# Patient Record
Sex: Male | Born: 1945
Health system: Southern US, Community
[De-identification: ages and names within clinical notes are randomized; demographics above are authoritative.]

## PROBLEM LIST (undated history)

## (undated) DIAGNOSIS — Z9889 Other specified postprocedural states: Secondary | ICD-10-CM

## (undated) DIAGNOSIS — S82209A Unspecified fracture of shaft of unspecified tibia, initial encounter for closed fracture: Secondary | ICD-10-CM

## (undated) DIAGNOSIS — G709 Myoneural disorder, unspecified: Secondary | ICD-10-CM

## (undated) DIAGNOSIS — S82409A Unspecified fracture of shaft of unspecified fibula, initial encounter for closed fracture: Secondary | ICD-10-CM

## (undated) DIAGNOSIS — I4510 Unspecified right bundle-branch block: Secondary | ICD-10-CM

## (undated) DIAGNOSIS — G4733 Obstructive sleep apnea (adult) (pediatric): Secondary | ICD-10-CM

## (undated) DIAGNOSIS — E785 Hyperlipidemia, unspecified: Secondary | ICD-10-CM

## (undated) DIAGNOSIS — K219 Gastro-esophageal reflux disease without esophagitis: Secondary | ICD-10-CM

## (undated) DIAGNOSIS — M1990 Unspecified osteoarthritis, unspecified site: Secondary | ICD-10-CM

## (undated) DIAGNOSIS — R112 Nausea with vomiting, unspecified: Secondary | ICD-10-CM

## (undated) DIAGNOSIS — R55 Syncope and collapse: Secondary | ICD-10-CM

## (undated) DIAGNOSIS — R001 Bradycardia, unspecified: Secondary | ICD-10-CM

## (undated) DIAGNOSIS — G1223 Primary lateral sclerosis: Secondary | ICD-10-CM

## (undated) DIAGNOSIS — K760 Fatty (change of) liver, not elsewhere classified: Secondary | ICD-10-CM

## (undated) DIAGNOSIS — I10 Essential (primary) hypertension: Secondary | ICD-10-CM

## (undated) HISTORY — DX: Gastro-esophageal reflux disease without esophagitis: K21.9

## (undated) HISTORY — DX: Unspecified right bundle-branch block: I45.10

## (undated) HISTORY — DX: Fatty (change of) liver, not elsewhere classified: K76.0

## (undated) HISTORY — DX: Obstructive sleep apnea (adult) (pediatric): G47.33

## (undated) HISTORY — DX: Syncope and collapse: R55

## (undated) HISTORY — DX: Bradycardia, unspecified: R00.1

## (undated) HISTORY — DX: Primary lateral sclerosis: G12.23

## (undated) HISTORY — DX: Essential (primary) hypertension: I10

## (undated) HISTORY — DX: Hyperlipidemia, unspecified: E78.5

## (undated) HISTORY — PX: EYE SURGERY: SHX253

---

## 1975-04-23 HISTORY — PX: SHOULDER SURGERY: SHX246

## 2001-12-21 HISTORY — PX: HERNIA REPAIR: SHX51

## 2005-09-17 ENCOUNTER — Ambulatory Visit: Payer: Self-pay | Admitting: Internal Medicine

## 2005-10-09 ENCOUNTER — Ambulatory Visit (HOSPITAL_COMMUNITY): Admission: RE | Admit: 2005-10-09 | Discharge: 2005-10-09 | Payer: Self-pay | Admitting: Orthopedic Surgery

## 2005-10-22 ENCOUNTER — Ambulatory Visit: Payer: Self-pay | Admitting: Internal Medicine

## 2005-11-27 ENCOUNTER — Encounter: Admission: RE | Admit: 2005-11-27 | Discharge: 2005-11-27 | Payer: Self-pay | Admitting: Internal Medicine

## 2006-04-10 ENCOUNTER — Ambulatory Visit (HOSPITAL_COMMUNITY): Admission: RE | Admit: 2006-04-10 | Discharge: 2006-04-10 | Payer: Self-pay | Admitting: Family Medicine

## 2008-02-09 ENCOUNTER — Encounter: Payer: Self-pay | Admitting: Family Medicine

## 2008-08-30 ENCOUNTER — Ambulatory Visit (HOSPITAL_COMMUNITY): Admission: RE | Admit: 2008-08-30 | Discharge: 2008-08-30 | Payer: Self-pay | Admitting: General Surgery

## 2008-11-20 HISTORY — PX: FOOT SURGERY: SHX648

## 2009-03-07 ENCOUNTER — Encounter: Payer: Self-pay | Admitting: Family Medicine

## 2009-08-02 ENCOUNTER — Ambulatory Visit: Payer: Self-pay | Admitting: Family Medicine

## 2009-08-02 DIAGNOSIS — G1221 Amyotrophic lateral sclerosis: Secondary | ICD-10-CM | POA: Insufficient documentation

## 2009-08-02 DIAGNOSIS — E7800 Pure hypercholesterolemia, unspecified: Secondary | ICD-10-CM | POA: Insufficient documentation

## 2009-08-02 DIAGNOSIS — I10 Essential (primary) hypertension: Secondary | ICD-10-CM | POA: Insufficient documentation

## 2009-08-02 DIAGNOSIS — Z9189 Other specified personal risk factors, not elsewhere classified: Secondary | ICD-10-CM | POA: Insufficient documentation

## 2009-08-02 DIAGNOSIS — K219 Gastro-esophageal reflux disease without esophagitis: Secondary | ICD-10-CM | POA: Insufficient documentation

## 2009-08-02 LAB — HM COLONOSCOPY

## 2009-08-09 LAB — CONVERTED CEMR LAB
ALT: 28 units/L (ref 0–53)
AST: 26 units/L (ref 0–37)
Albumin: 4.6 g/dL (ref 3.5–5.2)
BUN: 17 mg/dL (ref 6–23)
Cholesterol: 281 mg/dL — ABNORMAL HIGH (ref 0–200)
Creatinine, Ser: 1.2 mg/dL (ref 0.4–1.5)
GFR calc non Af Amer: 64.46 mL/min (ref >60)
Glucose, Bld: 99 mg/dL (ref 70–99)
Potassium: 4.5 meq/L (ref 3.5–5.1)
VLDL: 47.2 mg/dL — ABNORMAL HIGH (ref 0.0–40.0)

## 2009-10-26 ENCOUNTER — Ambulatory Visit: Payer: Self-pay | Admitting: Family Medicine

## 2009-10-30 LAB — CONVERTED CEMR LAB
ALT: 24 units/L (ref 0–53)
HDL: 38.1 mg/dL — ABNORMAL LOW (ref >39.00)
Total CHOL/HDL Ratio: 6
Triglycerides: 258 mg/dL — ABNORMAL HIGH (ref 0.0–149.0)

## 2009-11-03 ENCOUNTER — Ambulatory Visit: Payer: Self-pay | Admitting: Family Medicine

## 2009-11-03 DIAGNOSIS — R1011 Right upper quadrant pain: Secondary | ICD-10-CM

## 2010-02-09 ENCOUNTER — Ambulatory Visit: Payer: Self-pay | Admitting: Family Medicine

## 2010-02-09 DIAGNOSIS — T148XXA Other injury of unspecified body region, initial encounter: Secondary | ICD-10-CM | POA: Insufficient documentation

## 2010-02-09 LAB — CONVERTED CEMR LAB
ALT: 29 units/L (ref 0–53)
AST: 29 units/L (ref 0–37)
Basophils Relative: 1 % (ref 0.0–3.0)
Bilirubin, Direct: 0.1 mg/dL (ref 0.0–0.3)
Chloride: 108 meq/L (ref 96–112)
Eosinophils Absolute: 0.4 10*3/uL (ref 0.0–0.7)
Hemoglobin: 15 g/dL (ref 13.0–17.0)
LDL Cholesterol: 113 mg/dL — ABNORMAL HIGH (ref 0–99)
Lymphocytes Relative: 34 % (ref 12.0–46.0)
MCHC: 34.7 g/dL (ref 30.0–36.0)
MCV: 96.8 fL (ref 78.0–100.0)
Neutro Abs: 3 10*3/uL (ref 1.4–7.7)
Potassium: 4.6 meq/L (ref 3.5–5.1)
Prothrombin Time: 9.8 s (ref 9.7–11.8)
RBC: 4.46 M/uL (ref 4.22–5.81)
Total Bilirubin: 0.4 mg/dL (ref 0.3–1.2)
Total CHOL/HDL Ratio: 5

## 2010-02-13 ENCOUNTER — Encounter: Payer: Self-pay | Admitting: Family Medicine

## 2010-02-13 ENCOUNTER — Ambulatory Visit: Payer: Self-pay | Admitting: Family Medicine

## 2010-02-13 DIAGNOSIS — R55 Syncope and collapse: Secondary | ICD-10-CM

## 2010-02-13 DIAGNOSIS — I498 Other specified cardiac arrhythmias: Secondary | ICD-10-CM

## 2010-02-13 LAB — CONVERTED CEMR LAB: HDL goal, serum: 40 mg/dL

## 2010-02-15 ENCOUNTER — Encounter: Admission: RE | Admit: 2010-02-15 | Discharge: 2010-02-15 | Payer: Self-pay | Admitting: Family Medicine

## 2010-02-16 ENCOUNTER — Encounter (INDEPENDENT_AMBULATORY_CARE_PROVIDER_SITE_OTHER): Payer: Self-pay | Admitting: *Deleted

## 2010-03-01 ENCOUNTER — Ambulatory Visit: Payer: Self-pay | Admitting: Family Medicine

## 2010-03-01 DIAGNOSIS — R059 Cough, unspecified: Secondary | ICD-10-CM | POA: Insufficient documentation

## 2010-03-01 DIAGNOSIS — R05 Cough: Secondary | ICD-10-CM

## 2010-03-07 ENCOUNTER — Encounter: Payer: Self-pay | Admitting: Family Medicine

## 2010-03-26 ENCOUNTER — Telehealth: Payer: Self-pay | Admitting: Family Medicine

## 2010-03-26 DIAGNOSIS — I4510 Unspecified right bundle-branch block: Secondary | ICD-10-CM

## 2010-05-22 NOTE — Progress Notes (Signed)
Summary: wants referral to cardio  Phone Note Call from Patient Call back at Home Phone [phone]   Caller: Spouse Call For: [name] MD Summary of Call: Pt's wife states pt is ready to be referred to cardio.  This was discussed at his office visit in october.  He prefers to go to [facility] cardio in [place]. Appt after the first of february is fine. Initial call taken by: Lowella Petties CMA, AAMA,  March 26, 2010 3:57 PM  Follow-up for Phone Call        Patients number disconnected will advise when patient calls back.Consuello Masse CMA   Follow-up by: Benny Lennert CMA Duncan Dull),  March 27, 2010 10:47 AM  New Problems: RIGHT BUNDLE BRANCH BLOCK (ICD-426.4)   New Problems: RIGHT BUNDLE BRANCH BLOCK (ICD-426.4)

## 2010-05-22 NOTE — Assessment & Plan Note (Signed)
Summary: 3 M F/U HTN/DLO   Vital Signs:  Patient profile:   65 year old male Weight:      241 pounds Temp:     98.5 degrees F oral Pulse rate:   56 / minute Pulse rhythm:   regular BP sitting:   120 / 70  (left arm) Cuff size:   large  Vitals Entered By: Janee Morn CMA (November 03, 2009 8:45 AM) CC: 3 month f/u; check bruises and abd pain   History of Present Illness: HTN, well controlled on lisinopril/HCTZ  High cholesterol: LDL improved on low dose pravastatin.  No current SE.  Intermittant RUQ abdominal pain after meals.[name] last 20-30 minutes. 2-3/10...Marland Kitchenoccuring every few days.Marland KitchenMarland Kitchen3-4 times a week. Ongoing for last year...some increase in frequency. Still has gallbladder. No diarrhea, no constipation, No blood in stools.  PLS.Marland Kitchenper neuro...some peircing headaches  (neg test for temporal arteritis/trigeminal neuralgia)...started on indocin.  Problems Prior to Update: 1)  Als  (ICD-335.20) 2)  Family History Diabetes 1st Degree Relative  (ICD-V18.0) 3)  Hypercholesterolemia  (ICD-272.0) 4)  Hypertension, Mild  (ICD-401.1) 5)  Gerd  (ICD-530.81) 6)  Chickenpox, Hx of  (ICD-V15.9)  Current Medications (verified): 1)  Lisinopril-Hydrochlorothiazide 20-12.5 Mg Tabs (Lisinopril-Hydrochlorothiazide) .[name].. 1 Tab By Mouth Daily 2)  Tizanidine Hcl 4 Mg Tabs (Tizanidine Hcl) .... Once Daily 3)  Robaxin 500 Mg Tabs (Methocarbamol) .... 2 Tablets Once Daily 4)  Prilosec Otc 20 Mg Tbec (Omeprazole Magnesium) .... Once Daily 5)  Pravastatin Sodium 20 Mg Tabs (Pravastatin Sodium) .[name].. 1 Tab By Mouth Daily  Allergies (verified): No Known Drug Allergies  Past History:  Past medical, surgical, family and social histories (including risk factors) reviewed, and no changes noted (except as noted below).  Past Medical History: Reviewed history from 08/02/2009 and no changes required. Current Problems:  HYPERCHOLESTEROLEMIA (ICD-272.0) HYPERTENSION, MILD (ICD-401.1) GERD  (ICD-530.81) CHICKENPOX, HX OF (ICD-V15.9)    Past Surgical History: Reviewed history from 08/02/2009 and no changes required. repair Luxating shoulder 1977 (left) Bilateral Inguinal hernia repair 12-2001 foot surgery (left)11-2008..hammer toe and bunion.  Family History: Reviewed history from 08/02/2009 and no changes required. Family History of Arthritis Family History Diabetes 2nd degree relative Family History High cholesterol Family History Hypertension Family History of Prostate CA 1st degree relative <50 Family History of Cardiovascular disorder Dad: prostate cancer, HTN, malignant, renal failure, TIA Mom: arrythmia.Marland Kitchenatrial fibrillation? sister: HTN  Social History: Reviewed history from 08/02/2009 and no changes required. Retired Married Never Smoked Alcohol use-one beverage every 2 weeks. Drug use-no Regular exercise-yes..walking to mail box Diet: fruits and veggies, water  Review of Systems       easy bruising in last 6 months. General:  Denies fatigue, fever, and malaise. CV:  Denies chest pain or discomfort. Resp:  Denies shortness of breath. GI:  Complains of abdominal pain; denies bloody stools. GU:  Denies dysuria.  Physical Exam  General:  Overweght appearing male in NAd Mouth:  MMM Neck:  no carotid bruit or thyromegaly no cervical or supraclavicular lymphadenopathy  Lungs:  Normal respiratory effort, chest expands symmetrically. Lungs are clear to auscultation, no crackles or wheezes. Heart:  Normal rate and regular rhythm. S1 and S2 normal without gallop, murmur, click, rub or other extra sounds. Abdomen:  Bowel sounds positive,abdomen soft and non-tender without masses, organomegaly or hernias noted. Pulses:  R and L posterior tibial pulses are full and equal bilaterally  Extremities:  no edema  Neurologic:  hyperreflexia in patela, diffuse 4 1/2 /5 strength alert &  oriented X3, cranial nerves II-XII intact, sensation intact to light touch, and  sensation intact to pinprick.   Slow unsteady gait. Skin:  Intact without suspicious lesions or rashes   Impression & Recommendations:  Problem # 1:  HYPERCHOLESTEROLEMIA (ICD-272.0) Improved, but not at goal.  he is currently not interested in increasing pravastatin due to past intolerance of statins. Will continue at current dose. Increase fish oil to improved triglycerides. Recheck in 3 months. His updated medication list for this problem includes:    Pravastatin Sodium 20 Mg Tabs (Pravastatin sodium) .[name]... 1 tab by mouth daily  Labs Reviewed: SGOT: 23 (10/26/2009)   SGPT: 24 (10/26/2009)  Prior 10 Yr Risk Heart Disease: Not enough information (08/02/2009)   HDL:38.10 (10/26/2009), 39 (08/21/2009)  LDL:129 (08/21/2009)  Chol:212 (10/26/2009), 216 (08/21/2009)  Trig:258.0 (10/26/2009), 239 (08/21/2009)  Problem # 2:  HYPERTENSION, MILD (ICD-401.1) Well controlled. Continue current medication.  His updated medication list for this problem includes:    Lisinopril-hydrochlorothiazide 20-12.5 Mg Tabs (Lisinopril-hydrochlorothiazide) .[name]... 1 tab by mouth daily  BP today: 120/70 Prior BP: 120/80 (08/02/2009)  Prior 10 Yr Risk Heart Disease: Not enough information (08/02/2009)  Labs Reviewed: K+: 4.9 (08/21/2009) Creat: : 1.10 (08/21/2009)   Chol: 212 (10/26/2009)   HDL: 38.10 (10/26/2009)   LDL: 129 (08/21/2009)   TG: 258.0 (10/26/2009)  Problem # 3:  ABDOMINAL PAIN, RIGHT UPPER QUADRANT (ICD-789.01) Msot concerning for gallbladder issue. PAin bothers him minimally ands he is not current;y interested in [place] or further lab work up. he has been instructed if pain last greater than 3 hours, fever and vomiting to go to ER. Call if abdmoinal pain increase in frequecny and we will begin eval.   Complete Medication List: 1)  Lisinopril-hydrochlorothiazide 20-12.5 Mg Tabs (Lisinopril-hydrochlorothiazide) .[name].. 1 tab by mouth daily 2)  Tizanidine Hcl 4 Mg Tabs (Tizanidine hcl) .... Once  daily 3)  Robaxin 500 Mg Tabs (Methocarbamol) .... 2 tablets once daily 4)  Prilosec Otc 20 Mg Tbec (Omeprazole magnesium) .... Once daily 5)  Pravastatin Sodium 20 Mg Tabs (Pravastatin sodium) .[name].. 1 tab by mouth daily 6)  Fish Oil Oil (Fish oil)  Patient Instructions: 1)  Fish oil 2000-4000 mg divided daily 2)  DHA and EPA.Marland Kitchenomega 3... or ALA in flax seed. 3)  Continue current dose of pravastatin. 4)  Recheck fasting LIPIDS, CMET, PT, PTT, cbc diff  in 3 months Dx 272.0    5)  Please schedule a follow-up appointment in 3 months .

## 2010-05-22 NOTE — Letter (Signed)
Summary: [facility]   Imported By: Lanelle Bal 08/10/2009 08:42:31  _____________________________________________________________________  External Attachment:    Type:   Image     Comment:   External Document

## 2010-05-22 NOTE — Assessment & Plan Note (Signed)
Summary: COUGH/CLE   Vital Signs:  Patient profile:   65 year old male Weight:      250.25 pounds O2 Sat:      98 % on Room air Temp:     98.1 degrees F oral Pulse rate:   60 / minute Pulse rhythm:   regular BP sitting:   122 / 82  (left arm) Cuff size:   large  Vitals Entered By: Selena Batten Dance CMA Duncan Dull) (March 01, 2010 11:46 AM)  O2 Flow:  Room air CC: Cough x1 week (Deep/dry)   History of Present Illness: CC: cough  1 wk h/o severe hacking cough.  Very little coming up.  Deep, dry hacking.  Not yellow.  mostly clear mucous from nose.  + nsaal congestion.  + started with ST.  coughing causing abd soreness.  Tried cough drops.  Delsym doesn't help.  + chills, mild HA, attributed to sinuses.  More fatigue.  + PNDrip  No n/v/d, myalgias or arthralgias, rashes.  no fever.  appetite ok.  drinking ok.  + h/o primary lateral sclerosis (upper motor neuron).  No smokers at home.  No sick contacts at home.  does note has had intermittent cough since starting ACEI.  didn't take ACEI this am.  no h/o asthma, COPD.  hay fever as child.  Current Medications (verified): 1)  Lisinopril-Hydrochlorothiazide 20-12.5 Mg Tabs (Lisinopril-Hydrochlorothiazide) .[name].. 1 Tab By Mouth Daily 2)  Tizanidine Hcl 4 Mg Tabs (Tizanidine Hcl) .... Once Daily 3)  Robaxin 500 Mg Tabs (Methocarbamol) .... 2 Tablets Once Daily 4)  Prilosec Otc 20 Mg Tbec (Omeprazole Magnesium) .... Once Daily 5)  Pravastatin Sodium 20 Mg Tabs (Pravastatin Sodium) .[name].. 1 Tab By Mouth Daily 6)  Fish Oil  Oil (Fish Oil) .... Take 1200 Mg's Two Times A Day 7)  Indomethacin 25 Mg Caps (Indomethacin) .... Take One By Mouth Two Times A Day 8)  Cranberry 300 Mg Tabs (Cranberry) .[name].. 1 By Mouth Once Daily 9)  Vitamin D3 3000 Unit Tabs (Cholecalciferol) .[name].. 1 By Mouth Once Daily 10)  Calcium-Vitamin D 600-200 Mg-Unit Tabs (Calcium-Vitamin D) .[name].. 1 By Mouth Once Daily  Allergies (verified): No Known Drug Allergies  Past  History:  Social History: Last updated: 08/02/2009 Retired Married Never Smoked Alcohol use-one beverage every 2 weeks. Drug use-no Regular exercise-yes..walking to mail box Diet: fruits and veggies, water  Past Medical History: Current Problems:  HYPERCHOLESTEROLEMIA (ICD-272.0) HYPERTENSION, MILD (ICD-401.1) GERD (ICD-530.81) CHICKENPOX, HX OF (ICD-V15.9)  PLS (Dr. Isabelle Course)  Review of Systems       per HPI  Physical Exam  General:  Overweght appearing male in NAD Head:  Normocephalic and atraumatic without obvious abnormalities. No apparent alopecia or balding. Eyes:  No corneal or conjunctival inflammation noted. EOMI. Perrla. Ears:  TM clear Nose:  External nasal examination shows no deformity or inflammation. Nasal mucosa are pink and moist without lesions or exudates. Mouth:  MMM Neck:  no carotid bruit or thyromegaly no cervical or supraclavicular lymphadenopathy  Lungs:  Normal respiratory effort, chest expands symmetrically. Lungs are clear to auscultation, no crackles or wheezes. Heart:  Normal rate and regular rhythm. S1 and S2 normal without gallop, murmur, click, rub or other extra sounds. Abdomen:  Bowel sounds positive,abdomen soft and non-tender without masses, organomegaly or hernias noted. Pulses:  2+ rad pulses Extremities:  no pitting edema Skin:  Intact without suspicious lesions or rashes   Impression & Recommendations:  Problem # 1:  COUGH (ICD-786.2) likely viral,  possible early bronchitis.  rec supoprtive care.  to call if not improving as expected.  to return if wosrening( red flags discussed).  tussionex, if too expensive then use cheratussin.  Complete Medication List: 1)  Lisinopril-hydrochlorothiazide 20-12.5 Mg Tabs (Lisinopril-hydrochlorothiazide) .[name].. 1 tab by mouth daily 2)  Tizanidine Hcl 4 Mg Tabs (Tizanidine hcl) .... Once daily 3)  Robaxin 500 Mg Tabs (Methocarbamol) .... 2 tablets once daily 4)  Prilosec Otc 20 Mg Tbec  (Omeprazole magnesium) .... Once daily 5)  Pravastatin Sodium 20 Mg Tabs (Pravastatin sodium) .[name].. 1 tab by mouth daily 6)  Fish Oil Oil (Fish oil) .... Take 1200 mg's two times a day 7)  Indomethacin 25 Mg Caps (Indomethacin) .... Take one by mouth two times a day 8)  Cranberry 300 Mg Tabs (Cranberry) .[name].. 1 by mouth once daily 9)  Vitamin D3 3000 Unit Tabs (Cholecalciferol) .[name].. 1 by mouth once daily 10)  Calcium-vitamin D 600-200 Mg-unit Tabs (Calcium-vitamin d) .[name].. 1 by mouth once daily 11)  Cheratussin Ac 100-10 Mg/93ml Syrp (Guaifenesin-codeine) .... One teaspoon every 4-6 hours as needed cough 12)  Tussionex Pennkinetic Er 10-8 Mg/37ml Lqcr (Hydrocod polst-chlorphen polst) .... One teaspoon twice daily as needed cough  Patient Instructions: 1)  Sounds like you have a viral upper respiratory infection. 2)  Antibiotics are not needed for this.  Viral infections usually take 7-10 days to resolve.  The cough can last up to 4 weeks to go away. 3)  Use medication as prescribed: tussionex, if too expensive then cheratussin.   4)  Push fluids and plenty of rest.  Nasal saline spray. 5)  Please return if you are not improving as expected, or if you have high fevers (>101.5) or difficulty breathing. 6)  Call clinic with questions.  Pleasure to see you today!  Prescriptions: Sandria Senter ER 10-8 MG/5ML LQCR (HYDROCOD POLST-CHLORPHEN POLST) one teaspoon twice daily as needed cough  #100cc x 0   Entered and Authorized by:   Eustaquio Boyden  MD   Signed by:   Eustaquio Boyden  MD on 03/01/2010   Method used:   Print then Give to Patient   RxID:   [id number] CHERATUSSIN AC 100-10 MG/5ML SYRP (GUAIFENESIN-CODEINE) one teaspoon every 4-6 hours as needed cough  #100cc x 0   Entered and Authorized by:   Eustaquio Boyden  MD   Signed by:   Eustaquio Boyden  MD on 03/01/2010   Method used:   Print then Give to Patient   RxID:   [id number]    Orders Added: 1)  Est. Patient Level  III [84696]    Current Allergies (reviewed today): No known allergies

## 2010-05-22 NOTE — Letter (Signed)
Summary: Records from [facility] 02/21/10 - 03/07/10  Records from [facility] 02/21/10 - 03/07/10   Imported By: Maryln Gottron 03/19/2010 12:20:16  _____________________________________________________________________  External Attachment:    Type:   Image     Comment:   External Document  Appended Document: Records from [facility] 02/21/10 - 03/07/10 Notify pt that I reviewed the note sent from the [facility].Marland Kitchenif he wishes to proceed with their recommendations (unless they have already made arrangements) please have him make an appt or we can discuss at next  OV. Make next appt please.    Appended Document: Records from [facility] 02/21/10 - 03/07/10 Patients wife advised and she will have patient call when back in town for appt b/c patients mother is ill.Consuello Masse CMA

## 2010-05-22 NOTE — Assessment & Plan Note (Signed)
Summary: ROA FOR 3 MONTH FOLLOW-UP/JRR   Vital Signs:  Patient profile:   65 year old male Height:      72 inches Weight:      251.0 pounds BMI:     34.16 Temp:     98.6 degrees F oral Pulse rate:   62 / minute Pulse rhythm:   regular BP sitting:   110 / 72  (left arm) Cuff size:   large  Vitals Entered By: Benny Lennert CMA Duncan Dull) (February 13, 2010 8:17 AM)  History of Present Illness: Chief complaint 3 month follow up  High cholesterol: on same dose pravastatin (intolerant of SE on higher doses in past) and higher dose fish oil.   HTN, well controlled on current meds. Well controlled at home as well.  About 1 month ago..stood up went into bathroom.  In middle of night...does take muscle relaxants at night.  Out of balance with PLS.  No proceding symptoms..no arrythmia.   Lipid Management History:      Positive NCEP/ATP III risk factors include male age 74 years old or older, HDL cholesterol less than 40, and hypertension.  Negative NCEP/ATP III risk factors include non-tobacco-user status.     Allergies (verified): No Known Drug Allergies  Review of Systems General:  Denies fatigue and fever. CV:  Denies chest pain or discomfort, fatigue, and swelling of feet. Resp:  Denies shortness of breath. GI:  Complains of abdominal pain and nausea; denies indigestion and vomiting; Continuing RUQ. GU:  Denies dysuria.  Physical Exam  General:  Overweght appearing male in NAD Mouth:  MMM Neck:  no carotid bruit or thyromegaly no cervical or supraclavicular lymphadenopathy  Lungs:  Normal respiratory effort, chest expands symmetrically. Lungs are clear to auscultation, no crackles or wheezes. Heart:  Normal rate and regular rhythm. S1 and S2 normal without gallop, murmur, click, rub or other extra sounds. Abdomen:  Bowel sounds positive,abdomen soft and non-tender without masses, organomegaly or hernias noted. Pulses:  R and L posterior tibial pulses are full and equal  bilaterally  Extremities:  trace left pedal edema and trace right pedal edema.   Skin:  Intact without suspicious lesions or rashes Psych:  Oriented X3 and good eye contact.    Mild depressed affect due to health issues   Impression & Recommendations:  Problem # 1:  ABDOMINAL PAIN, RIGHT UPPER QUADRANT (ICD-789.01) Eval with [place] for gallstones. See last OV for deatils of symptms  Orders: Radiology Referral (Radiology)  Problem # 2:  HYPERCHOLESTEROLEMIA (ICD-272.0) Improved. LDL at goal, Triglycerides improved. Increase fish oil to 3000 mg daily. Encouraged exercise, weight loss, healthy eating habits.  His updated medication list for this problem includes:    Pravastatin Sodium 20 Mg Tabs (Pravastatin sodium) .[name]... 1 tab by mouth daily  Problem # 3:  HYPERTENSION, MILD (ICD-401.1) Well controlled. Continue current medication.  His updated medication list for this problem includes:    Lisinopril-hydrochlorothiazide 20-12.5 Mg Tabs (Lisinopril-hydrochlorothiazide) .[name]... 1 tab by mouth daily  Problem # 4:  SYNCOPE (ICD-780.2) Likly due to medications at night, orthostasis.Marland Kitchenno clear heart issues or proceeding symptms.  EKG (none for comparison) showed...H Rate 53, Diffuse low voltage, -Incomplete right bundle branch block.  Will get old EKG for comparison, but likely will need referral for eval to cardiologist.   Orders: EKG w/ Interpretation (93000)  Problem # 5:  BRADYCARDIA (ICD-427.89)  Orders: TLB-TSH (Thyroid Stimulating Hormone) (84443-TSH)  Complete Medication List: 1)  Lisinopril-hydrochlorothiazide 20-12.5 Mg Tabs (Lisinopril-hydrochlorothiazide) .[name].. 1 tab  by mouth daily 2)  Tizanidine Hcl 4 Mg Tabs (Tizanidine hcl) .... Once daily 3)  Robaxin 500 Mg Tabs (Methocarbamol) .... 2 tablets once daily 4)  Prilosec Otc 20 Mg Tbec (Omeprazole magnesium) .... Once daily 5)  Pravastatin Sodium 20 Mg Tabs (Pravastatin sodium) .[name].. 1 tab by mouth daily 6)  Fish Oil Oil (Fish  oil)  Other Orders: Flu Vaccine 26yrs + MEDICARE PATIENTS (W1191) Administration Flu vaccine - MCR (G0008)  Lipid Assessment/Plan:      Based on NCEP/ATP III, the patient's risk factor category is "2 or more risk factors and a calculated 10 year CAD risk of < 20%".  [name] lipid goals are as follows: Total cholesterol goal is 200; LDL cholesterol goal is 130; HDL cholesterol goal is 40; Triglyceride goal is 150.  His LDL cholesterol goal has been met.    Patient Instructions: 1)  INcrease fish oil to 4 cap by mouth two times a day. 2)  Please schedule a follow-up appointment in 6 months  CPX 3)  BMP prior to visit, ICD-9:  4)  Hepatic Panel prior to visit ICD-9:  5)  Lipid panel prior to visit ICD-9 :  6)  Referral Appointment Information 7)  Day/Date: 8)  Time: 9)  Place/MD: 10)  Address: 11)  Phone/Fax: 12)  Patient given appointment information. Information/Orders faxed/mailed.    Orders Added: 1)  Flu Vaccine 48yrs + MEDICARE PATIENTS [Q2039] 2)  Administration Flu vaccine - MCR [G0008] 3)  TLB-TSH (Thyroid Stimulating Hormone) [84443-TSH] 4)  Radiology Referral [Radiology] 5)  EKG w/ Interpretation [93000] 6)  Est. Patient Level IV [47829]    Current Allergies (reviewed today): No known allergies     Flu Vaccine Consent Questions     Do you have a history of severe allergic reactions to this vaccine? no    Any prior history of allergic reactions to egg and/or gelatin? no    Do you have a sensitivity to the preservative Thimersol? no    Do you have a past history of Guillan-Barre Syndrome? no    Do you currently have an acute febrile illness? no    Have you ever had a severe reaction to latex? no    Vaccine information given and explained to patient? yes    Are you currently pregnant? no    Lot Number:[id number]   Exp Date:10/20/2010   Site Given  Left Deltoid [id number]   Last Flu Vaccine:  given (01/20/2009 11:01:16 AM) Flu Vaccine Result Date:   02/13/2010 Flu Vaccine Result:  given Flu Vaccine Next Due:  1 yr Hemoccult Next Due:  Not Indicated  Appended Document: ROA FOR 3 MONTH FOLLOW-UP/JRR Notif ypt that EKG compared to last one done in 01/2008..no significant change except slower heart rate. No referral needed to cardiology at this time. call if further syncopal spells.   Appended Document: ROA FOR 3 MONTH FOLLOW-UP/JRR Patient advised via message on machine.Consuello Masse CMA

## 2010-05-22 NOTE — Miscellaneous (Signed)
Summary: med list update  Medications Added FISH OIL  OIL (FISH OIL) take 1200 mg's two times a day INDOMETHACIN 25 MG CAPS (INDOMETHACIN) take one by mouth two times a day       Clinical Lists Changes  Medications: Changed medication from FISH OIL  OIL (FISH OIL) to FISH OIL  OIL (FISH OIL) take 1200 mg's two times a day Added new medication of INDOMETHACIN 25 MG CAPS (INDOMETHACIN) take one by mouth two times a day     Prior Medications: LISINOPRIL-HYDROCHLOROTHIAZIDE 20-12.5 MG TABS (LISINOPRIL-HYDROCHLOROTHIAZIDE) 1 tab by mouth daily TIZANIDINE HCL 4 MG TABS (TIZANIDINE HCL) once daily ROBAXIN 500 MG TABS (METHOCARBAMOL) 2 tablets once daily PRILOSEC OTC 20 MG TBEC (OMEPRAZOLE MAGNESIUM) once daily PRAVASTATIN SODIUM 20 MG TABS (PRAVASTATIN SODIUM) 1 tab by mouth daily INDOMETHACIN 25 MG CAPS (INDOMETHACIN) take one by mouth two times a day Current Allergies: No known allergies

## 2010-05-22 NOTE — Assessment & Plan Note (Signed)
Summary: NEW PATIENT, EST.   Vital Signs:  Patient profile:   65 year old male Height:      72 inches Weight:      242.2 pounds BMI:     32.97 Temp:     97.9 degrees F oral Pulse rate:   68 / minute Pulse rhythm:   regular BP sitting:   120 / 80  (left arm) Cuff size:   regular  Vitals Entered By: Benny Lennert CMA Duncan Dull) (August 02, 2009 10:05 AM)  History of Present Illness: Chief complaint new patient to be established   Diagnosed with PLS in 2003.Edward Kitchenafter weakness. Sees Dr. Macario Carls at [facility]. Rheum eval negative. Ortho eval negative. Neuro eval..[facility]. [facility]..Dr. Antonietta Jewel runs. On 2 muscle relaxants for symtoms.  DOE and pain may be due to PLS.   Last saw primary MD in 02/2009 Dr. Phillips Odor Had CPX at that time..but he felt only 3 minutes spents. EKG nml then.  High cholesterol..was on welchol but felt caused abdominal pain...? mild pancreatitis. Has had mild muscle pain on simvastin in past..? but that may have been due to PLS.   Hypertension History:      He complains of dyspnea with exertion, but denies headache, chest pain, palpitations, orthopnea, PND, syncope, and side effects from treatment.  Well controlled 115/70.        Positive major cardiovascular risk factors include male age 24 years old or older, hyperlipidemia, and hypertension.  Negative major cardiovascular risk factors include non-tobacco-user status.     Preventive Screening-Counseling & Management  Alcohol-Tobacco     Smoking Status: never  Caffeine-Diet-Exercise     Does Patient Exercise: yes      Drug Use:  no.    Allergies (verified): No Known Drug Allergies  Past History:  Past Medical History: Current Problems:  HYPERCHOLESTEROLEMIA (ICD-272.0) HYPERTENSION, MILD (ICD-401.1) GERD (ICD-530.81) CHICKENPOX, HX OF (ICD-V15.9)    Past Surgical History: repair Luxating shoulder 1977 (left) Bilateral Inguinal hernia repair 12-2001 foot surgery  (left)11-2008..hammer toe and bunion.  Family History: Family History of Arthritis Family History Diabetes 2nd degree relative Family History High cholesterol Family History Hypertension Family History of Prostate CA 1st degree relative <50 Family History of Cardiovascular disorder Dad: prostate cancer, HTN, malignant, renal failure, TIA Mom: arrythmia.Edward Kitchenatrial fibrillation? sister: HTN  Social History: Retired Married Never Smoked Alcohol use-one beverage every 2 weeks. Drug use-no Regular exercise-yes..walking to mail box Diet: fruits and veggies, water Smoking Status:  never Drug Use:  no Does Patient Exercise:  yes  Physical Exam  General:  Overweihgt appearing male inNAd Ears:  External ear exam shows no significant lesions or deformities.  Otoscopic examination reveals clear canals, tympanic membranes are intact bilaterally without bulging, retraction, inflammation or discharge. Hearing is grossly normal bilaterally. Nose:  External nasal examination shows no deformity or inflammation. Nasal mucosa are pink and moist without lesions or exudates. Mouth:  MMM Neck:  no carotid bruit or thyromegaly no cervical or supraclavicular lymphadenopathy  Lungs:  Normal respiratory effort, chest expands symmetrically. Lungs are clear to auscultation, no crackles or wheezes. Heart:  Normal rate and regular rhythm. S1 and S2 normal without gallop, murmur, click, rub or other extra sounds. Abdomen:  Bowel sounds positive,abdomen soft and non-tender without masses, organomegaly or hernias noted. Pulses:  R and L posterior tibial pulses are full and equal bilaterally  Extremities:  no edema  Neurologic:  hyperreflexia in patela, diffuse 4 1/2 /5 strengthalert & oriented  X3, cranial nerves II-XII intact, sensation intact to light touch, and sensation intact to pinprick.   Slow unsteady gait. Skin:  Intact without suspicious lesions or rashes Psych:  Cognition and judgment appear intact.  Alert and cooperative with normal attention span and concentration. No apparent delusions, illusions, hallucinations   Impression & Recommendations:  Problem # 1:  ALS (ICD-335.20) PLS more specifically.Edward Kitchena varient of ALS. Sees [facility] specialist. Obtain records from previous and current MDs.  Problem # 2:  HYPERCHOLESTEROLEMIA (ICD-272.0) Due for reeval. if elevated willing to t pravastain low dose and titrate up.  Orders: TLB-BMP (Basic Metabolic Panel-BMET) (80048-METABOL) TLB-Hepatic/Liver Function Pnl (80076-HEPATIC) TLB-Lipid Panel (80061-LIPID)  Problem # 3:  HYPERTENSION, MILD (ICD-401.1) Well controlled. Continue current medication.  His updated medication list for this problem includes:    Lisinopril-hydrochlorothiazide 20-12.5 Mg Tabs (Lisinopril-hydrochlorothiazide) .Edward Frank... 1 tab by mouth daily  Complete Medication List: 1)  Lisinopril-hydrochlorothiazide 20-12.5 Mg Tabs (Lisinopril-hydrochlorothiazide) .Edward Frank.. 1 tab by mouth daily 2)  Tizanidine Hcl 4 Mg Tabs (Tizanidine hcl) .... Once daily 3)  Robaxin 500 Mg Tabs (Methocarbamol) .... 2 tablets once daily 4)  Prilosec Otc 20 Mg Tbec (Omeprazole magnesium) .... Once daily  Hypertension Assessment/Plan:      [name] hypertensive risk group is category B: At least one risk factor (excluding diabetes) with no target organ damage.  Today's blood pressure is 120/80.  His blood pressure goal is < 140/90.  Patient Instructions: 1)  Please schedule a follow-up appointment in 3 months  HTN, high cholesterol.  2)  Lipid panel , AST, ALT prior to visit ICD-9 : 272.0  Current Allergies (reviewed today): No known allergies   Flu Vaccine Result Date:  01/20/2009 Flu Vaccine Result:  given Flu Vaccine Next Due:  1 yr Pneumovax Result Date:  04/22/2008 Pneumovax Result:  given Herpes Zoster Result Date:  04/23/2003 Herpes Zoster Result:  given Flex Sig Next Due:  Not Indicated Colonoscopy Result Date:  08/20/2008 Colonoscopy  Result:  normal, tics Colonoscopy Next Due:  10 yr

## 2010-05-24 ENCOUNTER — Other Ambulatory Visit: Payer: Self-pay | Admitting: Cardiology

## 2010-05-24 ENCOUNTER — Ambulatory Visit: Admit: 2010-05-24 | Payer: Self-pay | Admitting: Cardiology

## 2010-05-24 ENCOUNTER — Ambulatory Visit (INDEPENDENT_AMBULATORY_CARE_PROVIDER_SITE_OTHER): Payer: Medicare Other | Admitting: Cardiology

## 2010-05-24 ENCOUNTER — Encounter: Payer: Self-pay | Admitting: Cardiology

## 2010-05-24 DIAGNOSIS — R0989 Other specified symptoms and signs involving the circulatory and respiratory systems: Secondary | ICD-10-CM

## 2010-05-24 DIAGNOSIS — R0602 Shortness of breath: Secondary | ICD-10-CM | POA: Insufficient documentation

## 2010-05-24 DIAGNOSIS — R0609 Other forms of dyspnea: Secondary | ICD-10-CM

## 2010-05-25 LAB — BRAIN NATRIURETIC PEPTIDE: Pro B Natriuretic peptide (BNP): 55.6 pg/mL (ref 0.0–100.0)

## 2010-05-30 NOTE — Assessment & Plan Note (Signed)
Summary: SYNCOPE/MT/[phone] [facility]/AMD UNABLE TO CONFIRM APP...   Vital Signs:  Patient profile:   65 year old male Height:      72 inches Weight:      246 pounds BMI:     33.48 Pulse rate:   65 / minute Pulse (ortho):   78 / minute Pulse rhythm:   regular BP sitting:   124 / 90  (right arm) BP standing:   127 / 89 Cuff size:   large  Vitals Entered By: Judithe Modest CMA (May 24, 2010 3:51 PM)  Serial Vital Signs/Assessments:  Time      Position  BP       Pulse  Resp  Temp     By 4:29 PM   Lying RA  123/83   63                    Amanda Trulove CMA 4:29 PM   Sitting   124/89   69                    Judithe Modest CMA 4:29 PM   Standing  127/89   78                    Amanda Trulove CMA  Comments: 4:29 PM 2 minutes- BP 133/90 HR 74 By: Judithe Modest CMA    Primary Provider:  [name] MD   History of Present Illness: 65 yo with history of primary lateral sclerosis with muscle weakness, syncope/presyncope, and HTN presents for cardiology evaluation.  Patient had an episode this fall where he got out of bed at night to use the bathroom.  After urinating, he was walking out of the bathroom and became lightheaded and passed out momentarily, falling to the ground.  No tachypalpitations.  Besides this episode, he has some occasional lightheadedness with prolonged standing but has not passed out any other times. He has had a chronic dry cough that has been quite bothersome for over a year now.  When he has paroxysms of coughing, he will feel lightheaded like he is going to pass out.  He is on omeprazole for GERD.  This has not helped the cough.   Patient reports chronic shortness of breath and muscle weakness after walking 1 block or climbing steps.  No orthopnea/PND.  No chest pain.    ECG: NSR, incomplete RBBB, right axis deviation, possible RV hypertrophy.   Current Medications (verified): 1)  Lisinopril-Hydrochlorothiazide 20-12.5 Mg Tabs  (Lisinopril-Hydrochlorothiazide) .[name].. 1 Tab By Mouth Daily 2)  Tizanidine Hcl 4 Mg Tabs (Tizanidine Hcl) .... Once Daily 3)  Robaxin-750 750 Mg Tabs (Methocarbamol) .... One Tablet Once Daily 4)  Prilosec Otc 20 Mg Tbec (Omeprazole Magnesium) .... Once Daily 5)  Pravastatin Sodium 20 Mg Tabs (Pravastatin Sodium) .[name].. 1 Tab By Mouth Daily 6)  Fish Oil  Oil (Fish Oil) .... Take 1200 Mg's Two Times A Day 7)  Indomethacin 25 Mg Caps (Indomethacin) .... Take One By Mouth Two Times A Day 8)  Cranberry 300 Mg Tabs (Cranberry) .[name].. 1 By Mouth Once Daily 9)  Vitamin D3 3000 Unit Tabs (Cholecalciferol) .[name].. 1 By Mouth Once Daily 10)  Calcium-Vitamin D 600-200 Mg-Unit Tabs (Calcium-Vitamin D) .[name].. 1 By Mouth Once Daily 11)  Co-Enzyme Q-10 100 Mg Caps (Coenzyme Q10) .... Up To 400 Mg Daily 12)  B Complex-B12  Tabs (B Complex Vitamins) .... Once Daily  Allergies (verified): No Known Drug Allergies  Past History:  Past Medical History: 1. HYPERCHOLESTEROLEMIA 2. HTN: suspected ACEI cough.  3. BRADYCARDIA: Mild sinus bradycardia 4. Syncope/presyncope                                                                                                                                                5. PLS: [facility] [place].  Motor weakness.  6. GERD 7. Fatty liver.  8. PFTs (11/11) were normal.   Family History: Family History of Arthritis Family History Diabetes 2nd degree relative Family History High cholesterol Family History Hypertension Family History of Prostate CA 1st degree relative <50 Family History of Cardiovascular disorder Dad: prostate cancer, HTN, malignant, renal failure, TIA Mom: arrythmia.Marland Kitchenatrial fibrillation? Got PCM.  2 sisters with pacemakers  Social History: Retired, former International aid/development worker Married, lives in [place] Never Smoked Alcohol use-one beverage every 2 weeks. Drug use-no Regular exercise-yes..walking to mail box Diet: fruits and veggies,  water Agent Orange exposure in [place]  Review of Systems       All systems reviewed and negative except as per HPI.   Physical Exam  General:  Well developed, well nourished, in no acute distress. Head:  normocephalic and atraumatic Nose:  no deformity, discharge, inflammation, or lesions Mouth:  Teeth, gums and palate normal. Oral mucosa normal. Neck:  Neck supple, no JVD. No masses, thyromegaly or abnormal cervical nodes. Lungs:  Clear bilaterally to auscultation and percussion. Heart:  Non-displaced PMI, chest non-tender; regular rate and rhythm, S1, S2 without murmurs, rubs or gallops. Carotid upstroke normal, no bruit.  Pedals normal pulses. 1+ edema 3/4 up lower legs bilaterally.  Abdomen:  Bowel sounds positive; abdomen soft and non-tender without masses, organomegaly, or hernias noted. No hepatosplenomegaly. Extremities:  No clubbing or cyanosis. Neurologic:  Alert and oriented x 3. Psych:  depressed affect.     Impression & Recommendations:  Problem # 1:  SYNCOPE (ICD-780.2) Patient had one episode of syncope in association with urination.  He has had other episodes of mild lightheadedness when he stands for a period of time.  He gets lightheaded when he coughs.  I suspect that the symptoms are neurocardiogenic (vasovagal).  I will get an echo to assess for any structural cardiac abnormalities.  He should avoid prolonged standing.    Problem # 2:  DYSPNEA (ICD-786.05) I wonder if this symptom may be more due to muscle weakness from PLS than anything else.  He does have an abnormal ECG with a suggestion of RV strain.  I will get an echo as above to assess LV and RV function.  I will also check BNP today.   Problem # 3:  COUGH (ICD-786.2) Patient has a chronic cough that could be due to ACEI.  Omeprazole has not affected cough.  Stop lisinopril/HCT and instead start losartan 50 mg daily with HCTZ 12.5 mg  daily.   Other Orders: Echocardiogram (Echo) TLB-BNP (B-Natriuretic  Peptide) (83880-BNPR)  Patient Instructions: 1)  Your physician has recommended you make the following change in your medication:  2)  Stop Lisinopril/HCT. 3)  Start Losartan 50mg  daily. 4)  Start HCTZ 12.5mg  daily--this will be one-half of a 25mg  tablet. 5)  Lab today--BNP 786.09 6)  Your physician has requested that you have an echocardiogram.  Echocardiography is a painless test that uses sound waves to create images of your heart. It provides your doctor with information about the size and shape of your heart and how well your heart's chambers and valves are working.  This procedure takes approximately one hour. There are no restrictions for this procedure. 7)  Your physician recommends that you schedule a follow-up appointment in: 1 month with Dr Shirlee Latch. 8)  Lab in 1 month when you see Dr Christie Beckers  786.09 Prescriptions: HYDROCHLOROTHIAZIDE 25 MG TABS (HYDROCHLOROTHIAZIDE) one -half tablet daily  #15 x 6   Entered by:   Katina Dung, RN, BSN   Authorized by:   [name], MD   Signed by:   Katina Dung, RN, BSN on 05/24/2010   Method used:   Electronically to        [facility] (retail)       [address]       [place], [place]  [address]       Ph: [phone]       Fax: [phone]   RxID:   [id number] LOSARTAN POTASSIUM 50 MG TABS (LOSARTAN POTASSIUM) one daily  #30 x 6   Entered by:   Katina Dung, RN, BSN   Authorized by:   [name], MD   Signed by:   Katina Dung, RN, BSN on 05/24/2010   Method used:   Electronically to        [facility] (retail)       [address]       [place], [place]  [address]       Ph: [phone]       Fax: [phone]   RxID:   [id number]    Orders Added: 1)  Echocardiogram [Echo] 2)  TLB-BNP (B-Natriuretic Peptide) [83880-BNPR]

## 2010-06-12 ENCOUNTER — Ambulatory Visit (HOSPITAL_COMMUNITY): Payer: Medicare Other | Attending: Cardiology

## 2010-06-12 DIAGNOSIS — R0609 Other forms of dyspnea: Secondary | ICD-10-CM | POA: Insufficient documentation

## 2010-06-12 DIAGNOSIS — I059 Rheumatic mitral valve disease, unspecified: Secondary | ICD-10-CM | POA: Insufficient documentation

## 2010-06-12 DIAGNOSIS — R55 Syncope and collapse: Secondary | ICD-10-CM | POA: Insufficient documentation

## 2010-06-12 DIAGNOSIS — I10 Essential (primary) hypertension: Secondary | ICD-10-CM | POA: Insufficient documentation

## 2010-06-12 DIAGNOSIS — R0989 Other specified symptoms and signs involving the circulatory and respiratory systems: Secondary | ICD-10-CM | POA: Insufficient documentation

## 2010-06-12 DIAGNOSIS — R05 Cough: Secondary | ICD-10-CM | POA: Insufficient documentation

## 2010-06-12 DIAGNOSIS — I079 Rheumatic tricuspid valve disease, unspecified: Secondary | ICD-10-CM | POA: Insufficient documentation

## 2010-06-12 DIAGNOSIS — R059 Cough, unspecified: Secondary | ICD-10-CM | POA: Insufficient documentation

## 2010-06-12 DIAGNOSIS — E785 Hyperlipidemia, unspecified: Secondary | ICD-10-CM | POA: Insufficient documentation

## 2010-06-18 ENCOUNTER — Telehealth: Payer: Self-pay | Admitting: Cardiology

## 2010-06-22 ENCOUNTER — Other Ambulatory Visit: Payer: Self-pay | Admitting: Cardiology

## 2010-06-22 ENCOUNTER — Other Ambulatory Visit (INDEPENDENT_AMBULATORY_CARE_PROVIDER_SITE_OTHER): Payer: Medicare Other

## 2010-06-22 ENCOUNTER — Ambulatory Visit (INDEPENDENT_AMBULATORY_CARE_PROVIDER_SITE_OTHER): Payer: Medicare Other | Admitting: Cardiology

## 2010-06-22 ENCOUNTER — Encounter: Payer: Self-pay | Admitting: Cardiology

## 2010-06-22 DIAGNOSIS — R55 Syncope and collapse: Secondary | ICD-10-CM

## 2010-06-22 DIAGNOSIS — R0989 Other specified symptoms and signs involving the circulatory and respiratory systems: Secondary | ICD-10-CM

## 2010-06-22 DIAGNOSIS — G478 Other sleep disorders: Secondary | ICD-10-CM | POA: Insufficient documentation

## 2010-06-22 LAB — BASIC METABOLIC PANEL
GFR: 71.83 mL/min (ref >60.00)
Glucose, Bld: 92 mg/dL (ref 70–99)
Potassium: 4.1 mEq/L (ref 3.5–5.1)
Sodium: 142 mEq/L (ref 135–145)

## 2010-06-26 ENCOUNTER — Encounter: Payer: Self-pay | Admitting: Family Medicine

## 2010-06-28 NOTE — Assessment & Plan Note (Signed)
Summary: appt confirm pt wife debbie=mj      Allergies Added: ! PCN  Primary Provider:  [name] MD   History of Present Illness: 65 yo with history of primary lateral sclerosis with muscle weakness, syncope/presyncope, and HTN presents for cardiology followup.  Patient had an episode this fall where he got out of bed at night to use the bathroom.  After urinating, he was walking out of the bathroom and became lightheaded and passed out momentarily, falling to the ground.  No tachypalpitations.  Besides this episode, he has some occasional lightheadedness with prolonged standing but has not passed out any other times. When I initially saw him, he had had a chronic dry cough that has been quite bothersome for over a year.  When he has paroxysms of coughing, he will feel lightheaded like he is going to pass out.   I had him stop lisinopril and start on losartan, which has helped his cough a lot.  He has had no further syncope or presyncope.  Echo showed normal LV size and systolic function, EF 55-60%.  There was mild RV dilation with low normal RV systolic function.  Unable to estimate PA systolic pressure from this study.   Patient reports chronic shortness of breath and muscle weakness after walking 1 block or climbing steps.  No orthopnea/PND.  No chest pain.    Patient's wife reports that she will occasionally hear him stop breathing at night.  He has fatigue during the day but no definite daytime sleepiness.   ECG: NSR, incomplete RBBB, right axis deviation, possible RV hypertrophy.   Current Medications (verified): 1)  Losartan Potassium 50 Mg Tabs (Losartan Potassium) .... One Daily 2)  Tizanidine Hcl 4 Mg Tabs (Tizanidine Hcl) .... Once Daily 3)  Robaxin-750 750 Mg Tabs (Methocarbamol) .... One Tablet Once Daily 4)  Prilosec Otc 20 Mg Tbec (Omeprazole Magnesium) .... Once Daily 5)  Pravastatin Sodium 20 Mg Tabs (Pravastatin Sodium) .[name].. 1 Tab By Mouth Daily 6)  Fish Oil  Oil (Fish  Oil) .... Take 1200 Mg's Two Times A Day 7)  Indomethacin 25 Mg Caps (Indomethacin) .... Take One By Mouth Two Times A Day 8)  Cranberry 300 Mg Tabs (Cranberry) .[name].. 1 By Mouth Once Daily 9)  Vitamin D3 3000 Unit Tabs (Cholecalciferol) .[name].. 1 By Mouth Once Daily 10)  Calcium-Vitamin D 600-200 Mg-Unit Tabs (Calcium-Vitamin D) .[name].. 1 By Mouth Once Daily 11)  Co-Enzyme Q-10 100 Mg Caps (Coenzyme Q10) .... Up To 400 Mg Daily 12)  B Complex-B12  Tabs (B Complex Vitamins) .... Once Daily 13)  Hydrochlorothiazide 25 Mg Tabs (Hydrochlorothiazide) .... One -Half Tablet Daily  Allergies (verified): 1)  ! Pcn  Past History:  Past Medical History: 1. HYPERCHOLESTEROLEMIA 2. HTN: suspected ACEI cough.  3. BRADYCARDIA: Mild sinus bradycardia 4. Syncope/presyncope: Suspect neurocardiogenic.  Situation syncope/presyncope with micturation and cough. 5. PLS: Goes once a year to [facility] in [place].  Motor weakness.  6. GERD 7. Fatty liver.  8. PFTs (11/11) were normal.  9. Echo (2/12): EF 55-60%, no regional wall motion abnormalities, mild RV dilation with low normal systolic function.  No TR doppler jet so unable to estimate PA systolic pressure.   Family History: Reviewed history from 05/24/2010 and no changes required. Family History of Arthritis Family History Diabetes 2nd degree relative Family History High cholesterol Family History Hypertension Family History of Prostate CA 1st degree relative <50 Family History of Cardiovascular disorder Dad: prostate cancer, HTN, malignant, renal failure,  TIA Mom: arrythmia.Marland Kitchenatrial fibrillation? Got PCM.  2 sisters with pacemakers  Social History: Reviewed history from 05/24/2010 and no changes required. Retired, former International aid/development worker Married, lives in [place] Never Smoked Alcohol use-one beverage every 2 weeks. Drug use-no Regular exercise-yes..walking to mail box Diet: fruits and veggies, water Agent Orange exposure in [place]  Review of  Systems       All systems reviewed and negative except as per HPI.   Vital Signs:  Patient profile:   65 year old male Height:      72 inches Weight:      252 pounds BMI:     34.30 Pulse rate:   68 / minute BP sitting:   120 / 88  (left arm)  Vitals Entered By: Laurance Flatten CMA (June 22, 2010 9:08 AM)  Physical Exam  General:  Well developed, well nourished, in no acute distress. Neck:  Neck supple, no JVD. No masses, thyromegaly or abnormal cervical nodes. Lungs:  Clear bilaterally to auscultation and percussion. Heart:  Non-displaced PMI, chest non-tender; regular rate and rhythm, S1, S2 without murmurs, rubs or gallops. Carotid upstroke normal, no bruit.  Pedals normal pulses. 1+ edema 1/2 up lower legs bilaterally.  Abdomen:  Bowel sounds positive; abdomen soft and non-tender without masses, organomegaly, or hernias noted. No hepatosplenomegaly. Extremities:  No clubbing or cyanosis. Neurologic:  Alert and oriented x 3. Psych:  Normal affect.   Impression & Recommendations:  Problem # 1:  SYNCOPE (ICD-780.2) Patient had one episode of syncope in association with urination.  He has had other episodes of mild lightheadedness when he stands for a period of time.  He gets lightheaded when he coughs.  I suspect that the symptoms are neurocardiogenic (vasovagal).  He should avoid prolonged standing and take measures to suppress cough when it occurs.   Problem # 2:  RV ENLARGEMENT Mild RV enlargement with low normal RV systolic function on echo but unable to estimate PA systolic pressure from this echo.  Some signs of RV strain on ECG.  BNP not significantly elevated.  He has some stable mild dyspnea.  Patient's wife reports that he will stop breathing at times at night.  I wonder if he could have nocturnal hypoxemia related to either OSA or PLS itself with respiratory muscle weakness.  This could cause a degree of pulmonary HTN and right heart strain.  I am going to get a sleep study  to investigate.   Problem # 3:  HYPERTENSION, MILD (ICD-401.1) BP is normal with change to losartan.   Other Orders: Misc. Referral (Misc. Ref)  Patient Instructions: 1)  Your physician has recommended that you have a sleep study.  This test records several body functions during sleep, including:  brain activity, eye movement, oxygen and carbon dioxide blood levels, heart rate and rhythm, breathing rate and rhythm, the flow of air through your mouth and nose, snoring, body muscle movements, and chest and belly movement. 2)  Your physician wants you to follow-up in: 1 year with Dr Shirlee Latch.[facility] 2013)  You will receive a reminder letter in the mail two months in advance. If you don't receive a letter, please call our office to schedule the follow-up appointment.

## 2010-06-28 NOTE — Progress Notes (Signed)
Summary: returning call   Phone Note Call from Patient Call back at [facility] Phone [phone]   Caller: Patient Summary of Call: Pt returning call Initial call taken by: Judie Grieve,  June 18, 2010 2:47 PM  Follow-up for Phone Call        I talked with wife

## 2010-07-25 ENCOUNTER — Ambulatory Visit (HOSPITAL_BASED_OUTPATIENT_CLINIC_OR_DEPARTMENT_OTHER): Payer: Medicare Other | Attending: Cardiology

## 2010-07-25 ENCOUNTER — Encounter: Payer: Self-pay | Admitting: Cardiology

## 2010-07-25 DIAGNOSIS — I491 Atrial premature depolarization: Secondary | ICD-10-CM | POA: Insufficient documentation

## 2010-07-25 DIAGNOSIS — I4949 Other premature depolarization: Secondary | ICD-10-CM | POA: Insufficient documentation

## 2010-07-25 DIAGNOSIS — R0609 Other forms of dyspnea: Secondary | ICD-10-CM | POA: Insufficient documentation

## 2010-07-25 DIAGNOSIS — R0989 Other specified symptoms and signs involving the circulatory and respiratory systems: Secondary | ICD-10-CM | POA: Insufficient documentation

## 2010-07-25 DIAGNOSIS — G4733 Obstructive sleep apnea (adult) (pediatric): Secondary | ICD-10-CM | POA: Insufficient documentation

## 2010-08-06 ENCOUNTER — Other Ambulatory Visit: Payer: Self-pay | Admitting: *Deleted

## 2010-08-06 DIAGNOSIS — E7800 Pure hypercholesterolemia, unspecified: Secondary | ICD-10-CM

## 2010-08-07 ENCOUNTER — Other Ambulatory Visit: Payer: Self-pay | Admitting: Family Medicine

## 2010-08-07 DIAGNOSIS — G4733 Obstructive sleep apnea (adult) (pediatric): Secondary | ICD-10-CM

## 2010-08-07 DIAGNOSIS — I4949 Other premature depolarization: Secondary | ICD-10-CM

## 2010-08-07 DIAGNOSIS — R0609 Other forms of dyspnea: Secondary | ICD-10-CM

## 2010-08-07 DIAGNOSIS — I491 Atrial premature depolarization: Secondary | ICD-10-CM

## 2010-08-07 DIAGNOSIS — R0989 Other specified symptoms and signs involving the circulatory and respiratory systems: Secondary | ICD-10-CM

## 2010-08-08 NOTE — Procedures (Signed)
NAMEPACE, LAMADRID NO.:  [id number]  MEDICAL RECORD NO.:  [id number]          PATIENT TYPE:  OUT  LOCATION:  SLEEP CENTER                 FACILITY:  [facility]  PHYSICIAN:  Barbaraann Share, MD,FCCPDATE OF BIRTH:  June 09, 1945  DATE OF STUDY:  07/25/2010                           NOCTURNAL POLYSOMNOGRAM  REFERRING PHYSICIAN:  DALTON MCLEAN  INDICATION FOR STUDY:  Hypersomnia with sleep apnea.  EPWORTH SLEEPINESS SCORE:  6.  MEDICATIONS:  SLEEP ARCHITECTURE:  The patient had a total sleep time of 289 minutes with no slow wave sleep and only 44 minutes of REM.  Sleep onset latency was prolonged at 51 minutes, and REM onset was normal at 68 minutes. Sleep efficiency was poor at 67% during the diagnostic portion of the study, and 91% during the titration portion.  RESPIRATORY DATA:  The patient underwent a split-night protocol, where he was found to have 50 obstructive events in the first 120 minutes of sleep.  This gave him an apnea-hypopnea index during the diagnostic portion of the study of 25 events per hour.  The events occurred all in the supine position, there was moderate to loud snoring noted throughout.  By protocol, the patient was then fitted with a ResMed Quattro standard nasal CPAP mask and CPAP titration was initiated.  [name] obstructive events were totally eliminated with a CPAP pressure of 5, however, the pressure was titrated to 7-cm because of breakthrough snoring.  The patient seemed to have excellent tolerance on this level.  OXYGEN DATA:  There was O2 desaturation as low as 78% with the patient's obstructive events.  This resolved with optimal CPAP pressure.  CARDIAC DATA:  There was occasional PAC and PVC, but no clinically significant arrhythmias were seen.  MOVEMENT-PARASOMNIA:  The patient had no significant leg jerks or other abnormal behaviors seen.  IMPRESSIONS-RECOMMENDATIONS: 1. Split night study reveals moderate  obstructive sleep apnea with an     AHI during the diagnostic portion of the study of 25 events per     hour and desaturation as low as 78%.  The patient was then fitted     with a ResMed Quattro standard nasal CPAP mask and titrated to an     optimal pressure of 7-cm of water.  The patient should also be     encouraged to work aggressively on weight loss.  Given this degree     of sleep apnea, the patient may     also respond nicely to upper airway surgery or possible dental     appliance.  Clinical correlation is suggested. 2. Occasional PAC and PVC noted, but no clinically significant     arrhythmias were seen.     Barbaraann Share, MD,FCCP Diplomate, American Board of Sleep Medicine Electronically Signed    [facility]  D:  08/07/2010 19:46:27  T:  08/08/2010 06:14:20  Job:  [id number]

## 2010-08-14 ENCOUNTER — Other Ambulatory Visit (INDEPENDENT_AMBULATORY_CARE_PROVIDER_SITE_OTHER): Payer: Medicare Other | Admitting: Family Medicine

## 2010-08-14 DIAGNOSIS — I10 Essential (primary) hypertension: Secondary | ICD-10-CM

## 2010-08-14 DIAGNOSIS — E7800 Pure hypercholesterolemia, unspecified: Secondary | ICD-10-CM

## 2010-08-14 LAB — HEPATIC FUNCTION PANEL
ALT: 39 U/L (ref 0–53)
AST: 33 U/L (ref 0–37)
Alkaline Phosphatase: 46 U/L (ref 39–117)
Bilirubin, Direct: 0.1 mg/dL (ref 0.0–0.3)
Total Bilirubin: 0.6 mg/dL (ref 0.3–1.2)

## 2010-08-14 LAB — LIPID PANEL
LDL Cholesterol: 115 mg/dL — ABNORMAL HIGH (ref 0–99)
Total CHOL/HDL Ratio: 5
VLDL: 37 mg/dL (ref 0.0–40.0)

## 2010-08-14 LAB — BASIC METABOLIC PANEL
BUN: 18 mg/dL (ref 6–23)
Chloride: 110 mEq/L (ref 96–112)
Potassium: 4.6 mEq/L (ref 3.5–5.1)
Sodium: 143 mEq/L (ref 135–145)

## 2010-08-17 ENCOUNTER — Encounter: Payer: Self-pay | Admitting: Family Medicine

## 2010-08-20 ENCOUNTER — Telehealth: Payer: Self-pay | Admitting: *Deleted

## 2010-08-20 DIAGNOSIS — G4730 Sleep apnea, unspecified: Secondary | ICD-10-CM

## 2010-08-20 NOTE — Telephone Encounter (Signed)
Dr Shirlee Latch reviewed sleep study done 07/25/10. Moderate sleep apnea. He recommended referral to pulmonologist  for CPAP evaluation and management. I discussed with pt by telephone and he agreed with the referral to pulmonologist.

## 2010-08-21 ENCOUNTER — Encounter (HOSPITAL_COMMUNITY): Payer: Self-pay | Admitting: Neurology

## 2010-08-21 ENCOUNTER — Ambulatory Visit (INDEPENDENT_AMBULATORY_CARE_PROVIDER_SITE_OTHER): Payer: Medicare Other | Admitting: Family Medicine

## 2010-08-21 ENCOUNTER — Encounter: Payer: Self-pay | Admitting: Family Medicine

## 2010-08-21 DIAGNOSIS — G4730 Sleep apnea, unspecified: Secondary | ICD-10-CM | POA: Insufficient documentation

## 2010-08-21 DIAGNOSIS — G1221 Amyotrophic lateral sclerosis: Secondary | ICD-10-CM

## 2010-08-21 DIAGNOSIS — I10 Essential (primary) hypertension: Secondary | ICD-10-CM

## 2010-08-21 DIAGNOSIS — Z125 Encounter for screening for malignant neoplasm of prostate: Secondary | ICD-10-CM

## 2010-08-21 DIAGNOSIS — Z0000 Encounter for general adult medical examination without abnormal findings: Secondary | ICD-10-CM

## 2010-08-21 DIAGNOSIS — E7800 Pure hypercholesterolemia, unspecified: Secondary | ICD-10-CM

## 2010-08-21 NOTE — Assessment & Plan Note (Signed)
LDL at goal...increase fish oil to try to decrease trig. Encouraged exercise, weight loss, healthy eating habits.

## 2010-08-21 NOTE — Patient Instructions (Addendum)
Increase fish oil to 4000 mg divided daily Decrease carbohydrates, avoid fried foods. Discuss HCPOA and living will with family.

## 2010-08-21 NOTE — Progress Notes (Signed)
Subjective:    Patient ID: Edward Frank, male    DOB: [birth year], 65 y.o.   MRN: [id number]  HPI 65 yo with history of primary lateral sclerosis with muscle weakness, syncope/presyncope, and HTN  presents for Annual medicare wellness  I have personally reviewed the Medicare Annual Wellness questionnaire and have noted 1. [name] medical and social history 2. Their use of alcohol, tobacco or illicit drugs 3. Their current medications and supplements 4. [name] functional ability including ADL's, fall risks, home safety risks and hearing or visual             impairment. 5. Diet and physical activities 6. Evidence for depression or mood disorders The patients weight, height, BMI and visual acuity have been recorded in the chart I have made referrals, counseling and provided education to the patient based review of the above and I have provided the pt with a written personalized care plan for preventive services.  Saw Cardiologist 06/2010.  ACEI changes to losartan given cough...symptom improved. BP remains well controlled.  Given RV enlargement and spousal report of occ apnea at night...cards sent for OSA eval...showed moderate sleep apnea.    Review of Systems  Constitutional: Negative for fever, fatigue and unexpected weight change.  HENT: Negative for ear pain, congestion, sore throat, rhinorrhea, trouble swallowing and postnasal drip.   Eyes: Negative for pain.  Respiratory: Negative for cough, shortness of breath and wheezing.   Cardiovascular: Negative for chest pain, palpitations and leg swelling.  Gastrointestinal: Negative for nausea, abdominal pain, diarrhea, constipation and blood in stool.  Genitourinary: Negative for dysuria, urgency, hematuria, discharge, penile swelling, scrotal swelling, difficulty urinating, penile pain and testicular pain.  Skin: Negative for rash.  Neurological: Negative for syncope, weakness, light-headedness, numbness and  headaches.  Psychiatric/Behavioral: Negative for behavioral problems and dysphoric mood. The patient is not nervous/anxious.        Objective:   Physical Exam  Constitutional: He appears well-developed and well-nourished.  Non-toxic appearance. He does not appear ill. No distress.  HENT:  Head: Normocephalic and atraumatic.  Right Ear: Hearing, tympanic membrane, external ear and ear canal normal.  Left Ear: Hearing, tympanic membrane, external ear and ear canal normal.  Nose: Nose normal.  Mouth/Throat: Uvula is midline, oropharynx is clear and moist and mucous membranes are normal.  Eyes: Conjunctivae, EOM and lids are normal. Pupils are equal, round, and reactive to light. No foreign bodies found.  Neck: Trachea normal, normal range of motion and phonation normal. Neck supple. Carotid bruit is not present. No mass and no thyromegaly present.  Cardiovascular: Normal rate, regular rhythm, S1 normal, S2 normal, intact distal pulses and normal pulses.  Exam reveals no gallop.   No murmur heard. Pulmonary/Chest: Breath sounds normal. He has no wheezes. He has no rhonchi. He has no rales.  Abdominal: Soft. Normal appearance and bowel sounds are normal. There is no hepatosplenomegaly. There is no tenderness. There is no rebound, no guarding and no CVA tenderness. No hernia. Hernia confirmed negative in the right inguinal area and confirmed negative in the left inguinal area.  Genitourinary: Rectum normal, prostate normal, testes normal and penis normal. Rectal exam shows no external hemorrhoid, no internal hemorrhoid, no fissure, no mass, no tenderness and anal tone normal. Guaiac negative stool. Prostate is not enlarged and not tender. Right testis shows no mass and no tenderness. Left testis shows no mass and no tenderness. No paraphimosis or penile tenderness.  Lymphadenopathy:    He has no cervical  adenopathy.       Right: No inguinal adenopathy present.       Left: No inguinal adenopathy  present.  Neurological: He is alert. He displays atrophy. No cranial nerve deficit or sensory deficit. He exhibits abnormal muscle tone. Gait abnormal.  Skin: Skin is warm, dry and intact. No rash noted.  Psychiatric: He has a normal mood and affect. His speech is normal and behavior is normal. Judgment normal.          Assessment & Plan:  Annual Medicare Wellness: [name] preventative maintenance and recommended screening tests for an annual wellness exam were reviewed in full today. Brought up to date unless services declined.  Counselled on the importance of diet, exercise, and its role in overall health and mortality. [name] FH and SH was reviewed, including their home life, tobacco status, and drug and alcohol status.

## 2010-08-21 NOTE — Assessment & Plan Note (Signed)
Well controlled. Continue current medication.  

## 2010-08-21 NOTE — Assessment & Plan Note (Signed)
Pending referral to Pulm.

## 2010-08-22 ENCOUNTER — Telehealth: Payer: Self-pay | Admitting: Family Medicine

## 2010-08-22 DIAGNOSIS — Z125 Encounter for screening for malignant neoplasm of prostate: Secondary | ICD-10-CM

## 2010-08-22 DIAGNOSIS — R972 Elevated prostate specific antigen [PSA]: Secondary | ICD-10-CM

## 2010-08-22 LAB — PSA, MEDICARE: PSA: 2.32 ng/mL (ref <=4.00)

## 2010-08-22 NOTE — Telephone Encounter (Signed)
Notify pt that PSA is in nml range, but up 1 point from 2010 (1.67).Recheck in 6months with free and total PSA.  Terri--I do not see a medicare PSa FREE and total...how do I order that?

## 2010-08-22 NOTE — Telephone Encounter (Signed)
Okay . Free and total PSA is entered.  Heather please notify pt and make appt.

## 2010-08-22 NOTE — Telephone Encounter (Signed)
If you are ordering a total and free psa it cannot have a screening dx. But it has to be a dx that Medicare will accept. Thanks, Camelia Eng

## 2010-08-22 NOTE — Telephone Encounter (Signed)
Patient advised.

## 2010-08-28 ENCOUNTER — Ambulatory Visit: Payer: Medicare Other | Admitting: Family Medicine

## 2010-09-04 NOTE — H&P (Signed)
NAMEJAYMERE, ALEN             ACCOUNT NO.:  [id number]   MEDICAL RECORD NO.:  [id number]          PATIENT TYPE:  AMB   LOCATION:  DAY                           FACILITY:  [facility]   PHYSICIAN:  Dalia Heading, M.D.  DATE OF BIRTH:  [birth year]   DATE OF ADMISSION:  DATE OF DISCHARGE:  LH                              HISTORY & PHYSICAL   CHIEF COMPLAINT:  Need for screening colonoscopy.   HISTORY OF PRESENT ILLNESS:  The patient is a 65 year old white male who  is referred for endoscopic evaluation.  He needs a colonoscopy for  screening purposes.  No abdominal pain, weight loss, nausea, vomiting,  diarrhea, constipation, melena, or hematochezia have been noted.  He has  never had a colonoscopy.  There is no family history of colon carcinoma.   PAST MEDICAL HISTORY:  Hypertension.   PAST SURGICAL HISTORY:  Herniorrhaphy and shoulder repair.   MEDICATIONS:  Tizanidine, Robaxin, lisinopril.   ALLERGIES:  No known drug allergies.   REVIEW OF SYSTEMS:  Noncontributory.   PHYSICAL EXAMINATION:  GENERAL:  The patient is a well-developed, well-  nourished white male in no acute distress.  LUNGS:  Clear to auscultation with equal breath sounds bilaterally.  HEART:  Regular rate and rhythm without S3, S4, or murmurs.  ABDOMEN:  Soft, nontender, and nondistended.  No hepatosplenomegaly or  masses are noted.  RECTAL:  Deferred to the procedure.   IMPRESSION:  Need for screening colonoscopy.   PLAN:  The patient is scheduled for colonoscopy on Aug 30, 2008.  The  risks and benefits of the procedure including bleeding and perforation  were fully explained to the patient, gave informed consent.      Dalia Heading, M.D.  Electronically Signed     MAJ/MEDQ  D:  08/18/2008  T:  08/19/2008  Job:  [id number]   cc:   Corrie Mckusick, M.D.  Fax: [phone]

## 2010-10-04 ENCOUNTER — Telehealth: Payer: Self-pay | Admitting: Cardiology

## 2010-10-04 NOTE — Telephone Encounter (Signed)
Spoke with Mr. Liou wife to schedule the appt with Pulmonary.   She is out of town now and will call me to schedule at a later date.

## 2010-10-04 NOTE — Telephone Encounter (Signed)
Pt had sleep study done in April and was told he would be called re referral to a pulmonologist and they havent heard anything-pls call with info

## 2010-10-04 NOTE — Telephone Encounter (Signed)
Patient's wife called Will have our office do the referral now to pulmonary

## 2011-01-04 ENCOUNTER — Telehealth: Payer: Self-pay | Admitting: *Deleted

## 2011-01-04 DIAGNOSIS — G4730 Sleep apnea, unspecified: Secondary | ICD-10-CM

## 2011-01-04 NOTE — Telephone Encounter (Signed)
Pt's wife states pt never got an appt with pulmonary for  evaluation and management of sleep apnea. Pt had sleep study 07/25/10. Dr Shirlee Latch will order referral to pulmonary for evaluation and management of sleep apnea.

## 2011-01-22 ENCOUNTER — Encounter: Payer: Self-pay | Admitting: Pulmonary Disease

## 2011-01-22 ENCOUNTER — Ambulatory Visit (INDEPENDENT_AMBULATORY_CARE_PROVIDER_SITE_OTHER): Payer: Medicare Other | Admitting: Pulmonary Disease

## 2011-01-22 VITALS — BP 128/84 | HR 94 | Temp 98.0°F | Ht 71.5 in | Wt 232.8 lb

## 2011-01-22 DIAGNOSIS — G4733 Obstructive sleep apnea (adult) (pediatric): Secondary | ICD-10-CM

## 2011-01-22 HISTORY — DX: Obstructive sleep apnea (adult) (pediatric): G47.33

## 2011-01-22 NOTE — Progress Notes (Signed)
Subjective:    Patient ID: Edward Frank, male    DOB: 1945/07/05, 65 y.o.   MRN: [id number]  HPI CC: Edward Frank, [facility], Dr. Isabelle Course neurology  65 yo male for sleep evaluation.  He had sleep test July 25, 2010>>AHI 25, SpO2 78%.  He did well with CPAP.    He was referred for sleep test due to snoring and witnessed apnea by his wife.  He goes to bed at 11 pm.  He wakes up after 3 hours to use bathroom.  He gets out of bed at 11 am.  He denies headaches.  He is not using anything to help him sleep or stay awake.  The patient denies sleep walking, sleep talking, bruxism, or nightmares.  There is no history of restless legs.  The patient denies sleep hallucinations, sleep paralysis, or cataplexy.  He has occasional alcohol use.  There is no history of smoking.  He has lost about 8 lbs since sleep test.  He is doing Atkins diet through the [place].  Epworth score is 5 out of 24.  Past Medical History  Diagnosis Date  . Hyperlipidemia   . Hypertension   . Bradycardia   . GERD (gastroesophageal reflux disease)   . Fatty liver   . Syncope   . OSA (obstructive sleep apnea) 01/22/2011  . Primary lateral sclerosis   . Right bundle branch block      Family History  Problem Relation Age of Onset  . Heart disease Mother     ? afib and got pacemaker  . Cancer Father     prostate  . Hypertension Father   . Kidney disease Father     failure  . Stroke Father   . Uterine cancer Mother     Social History  . Marital Status: Married   Occupational History  . retired      Administrator, [employer]   Social History Main Topics  . Smoking status: Never Smoker   . Alcohol Use: Yes  . Drug Use: No   Social History Narrative   Minimal exercsie: due to PLS. Healthy diet.Full Code.No living will, no HCPOA    No Known Allergies   Outpatient Prescriptions Prior to Visit  Medication Sig Dispense Refill  . b complex vitamins capsule Take 1 capsule by mouth daily.        . Calcium  Carbonate-Vitamin D (CALCIUM + D) 600-200 MG-UNIT TABS Take 1 capsule by mouth daily.        . Cholecalciferol (VITAMIN D3) 1000 UNITS CAPS Take 1 capsule by mouth daily.        [name] Co-Enzyme Q-10 100 MG CAPS Take 4 capsules by mouth daily.        . Cranberry 250 MG TABS Take 1 tablet by mouth daily.        . indomethacin (INDOCIN) 25 MG capsule Take 25 mg by mouth 2 (two) times daily with a meal.        . losartan (COZAAR) 50 MG tablet Take 50 mg by mouth daily.        . methocarbamol (ROBAXIN) 750 MG tablet Take 1,500 mg by mouth daily.       [name] omeprazole (PRILOSEC) 20 MG capsule Take 20 mg by mouth daily.        . pravastatin (PRAVACHOL) 20 MG tablet TAKE ONE TABLET BY MOUTH EVERY DAY  90 tablet  11  . RESVERATROL 100 MG CAPS Take 1 capsule by mouth daily.        [name]  Omega-3 Fatty Acids (FISH OIL) 1200 MG CAPS Take 2 capsules by mouth 2 (two) times daily.       . hydrochlorothiazide 25 MG tablet Take 12.5 mg by mouth daily.        [name] tiZANidine (ZANAFLEX) 4 MG capsule Take 4 mg by mouth daily.         Review of Systems  Joint Stiffness     Objective:   Physical Exam  BP 128/84  Pulse 94  Temp(Src) 98 F (36.7 C) (Oral)  Ht 5' 11.5" (1.816 m)  Wt 232 lb 12.8 oz (105.597 kg)  BMI 32.02 kg/m2  SpO2 94%  General - Obese HEENT - PERRLA, EOMI, wears glasses, MP 3, no oral exudate, no LAN, no thyromegaly Cardiac - s1s2 regular, no murmur Chest - CTA Abd - soft, nontender Ext - no e/c/c Neuro - CN intact Psych - normal mood/behavior     Assessment & Plan:   OSA (obstructive sleep apnea) He has moderate sleep apnea.  I have reviewed his sleep test results with the patient.  Explained how sleep apnea can affect the patient's health.  Driving precautions and importance of weight loss were discussed.  Treatment options for sleep apnea were reviewed.  Will proceed with CPAP 7 cm H2O.   Updated Medication List Outpatient Encounter Prescriptions as of 01/22/2011  Medication Sig  Dispense Refill  . b complex vitamins capsule Take 1 capsule by mouth daily.        . Calcium Carbonate-Vitamin D (CALCIUM + D) 600-200 MG-UNIT TABS Take 1 capsule by mouth daily.        . Cholecalciferol (VITAMIN D3) 1000 UNITS CAPS Take 1 capsule by mouth daily.        [name] Co-Enzyme Q-10 100 MG CAPS Take 4 capsules by mouth daily.        . Cranberry 250 MG TABS Take 1 tablet by mouth daily.        . indomethacin (INDOCIN) 25 MG capsule Take 25 mg by mouth 2 (two) times daily with a meal.        . losartan (COZAAR) 50 MG tablet Take 50 mg by mouth daily.        . methocarbamol (ROBAXIN) 750 MG tablet Take 1,500 mg by mouth daily.       . Omega-3 Fatty Acids (FISH OIL TRIPLE STRENGTH) 1400 MG CAPS Take 2 capsules by mouth 2 (two) times daily.        [name] omeprazole (PRILOSEC) 20 MG capsule Take 20 mg by mouth daily.        . pravastatin (PRAVACHOL) 20 MG tablet TAKE ONE TABLET BY MOUTH EVERY DAY  90 tablet  11  . RESVERATROL 100 MG CAPS Take 1 capsule by mouth daily.        [name] DISCONTD: Omega-3 Fatty Acids (FISH OIL) 1200 MG CAPS Take 2 capsules by mouth 2 (two) times daily.       [name] DISCONTD: hydrochlorothiazide 25 MG tablet Take 12.5 mg by mouth daily.        [name] DISCONTD: tiZANidine (ZANAFLEX) 4 MG capsule Take 4 mg by mouth daily.

## 2011-01-22 NOTE — Assessment & Plan Note (Signed)
He has moderate sleep apnea.  I have reviewed his sleep test results with the patient.  Explained how sleep apnea can affect the patient's health.  Driving precautions and importance of weight loss were discussed.  Treatment options for sleep apnea were reviewed.  Will proceed with CPAP 7 cm H2O.

## 2011-01-22 NOTE — Patient Instructions (Signed)
Will arrange for CPAP machine Follow up in 2 months 

## 2011-02-13 LAB — PULMONARY FUNCTION TEST

## 2011-02-21 ENCOUNTER — Other Ambulatory Visit: Payer: Medicare Other

## 2011-02-25 ENCOUNTER — Other Ambulatory Visit (INDEPENDENT_AMBULATORY_CARE_PROVIDER_SITE_OTHER): Payer: Medicare Other

## 2011-02-25 DIAGNOSIS — R972 Elevated prostate specific antigen [PSA]: Secondary | ICD-10-CM

## 2011-02-25 DIAGNOSIS — E7800 Pure hypercholesterolemia, unspecified: Secondary | ICD-10-CM

## 2011-02-25 LAB — COMPREHENSIVE METABOLIC PANEL
AST: 34 U/L (ref 0–37)
Albumin: 4.4 g/dL (ref 3.5–5.2)
Alkaline Phosphatase: 45 U/L (ref 39–117)
BUN: 15 mg/dL (ref 6–23)
Creatinine, Ser: 1.2 mg/dL (ref 0.4–1.5)
Glucose, Bld: 108 mg/dL — ABNORMAL HIGH (ref 70–99)
Total Bilirubin: 0.8 mg/dL (ref 0.3–1.2)

## 2011-02-25 LAB — LIPID PANEL
HDL: 48.3 mg/dL (ref >39.00)
LDL Cholesterol: 65 mg/dL (ref 0–99)
Total CHOL/HDL Ratio: 3
VLDL: 17.2 mg/dL (ref 0.0–40.0)

## 2011-02-25 LAB — PSA, TOTAL AND FREE
PSA, Free: 0.65 ng/mL
PSA: 1.66 ng/mL (ref <=4.00)

## 2011-02-26 ENCOUNTER — Ambulatory Visit: Payer: Medicare Other | Admitting: Family Medicine

## 2011-03-04 ENCOUNTER — Encounter: Payer: Self-pay | Admitting: Family Medicine

## 2011-03-04 ENCOUNTER — Ambulatory Visit (INDEPENDENT_AMBULATORY_CARE_PROVIDER_SITE_OTHER): Payer: Medicare Other | Admitting: Family Medicine

## 2011-03-04 DIAGNOSIS — G1221 Amyotrophic lateral sclerosis: Secondary | ICD-10-CM

## 2011-03-04 DIAGNOSIS — R0602 Shortness of breath: Secondary | ICD-10-CM

## 2011-03-04 DIAGNOSIS — G4733 Obstructive sleep apnea (adult) (pediatric): Secondary | ICD-10-CM

## 2011-03-04 DIAGNOSIS — I10 Essential (primary) hypertension: Secondary | ICD-10-CM

## 2011-03-04 DIAGNOSIS — E7800 Pure hypercholesterolemia, unspecified: Secondary | ICD-10-CM

## 2011-03-04 NOTE — Assessment & Plan Note (Signed)
On CPAP, tolerating moderately well.

## 2011-03-04 NOTE — Patient Instructions (Addendum)
Check BP at home... Call if greater than  or equal to140/90 consistently. Continue working on weight loss, low carb diet. At next OV we can consider decreasing cholesterol med.  Follow up in 6 months for SUBSEQUENT Medicare wellness with fasting labs prior

## 2011-03-04 NOTE — Progress Notes (Signed)
  Subjective:    Patient ID: Edward Frank, male    DOB: 11-05-1945, 65 y.o.   MRN: [id number]  HPI 65 year old male with PLS presents for 6 month follow up.  PLS: Moderate change in follow up few months ago at [facility]. Pt doing fairly well.   Hypertension: Well controlled on losartan. RBBB, bradycardia  Followed by Cards, Dr. Shirlee Latch. Using medication without problems or lightheadedness:  Chest pain with exertion: No Edema:stable Short of breath: stable... [facility] Average home BPs: 130/94 at [place], but normal at home.  Other issues:  Sleep apnea dx with pulm. Set up with [facility], tolerating moderately well.No change in energy.  Elevated Cholesterol: On pravachol 20. Now on fish oil... LDL at goal!, Tri at goal. Using medications without problems:None Muscle aches: None Diet compliance: Great control, low carb... Has lost 20 lbs intentionally at 6  Exercise:Limited by ALS. Other complaints:    Review of Systems  Constitutional: Negative for fever and fatigue.  HENT: Negative for ear pain.   Eyes: Negative for pain.  Respiratory: Negative for cough and wheezing.   Cardiovascular: Positive for leg swelling. Negative for chest pain and palpitations.  Gastrointestinal: Negative for abdominal pain, blood in stool and abdominal distention.       Objective:   Physical Exam  Constitutional: Vital signs are normal. He appears well-developed and well-nourished.  HENT:  Head: Normocephalic.  Right Ear: Hearing normal.  Left Ear: Hearing normal.  Nose: Nose normal.  Mouth/Throat: Oropharynx is clear and moist and mucous membranes are normal.  Neck: Trachea normal. Carotid bruit is not present. No mass and no thyromegaly present.  Cardiovascular: Normal rate, regular rhythm and normal pulses.  Exam reveals no gallop, no distant heart sounds and no friction rub.   No murmur heard.      Mild B peripheral edema  Pulmonary/Chest: Effort normal and breath sounds normal. No respiratory distress.    Skin: Skin is warm, dry and intact. No rash noted.  Psychiatric: He has a normal mood and affect. His speech is normal and behavior is normal. Thought content normal.          Assessment & Plan:

## 2011-03-04 NOTE — Assessment & Plan Note (Signed)
Excellent improvement on pravastatin from 20 lb weight loss. If continues to improve consider trial off medication at next OV.

## 2011-03-04 NOTE — Assessment & Plan Note (Signed)
Well controlled. Continue current medication. Follow at home to make sure consistently  At goal.

## 2011-04-01 ENCOUNTER — Encounter: Payer: Self-pay | Admitting: Pulmonary Disease

## 2011-04-01 ENCOUNTER — Ambulatory Visit (INDEPENDENT_AMBULATORY_CARE_PROVIDER_SITE_OTHER): Payer: Medicare Other | Admitting: Pulmonary Disease

## 2011-04-01 VITALS — BP 112/74 | HR 65 | Temp 97.3°F | Ht 72.0 in | Wt 225.6 lb

## 2011-04-01 DIAGNOSIS — G4733 Obstructive sleep apnea (adult) (pediatric): Secondary | ICD-10-CM

## 2011-04-01 NOTE — Patient Instructions (Signed)
Will call with results of CPAP report Follow up in 6 months 

## 2011-04-01 NOTE — Assessment & Plan Note (Signed)
He report compliance with therapy.  He is having some trouble with mouth dryness, and discussed techniques to help with this.  Will call him with report of CPAP download, and then discuss if any changes to his pressure setting are needed.  Encouraged him to continue with his weight loss.  Explained that if he continues with weight loss, then may be able to consider whether he needs to continue with CPAP therapy.

## 2011-04-01 NOTE — Progress Notes (Signed)
Chief Complaint  Patient presents with  . Follow-up    Pt states everynight x 6-8 hrs a night. Pt states it's just not comfortable. Pt states he can't tell a difference since using the cpap   CC: Edward Frank, [facility], Dr. Isabelle Frank neurology  History of Present Illness: Edward Frank is a 65 y.o. male moderate sleep apnea with CPAP 7 cm H2O.  He uses his CPAP about 6 to 8 hours per night.  He has nasal pillow mask.  His mouth gets dry.  He has trouble with his tube getting stuck around his bed.  He is not sure if this has helped his sleep, but his wife no longer hears him snoring.    He has been enrolled in a weight loss program with the [place] and has lost 7 lbs since visit in October.    Past Medical History  Diagnosis Date  . Hyperlipidemia   . Hypertension   . Bradycardia   . GERD (gastroesophageal reflux disease)   . Fatty liver   . Syncope   . OSA (obstructive sleep apnea) 01/22/2011  . Primary lateral sclerosis   . Right bundle branch block     Past Surgical History  Procedure Date  . Shoulder surgery 1977    Luxating   . Hernia repair 12-2001    inguinal hernia bilateral  . Foot surgery 11-2008    hammer toe and bunion    Current Outpatient Prescriptions on File Prior to Visit  Medication Sig Dispense Refill  . b complex vitamins capsule Take 1 capsule by mouth daily.        . Calcium Carbonate-Vitamin D (CALCIUM + D) 600-200 MG-UNIT TABS Take 1 capsule by mouth daily.        . Cholecalciferol (VITAMIN D3) 1000 UNITS CAPS Take 1 capsule by mouth daily.        [name] Co-Enzyme Q-10 100 MG CAPS Take 4 capsules by mouth daily.        . Cranberry 250 MG TABS Take 1 tablet by mouth daily.        . indomethacin (INDOCIN) 25 MG capsule Take 25 mg by mouth 2 (two) times daily with a meal.        . losartan (COZAAR) 50 MG tablet Take 50 mg by mouth daily.        . methocarbamol (ROBAXIN) 750 MG tablet Take 1,500 mg by mouth daily.       . Omega-3  Fatty Acids (FISH OIL TRIPLE STRENGTH) 1400 MG CAPS Take 2 capsules by mouth 2 (two) times daily.        [name] omeprazole (PRILOSEC) 20 MG capsule Take 20 mg by mouth daily.        . pravastatin (PRAVACHOL) 20 MG tablet TAKE ONE TABLET BY MOUTH EVERY DAY  90 tablet  11  . RESVERATROL 100 MG CAPS Take 1 capsule by mouth daily.          No Known Allergies  Physical Exam:  Blood pressure 112/74, pulse 65, temperature 97.3 F (36.3 C), temperature source Oral, height 6' (1.829 m), weight 225 lb 9.6 oz (102.331 kg), SpO2 97.00%. Body mass index is 30.60 kg/(m^2). Wt Readings from Last 3 Encounters:  04/01/11 225 lb 9.6 oz (102.331 kg)  03/04/11 231 lb 6.4 oz (104.962 kg)  01/22/11 232 lb 12.8 oz (105.597 kg)    General - Obese  HEENT - PERRLA, EOMI, wears glasses, MP 3, no oral exudate, no  LAN, no thyromegaly  Cardiac - s1s2 regular, no murmur  Chest - CTA  Abd - soft, nontender  Ext - no e/c/c  Neuro - CN intact  Psych - normal mood/behavior    Assessment/Plan:  OSA (obstructive sleep apnea) He report compliance with therapy.  He is having some trouble with mouth dryness, and discussed techniques to help with this.  Will call him with report of CPAP download, and then discuss if any changes to his pressure setting are needed.  Encouraged him to continue with his weight loss.  Explained that if he continues with weight loss, then may be able to consider whether he needs to continue with CPAP therapy.     Outpatient Encounter Prescriptions as of 04/01/2011  Medication Sig Dispense Refill  . b complex vitamins capsule Take 1 capsule by mouth daily.        . Calcium Carbonate-Vitamin D (CALCIUM + D) 600-200 MG-UNIT TABS Take 1 capsule by mouth daily.        . Cholecalciferol (VITAMIN D3) 1000 UNITS CAPS Take 1 capsule by mouth daily.        [name] Co-Enzyme Q-10 100 MG CAPS Take 4 capsules by mouth daily.        . Cranberry 250 MG TABS Take 1 tablet by mouth daily.        . indomethacin  (INDOCIN) 25 MG capsule Take 25 mg by mouth 2 (two) times daily with a meal.        . losartan (COZAAR) 50 MG tablet Take 50 mg by mouth daily.        . methocarbamol (ROBAXIN) 750 MG tablet Take 1,500 mg by mouth daily.       . Omega-3 Fatty Acids (FISH OIL TRIPLE STRENGTH) 1400 MG CAPS Take 2 capsules by mouth 2 (two) times daily.        [name] omeprazole (PRILOSEC) 20 MG capsule Take 20 mg by mouth daily.        . pravastatin (PRAVACHOL) 20 MG tablet TAKE ONE TABLET BY MOUTH EVERY DAY  90 tablet  11  . RESVERATROL 100 MG CAPS Take 1 capsule by mouth daily.          Brittnye Josephs Pager:  [phone] 04/01/2011, 2:28 PM

## 2011-04-11 ENCOUNTER — Encounter: Payer: Self-pay | Admitting: Pulmonary Disease

## 2011-04-11 ENCOUNTER — Telehealth: Payer: Self-pay | Admitting: Pulmonary Disease

## 2011-04-11 DIAGNOSIS — G4733 Obstructive sleep apnea (adult) (pediatric): Secondary | ICD-10-CM

## 2011-04-11 NOTE — Telephone Encounter (Signed)
CPAP 02/19/11 to 03/20/11>>Used on 29 of 30 nights with average 7 hrs 46 min.  Device does not report AHI, leak, or pressure setting.  Discussed with pt's wife.  Will arrange for auto CPAP range 5 to 15 cm H2O and call with results.

## 2011-06-06 ENCOUNTER — Other Ambulatory Visit: Payer: Self-pay | Admitting: Cardiology

## 2011-06-06 MED ORDER — LOSARTAN POTASSIUM 50 MG PO TABS: 50.0000 mg | ORAL_TABLET | Freq: Every day | ORAL | Status: DC

## 2011-07-01 ENCOUNTER — Ambulatory Visit (INDEPENDENT_AMBULATORY_CARE_PROVIDER_SITE_OTHER): Payer: Medicare Other | Admitting: Cardiology

## 2011-07-01 ENCOUNTER — Encounter: Payer: Self-pay | Admitting: Cardiology

## 2011-07-01 VITALS — BP 142/92 | HR 54 | Ht 72.0 in | Wt 223.0 lb

## 2011-07-01 DIAGNOSIS — R55 Syncope and collapse: Secondary | ICD-10-CM

## 2011-07-01 DIAGNOSIS — I10 Essential (primary) hypertension: Secondary | ICD-10-CM

## 2011-07-01 MED ORDER — LOSARTAN POTASSIUM 50 MG PO TABS: 50.0000 mg | ORAL_TABLET | Freq: Every day | ORAL | Status: DC

## 2011-07-01 NOTE — Patient Instructions (Addendum)
Your physician wants you to follow-up in: 1 year with Dr McLean. (March 2014).You will receive a reminder letter in the mail two months in advance. If you don't receive a letter, please call our office to schedule the follow-up appointment.  

## 2011-07-02 DIAGNOSIS — R55 Syncope and collapse: Secondary | ICD-10-CM | POA: Insufficient documentation

## 2011-07-02 NOTE — Progress Notes (Signed)
PCP: Dr. Ermalene Searing  66 yo with history of primary lateral sclerosis with muscle weakness, syncope/presyncope, and HTN presents for cardiology followup. I have seen him in the past because of episodes of neurocardiogenic syncope with micturation and cough.  He has had no recent syncopal episodes.  BP is mildly elevated today but in general has been ok.  No chest pain.  He walks about 1/2 mile daily without exertional dyspnea.  His PLS has been slowly progressive.  He is currently taking Indocin twice a day for chronic headache.   ECG: NSR, right axis deviation, incomplete RBBB  Labs (11/12): LDL 65, HDL 48, K 4.4, creatinine 1.2  Past Medical History:  1. HYPERCHOLESTEROLEMIA  2. HTN: suspected ACEI cough.  3. BRADYCARDIA: Mild sinus bradycardia  4. Syncope/presyncope: Suspect neurocardiogenic. Situation syncope/presyncope with micturation and cough.  5. PLS: [facility] [place]. Motor weakness.  6. GERD  7. Fatty liver.  8. PFTs (11/11) were normal.  9. Echo (2/12): EF 55-60%, no regional wall motion abnormalities, mild RV dilation with low normal systolic function. No TR doppler jet so unable to estimate PA systolic pressure.   Family History:  Family History of Arthritis  Family History Diabetes 2nd degree relative  Family History High cholesterol  Family History Hypertension  Family History of Prostate CA 1st degree relative <50  Family History of Cardiovascular disorder  Dad: prostate cancer, HTN, malignant, renal failure, TIA  Mom: arrythmia.Marland Kitchenatrial fibrillation? Got PCM.  2 sisters with pacemakers   Social History:  Retired, former International aid/development worker  Married, lives in [place]  Never Smoked  Alcohol use-one beverage every 2 weeks.  Drug use-no  Regular exercise-yes..walking to mail box  Diet: fruits and veggies, water  Agent Orange exposure in [place]   Review of Systems  All systems reviewed and negative except as per HPI.   Current Outpatient  Prescriptions  Medication Sig Dispense Refill  . b complex vitamins capsule Take 1 capsule by mouth daily.        . Calcium Carbonate-Vitamin D (CALCIUM + D) 600-200 MG-UNIT TABS Take 1 capsule by mouth daily.        . Cholecalciferol (VITAMIN D3) 1000 UNITS CAPS Take 1 capsule by mouth daily.        [name] Co-Enzyme Q-10 100 MG CAPS Take 4 capsules by mouth daily.        . Cranberry 250 MG TABS Take 1 tablet by mouth daily.        . indomethacin (INDOCIN) 25 MG capsule Take 25 mg by mouth 2 (two) times daily with a meal.        . losartan (COZAAR) 50 MG tablet Take 1 tablet (50 mg total) by mouth daily.  90 tablet  3  . methocarbamol (ROBAXIN) 750 MG tablet Take 1,500 mg by mouth daily.       . Omega-3 Fatty Acids (FISH OIL TRIPLE STRENGTH) 1400 MG CAPS Take 2 capsules by mouth 2 (two) times daily.        [name] omeprazole (PRILOSEC) 20 MG capsule Take 20 mg by mouth daily.        . pravastatin (PRAVACHOL) 20 MG tablet TAKE ONE TABLET BY MOUTH EVERY DAY  90 tablet  11  . RESVERATROL 100 MG CAPS Take 1 capsule by mouth daily.          BP 142/92  Pulse 54  Ht 6' (1.829 m)  Wt 223 lb (101.152 kg)  BMI 30.24 kg/m2 General: NAD,  overweight.  Neck: No JVD, no thyromegaly or thyroid nodule.  Lungs: Clear to auscultation bilaterally with normal respiratory effort. CV: Nondisplaced PMI.  Heart regular S1/S2, no S3/S4, no murmur.  No peripheral edema.  No carotid bruit.  Normal pedal pulses.  Abdomen: Soft, nontender, no hepatosplenomegaly, no distention.  Neurologic: Alert and oriented x 3.  Psych: Normal affect. Extremities: No clubbing or cyanosis.

## 2011-07-02 NOTE — Assessment & Plan Note (Signed)
BP borderline elevated today.  He says that in general, it has been doing well.  He is, of note, taking Indocin daily because of headaches.  I asked him to talk to his neurologist to see if there is another medication option for him.  I would prefer that he not take twice daily Indocin as it can cause the blood pressure to rise, affect renal function, and increase risk of PUD.

## 2011-07-02 NOTE — Assessment & Plan Note (Signed)
History of neurocardiogenic syncope with micturation and cough.  No episodes recently.

## 2011-07-03 ENCOUNTER — Encounter: Payer: Self-pay | Admitting: Pulmonary Disease

## 2011-07-03 ENCOUNTER — Telehealth: Payer: Self-pay | Admitting: Pulmonary Disease

## 2011-07-03 DIAGNOSIS — G4733 Obstructive sleep apnea (adult) (pediatric): Secondary | ICD-10-CM

## 2011-07-03 NOTE — Telephone Encounter (Signed)
lmomtcb x1 

## 2011-07-03 NOTE — Telephone Encounter (Signed)
Auto CPAP 06/17/11 to 06/30/11>>Used on 7 of 14 nights with average median 6 hrs 54 min.  Average AHI 6.9 with median CPAP 6 cm H2O and 95th Percentile CPAP 7 cm H2O.  No significant airleak.  Will have my nurse inform patient that CPAP report showed he had good control of apnea with low pressure setting.  I have sent order to decrease CPAP from 7 to 6 cm H2O.  He is to call back if he still has trouble with mouth dryness with lower pressure setting.

## 2011-07-04 NOTE — Telephone Encounter (Signed)
lmomtcb x 2  

## 2011-07-04 NOTE — Telephone Encounter (Signed)
I spoke with spouse and is aware of results. She voiced her understanding and had no questions 

## 2011-09-02 ENCOUNTER — Telehealth: Payer: Self-pay | Admitting: Family Medicine

## 2011-09-02 ENCOUNTER — Other Ambulatory Visit (INDEPENDENT_AMBULATORY_CARE_PROVIDER_SITE_OTHER): Payer: Medicare Other

## 2011-09-02 DIAGNOSIS — Z0000 Encounter for general adult medical examination without abnormal findings: Secondary | ICD-10-CM

## 2011-09-02 DIAGNOSIS — I10 Essential (primary) hypertension: Secondary | ICD-10-CM

## 2011-09-02 DIAGNOSIS — E7800 Pure hypercholesterolemia, unspecified: Secondary | ICD-10-CM

## 2011-09-02 LAB — LIPID PANEL
Cholesterol: 138 mg/dL (ref 0–200)
HDL: 41.3 mg/dL (ref >39.00)
Triglycerides: 142 mg/dL (ref 0.0–149.0)
VLDL: 28.4 mg/dL (ref 0.0–40.0)

## 2011-09-02 LAB — CBC WITH DIFFERENTIAL/PLATELET
Eosinophils Relative: 4.9 % (ref 0.0–5.0)
HCT: 48.1 % (ref 39.0–52.0)
Hemoglobin: 16 g/dL (ref 13.0–17.0)
Lymphs Abs: 2.7 10*3/uL (ref 0.7–4.0)
MCV: 96.6 fl (ref 78.0–100.0)
Monocytes Absolute: 0.8 10*3/uL (ref 0.1–1.0)
Monocytes Relative: 10 % (ref 3.0–12.0)
Neutro Abs: 3.9 10*3/uL (ref 1.4–7.7)
RDW: 14.6 % (ref 11.5–14.6)
WBC: 7.9 10*3/uL (ref 4.5–10.5)

## 2011-09-02 LAB — COMPREHENSIVE METABOLIC PANEL
CO2: 23 mEq/L (ref 19–32)
Creatinine, Ser: 1 mg/dL (ref 0.4–1.5)
GFR: 79.98 mL/min (ref >60.00)
Glucose, Bld: 90 mg/dL (ref 70–99)
Total Bilirubin: 0.4 mg/dL (ref 0.3–1.2)

## 2011-09-02 NOTE — Telephone Encounter (Signed)
Future labs for Dr Ermalene Searing

## 2011-09-09 ENCOUNTER — Encounter: Payer: Medicare Other | Admitting: Family Medicine

## 2011-09-20 ENCOUNTER — Encounter: Payer: Self-pay | Admitting: Family Medicine

## 2011-09-20 ENCOUNTER — Ambulatory Visit (INDEPENDENT_AMBULATORY_CARE_PROVIDER_SITE_OTHER): Payer: Medicare Other | Admitting: Family Medicine

## 2011-09-20 VITALS — BP 122/78 | HR 61 | Temp 97.7°F | Resp 12 | Wt 217.0 lb

## 2011-09-20 DIAGNOSIS — E7800 Pure hypercholesterolemia, unspecified: Secondary | ICD-10-CM

## 2011-09-20 DIAGNOSIS — Z0000 Encounter for general adult medical examination without abnormal findings: Secondary | ICD-10-CM

## 2011-09-20 DIAGNOSIS — I10 Essential (primary) hypertension: Secondary | ICD-10-CM

## 2011-09-20 DIAGNOSIS — M48061 Spinal stenosis, lumbar region without neurogenic claudication: Secondary | ICD-10-CM

## 2011-09-20 DIAGNOSIS — Z125 Encounter for screening for malignant neoplasm of prostate: Secondary | ICD-10-CM

## 2011-09-20 MED ORDER — TRAMADOL HCL 50 MG PO TABS: 50.0000 mg | ORAL_TABLET | Freq: Four times a day (QID) | ORAL | Status: AC | PRN

## 2011-09-20 NOTE — Assessment & Plan Note (Signed)
Well controlled. Continue current medication.  

## 2011-09-20 NOTE — Assessment & Plan Note (Signed)
No surgical indication per pt per [facility]. Will treat with tramadol for pain, (will try hydrocodone he has leftover at home first.) Heat, massage, stretching. Follow up if not improving.

## 2011-09-20 NOTE — Patient Instructions (Addendum)
Heat, stretching, massage for low back pain. Okay to try tramadol for pain. Limit, if stomach irritation call. Stop at lab on your way out... We will call with results.  Return medicare paperwork. Follow up in 6 months.

## 2011-09-20 NOTE — Progress Notes (Signed)
Subjective:    Patient ID: Edward Frank, male    DOB: [birth year], 66 y.o.   MRN: [id number]  HPI 66 year old male presents for medicare wellness. I have personally reviewed the Medicare Annual Wellness questionnaire and have noted 1. [name] medical and social history 2. Their use of alcohol, tobacco or illicit drugs 3. Their current medications and supplements 4. [name] functional ability including ADL's, fall risks, home safety risks and hearing or visual             impairment. 5. Diet and physical activities 6. Evidence for depression or mood disorders The patients weight, height, BMI and visual acuity have been recorded in the chart I have made referrals, counseling and provided education to the patient based review of the above and I have provided the pt with a written personalized care plan for preventive services.  PLS: Moderate change in follow up few months ago at [facility]. Pt doing fairly well.     Hypertension: Well controlled on losartan.  RBBB, bradycardia Followed by Cards, Dr. Shirlee Latch.  Using medication without problems or lightheadedness:  Chest pain with exertion: No  Edema:stable  Short of breath: stable... [facility]  Average home BPs: 122/70 at [place], but normal at home.  Other issues:   Sleep apnea dx with pulm. Set up with CPAP, tolerating moderately well, but turining back in since medicare not paying for this..No change in energy.   Elevated Cholesterol: On pravachol 20. Now on fish oil... LDL at goal!, Tri at goal. Lab Results  Component Value Date   CHOL 138 09/02/2011   HDL 41.30 09/02/2011   LDLCALC 68 09/02/2011   LDLDIRECT 125.8 10/26/2009   TRIG 142.0 09/02/2011   CHOLHDL 3 09/02/2011   Using medications without problems:None  Muscle aches: None  Diet compliance: Great control, low carb... Has lost 20 lbs intentionally at 4  Exercise:Limited by ALS. Walking to [employer].  Other complaints:  Prediabetes improved from last check.  Low back pain  following fall in 04/2011. Had [facility] spine [facility]: stenosis of nerve root. No associated radicular pain. Worse later in day after moving around. 4-5/10. Daily in last week after laying brick walkway.  On indocin for headache from Neuro, Dr. Hyacinth Meeker.  Has lost over 25 lbs with [employer].  Review of Systems  Constitutional: Negative for fever, fatigue and unexpected weight change.  HENT: Negative for ear pain, congestion, sore throat, rhinorrhea, trouble swallowing and postnasal drip.   Eyes: Negative for pain.  Respiratory: Negative for cough, shortness of breath and wheezing.   Cardiovascular: Negative for chest pain, palpitations and leg swelling.  Gastrointestinal: Negative for nausea, abdominal pain, diarrhea, constipation and blood in stool.  Genitourinary: Negative for dysuria, urgency, hematuria, discharge, penile swelling, scrotal swelling, difficulty urinating, penile pain and testicular pain.  Skin: Negative for rash.  Neurological: Negative for syncope, weakness, light-headedness, numbness and headaches.  Psychiatric/Behavioral: Negative for behavioral problems and dysphoric mood. The patient is not nervous/anxious.        Objective:   Physical Exam  Constitutional: He appears well-developed and well-nourished.  Non-toxic appearance. He does not appear ill. No distress.  HENT:  Head: Normocephalic and atraumatic.  Right Ear: Hearing, tympanic membrane, external ear and ear canal normal.  Left Ear: Hearing, tympanic membrane, external ear and ear canal normal.  Nose: Nose normal.  Mouth/Throat: Uvula is midline, oropharynx is clear and moist and mucous membranes are normal.  Eyes: Conjunctivae, EOM and lids are normal. Pupils  are equal, round, and reactive to light. No foreign bodies found.  Neck: Trachea normal, normal range of motion and phonation normal. Neck supple. Carotid bruit is not present. No mass and no thyromegaly present.  Cardiovascular: Normal rate, regular  rhythm, S1 normal, S2 normal, intact distal pulses and normal pulses.  Exam reveals no gallop.   No murmur heard. Pulmonary/Chest: Breath sounds normal. He has no wheezes. He has no rhonchi. He has no rales.  Abdominal: Soft. Normal appearance and bowel sounds are normal. There is no hepatosplenomegaly. There is no tenderness. There is no rebound, no guarding and no CVA tenderness. No hernia.  Genitourinary: Prostate normal. Rectal exam shows no external hemorrhoid, no internal hemorrhoid, no fissure, no mass, no tenderness and anal tone normal. Guaiac negative stool. Prostate is not enlarged and not tender.  Musculoskeletal:       Lumbar back: He exhibits decreased range of motion and tenderness. He exhibits no bony tenderness.  Lymphadenopathy:    He has no cervical adenopathy.  Neurological: He is alert. He has normal reflexes. He displays atrophy. No cranial nerve deficit or sensory deficit. He exhibits abnormal muscle tone. Gait normal.       Chronic changes from PLS  Skin: Skin is warm, dry and intact. No rash noted.  Psychiatric: He has a normal mood and affect. His speech is normal and behavior is normal. Judgment normal.          Assessment & Plan:  [name] preventative maintenance and recommended screening tests for an annual wellness exam were reviewed in full today. Brought up to date unless services declined.  Counselled on the importance of diet, exercise, and its role in overall health and mortality. [name] FH and SH was reviewed, including their home life, tobacco status, and drug and alcohol status.   Vaccines: uptodate with TD, PNA, shingles Prostate: PSA nml 6 months ago. Colon: 07/2009, repeat in 10 years.

## 2011-10-02 ENCOUNTER — Ambulatory Visit (INDEPENDENT_AMBULATORY_CARE_PROVIDER_SITE_OTHER): Payer: Medicare Other | Admitting: Pulmonary Disease

## 2011-10-02 ENCOUNTER — Encounter: Payer: Self-pay | Admitting: Pulmonary Disease

## 2011-10-02 VITALS — BP 140/70 | HR 53 | Temp 97.6°F | Ht 72.0 in | Wt 217.8 lb

## 2011-10-02 DIAGNOSIS — G4733 Obstructive sleep apnea (adult) (pediatric): Secondary | ICD-10-CM

## 2011-10-02 NOTE — Patient Instructions (Signed)
Follow up in 6 months 

## 2011-10-02 NOTE — Progress Notes (Signed)
Chief Complaint  Patient presents with  . Sleep Apnea    Pt hasnt been  using cpap for weeks due to insurance reason .   CC: Edward Frank, Dr. Franc[facility], Dr. Isabelle Frank neurology  History of Present Illness: Edward Frank is a 66 y.o. male moderate sleep apnea with CPAP 6 cm H2O.  Auto CPAP 06/17/11 to 06/30/11>>Used on 7 of 14 nights with average median 6 hrs 54 min. Average AHI 6.9 with median CPAP 6 cm H2O and 95th Percentile CPAP 7 cm H2O. No significant airleak.  He is no longer using CPAP.  He was told that medicare would no long pay for his supplies because proper documentation was not done by referring provider prior to having sleep study.  Since his last visit he has lost almost 15 lbs by adjusting his diet.  He feels this has helped his sleep.  He is not interested in starting CPAP again.  He would consider oral appliance if needed.    Past Medical History  Diagnosis Date  . Hyperlipidemia   . Hypertension   . Bradycardia   . GERD (gastroesophageal reflux disease)   . Fatty liver   . Syncope   . OSA (obstructive sleep apnea) 01/22/2011  . Primary lateral sclerosis   . Right bundle branch block     Past Surgical History  Procedure Date  . Shoulder surgery 1977    Luxating   . Hernia repair 12-2001    inguinal hernia bilateral  . Foot surgery 11-2008    hammer toe and bunion      No Known Allergies  Physical Exam:  Blood pressure 140/70, pulse 53, temperature 97.6 F (36.4 C), temperature source Oral, height 6' (1.829 m), weight 217 lb 12.8 oz (98.793 kg), SpO2 98.00%.  Body mass index is 29.54 kg/(m^2).  Wt Readings from Last 3 Encounters:  10/02/11 217 lb 12.8 oz (98.793 kg)  09/20/11 217 lb (98.431 kg)  07/01/11 223 lb (101.152 kg)    General - Obese  HEENT - PERRLA, EOMI, wears glasses, MP 3, no oral exudate, no LAN, no thyromegaly  Cardiac - s1s2 regular, no murmur  Chest - CTA  Abd - soft, nontender  Ext - no e/c/c    Neuro - CN intact  Psych - normal mood/behavior    Assessment/Plan:    Outpatient Encounter Prescriptions as of 10/02/2011  Medication Sig Dispense Refill  . b complex vitamins capsule Take 1 capsule by mouth daily.        . Cholecalciferol (VITAMIN D3) 1000 UNITS CAPS Take 1 capsule by mouth daily.        [name] Co-Enzyme Q-10 100 MG CAPS Take 4 capsules by mouth daily.        . Cranberry 250 MG TABS Take 1 tablet by mouth daily.        . indomethacin (INDOCIN) 25 MG capsule Take 25 mg by mouth 2 (two) times daily with a meal.        . losartan (COZAAR) 50 MG tablet Take 1 tablet (50 mg total) by mouth daily.  90 tablet  3  . Omega-3 Fatty Acids (FISH OIL TRIPLE STRENGTH) 1400 MG CAPS Take 2 capsules by mouth 2 (two) times daily.        [name] omeprazole (PRILOSEC) 20 MG capsule Take 20 mg by mouth daily.        . pravastatin (PRAVACHOL) 20 MG tablet TAKE ONE TABLET BY MOUTH EVERY DAY  90 tablet  11  . RESVERATROL 100 MG CAPS Take 1 capsule by mouth daily.        . traMADol (ULTRAM) 50 MG tablet Take 50 mg by mouth every 6 (six) hours as needed.      . Calcium Carbonate-Vitamin D (CALCIUM + D) 600-200 MG-UNIT TABS Take 1 capsule by mouth daily.        . methocarbamol (ROBAXIN) 750 MG tablet Take 1,500 mg by mouth daily.         Edward Frank Pager:  [phone] 10/02/2011, 11:50 AM

## 2011-10-02 NOTE — Assessment & Plan Note (Signed)
He is no longer using CPAP due to difficulties with insurance coverage.  He is sleeping better since has lost weight.  He would like to continue weight loss, and then re-assess whether he needs to restart therapy for sleep apnea.  He would not want to use CPAP, but would be willing to try oral appliance if needed.  I have advised him to discuss with [facility] and his [facility] about process for getting oral appliance if needed.

## 2011-11-06 ENCOUNTER — Other Ambulatory Visit: Payer: Self-pay | Admitting: Family Medicine

## 2012-03-11 ENCOUNTER — Telehealth: Payer: Self-pay | Admitting: Pulmonary Disease

## 2012-03-11 NOTE — Telephone Encounter (Signed)
Called pt to schd 6 month follow up apt with Sood. He stated he does not want to see him at this time. And will call us when he needs to.

## 2012-04-07 ENCOUNTER — Ambulatory Visit: Payer: Medicare Other | Admitting: Family Medicine

## 2012-06-11 ENCOUNTER — Encounter (HOSPITAL_COMMUNITY): Payer: Self-pay | Admitting: Pharmacist

## 2012-06-16 ENCOUNTER — Encounter (HOSPITAL_COMMUNITY): Payer: Self-pay

## 2012-06-16 ENCOUNTER — Encounter (HOSPITAL_COMMUNITY)
Admission: RE | Admit: 2012-06-16 | Discharge: 2012-06-16 | Disposition: A | Discharge status: Home / Self Care | Payer: Medicare Other | Source: Ambulatory Visit | Attending: Orthopedic Surgery | Admitting: Orthopedic Surgery

## 2012-06-16 HISTORY — DX: Unspecified fracture of shaft of unspecified tibia, initial encounter for closed fracture: S82.209A

## 2012-06-16 HISTORY — DX: Unspecified fracture of shaft of unspecified fibula, initial encounter for closed fracture: S82.409A

## 2012-06-16 HISTORY — DX: Unspecified osteoarthritis, unspecified site: M19.90

## 2012-06-16 LAB — CBC
HCT: 46.8 % (ref 39.0–52.0)
Platelets: 185 10*3/uL (ref 150–400)
RDW: 14 % (ref 11.5–15.5)
WBC: 7.3 10*3/uL (ref 4.0–10.5)

## 2012-06-16 LAB — BASIC METABOLIC PANEL
BUN: 19 mg/dL (ref 6–23)
Chloride: 103 mEq/L (ref 96–112)
GFR calc Af Amer: 87 mL/min — ABNORMAL LOW (ref >90)
Potassium: 4.8 mEq/L (ref 3.5–5.1)
Sodium: 140 mEq/L (ref 135–145)

## 2012-06-16 LAB — SURGICAL PCR SCREEN: Staphylococcus aureus: NEGATIVE

## 2012-06-16 NOTE — Progress Notes (Signed)
Primary MD: Dr. Ermalene Searing, Corinda Gubler at [facility], Dr. Donneta Romberg at [facility]  Cardiologist: Dr. Morene Antu at [address]  Neurologist: Dr. Hyacinth Meeker at [facility] in [facility] and Dr. Macario Carls [facility]  Requested echo from [facility]. Pt. Denies cath/stress test.

## 2012-06-16 NOTE — Pre-Procedure Instructions (Signed)
Bostyn Bogie Carvey  06/16/2012   Your procedure is scheduled on:  Thursday, Feb. 27,2014  Report to [facility] at 10:00 AM.  Call this number if you have problems the morning of surgery: [phone]   Remember:   Do not eat food or drink liquids after midnight.   Take these medicines the morning of surgery with A SIP OF WATER: losartan, prilosec,tramadol   Do not wear jewelry, make-up or nail polish.  Do not wear lotions, powders, or perfumes. You may wear deodorant.  Do not shave 48 hours prior to surgery. Men may shave face and neck.  Do not bring valuables to the hospital.  Contacts, dentures or bridgework may not be worn into surgery.  Leave suitcase in the car. After surgery it may be brought to your room.  For patients admitted to the hospital, checkout time is 11:00 AM the day of  discharge.   Patients discharged the day of surgery will not be allowed to drive  home.  Name and phone number of your driver: deborah Lueck  Special Instructions: Shower using CHG 2 nights before surgery and the night before surgery.  If you shower the day of surgery use CHG.  Use special wash - you have one bottle of CHG for all showers.  You should use approximately 1/3 of the bottle for each shower.   Please read over the following fact sheets that you were given: Pain Booklet, Coughing and Deep Breathing, MRSA Information and Surgical Site Infection Prevention

## 2012-06-17 MED ORDER — LACTATED RINGERS IV SOLN
INTRAVENOUS | Status: DC
  Administered 2012-06-18: 12:00:00 via INTRAVENOUS

## 2012-06-17 MED ORDER — CHLORHEXIDINE GLUCONATE 4 % EX LIQD
60.0000 mL | Freq: Once | CUTANEOUS | Status: DC
  Filled 2012-06-17: qty 60

## 2012-06-17 MED ORDER — DEXTROSE 5 % IV SOLN
3.0000 g | INTRAVENOUS | Status: DC
  Filled 2012-06-17: qty 3000

## 2012-06-17 NOTE — Progress Notes (Signed)
Called [facility] and spoke with Dena (?) in medical records asking if they had received a request for a copy of an Echo pt had had.  She asked if we had looked in EPIC and I told her that we could see that it had been done, but there were no results.  She states she would look further into this and call us back.

## 2012-06-17 NOTE — Progress Notes (Signed)
Anesthesia Chart Review:  Patient is a 67 year old male scheduled for right shoulder arthroscopy, possible rotator cuff repair by Dr. Rennis Chris on 06/18/2012. History includes primary lateral sclerosis, nonsmoker, bradycardia and right BBB, neurocardiogenic syncope, obstructive sleep apnea without CPAP (Dr. Craige Cotta), hypertension, hyperlipidemia, GERD, fatty liver with normal LFTs 09/02/11, headache, arthritis PCP Dr. [name] [facility]) and Dr. Donneta Romberg [facility]).  Neurologist Dr. Hyacinth Meeker [facility]) and Dr. Macario Carls [facility]).  Cardiologist is Dr. Shirlee Latch, last seen on 07/01/11 for follow-up neurocardiogenic syncope.  Patient had not had any recent episodes.  EKG then showed NSR, right axis deviation, incomplete right BBB.  It appeared stable since at least 05/24/10.  By Dr. Alford Highland last note, "Echo (2/12): EF 55-60%, no regional wall motion abnormalities, mild RV dilation with low normal systolic function. No TR doppler jet so unable to estimate PA systolic pressure."  Report already requested from [facility], as it was not found in Epic.   PFTs 02/13/11 showed FEV1 3.58 (103%).  Preoperative labs and CXR noted.  He will be evaluated by his anesthesiologist on the day of surgery.  Would anticipate he could proceed as planned if no acute changes.  Shonna Chock, PA-C 06/17/12 [phone]

## 2012-06-17 NOTE — Progress Notes (Signed)
[facility] Medical records return call.  State that the report is now in EPIC under notes.  Found it and printed it.  Placed in chart.

## 2012-06-18 ENCOUNTER — Encounter (HOSPITAL_COMMUNITY)
Admission: RE | Disposition: A | Discharge status: Home / Self Care | Payer: Self-pay | Source: Ambulatory Visit | Attending: Orthopedic Surgery

## 2012-06-18 ENCOUNTER — Encounter (HOSPITAL_COMMUNITY): Payer: Self-pay | Admitting: *Deleted

## 2012-06-18 ENCOUNTER — Encounter (HOSPITAL_COMMUNITY): Payer: Self-pay | Admitting: Vascular Surgery

## 2012-06-18 ENCOUNTER — Ambulatory Visit (HOSPITAL_COMMUNITY)
Admission: RE | Admit: 2012-06-18 | Discharge: 2012-06-18 | Disposition: A | Discharge status: Home / Self Care | Payer: Medicare Other | Source: Ambulatory Visit | Attending: Orthopedic Surgery | Admitting: Orthopedic Surgery

## 2012-06-18 ENCOUNTER — Ambulatory Visit (HOSPITAL_COMMUNITY): Payer: Medicare Other | Admitting: Anesthesiology

## 2012-06-18 DIAGNOSIS — M19019 Primary osteoarthritis, unspecified shoulder: Secondary | ICD-10-CM | POA: Insufficient documentation

## 2012-06-18 DIAGNOSIS — M719 Bursopathy, unspecified: Secondary | ICD-10-CM | POA: Insufficient documentation

## 2012-06-18 DIAGNOSIS — M67919 Unspecified disorder of synovium and tendon, unspecified shoulder: Secondary | ICD-10-CM | POA: Insufficient documentation

## 2012-06-18 DIAGNOSIS — E785 Hyperlipidemia, unspecified: Secondary | ICD-10-CM | POA: Insufficient documentation

## 2012-06-18 DIAGNOSIS — G4733 Obstructive sleep apnea (adult) (pediatric): Secondary | ICD-10-CM | POA: Insufficient documentation

## 2012-06-18 DIAGNOSIS — I10 Essential (primary) hypertension: Secondary | ICD-10-CM | POA: Insufficient documentation

## 2012-06-18 DIAGNOSIS — Z01818 Encounter for other preprocedural examination: Secondary | ICD-10-CM | POA: Insufficient documentation

## 2012-06-18 DIAGNOSIS — K219 Gastro-esophageal reflux disease without esophagitis: Secondary | ICD-10-CM | POA: Insufficient documentation

## 2012-06-18 DIAGNOSIS — Z01811 Encounter for preprocedural respiratory examination: Secondary | ICD-10-CM | POA: Insufficient documentation

## 2012-06-18 DIAGNOSIS — M25819 Other specified joint disorders, unspecified shoulder: Secondary | ICD-10-CM | POA: Insufficient documentation

## 2012-06-18 DIAGNOSIS — Z01812 Encounter for preprocedural laboratory examination: Secondary | ICD-10-CM | POA: Insufficient documentation

## 2012-06-18 HISTORY — PX: SHOULDER ARTHROSCOPY WITH ROTATOR CUFF REPAIR AND SUBACROMIAL DECOMPRESSION: SHX5686

## 2012-06-18 SURGERY — SHOULDER ARTHROSCOPY WITH ROTATOR CUFF REPAIR AND SUBACROMIAL DECOMPRESSION
Anesthesia: General | Site: Shoulder | Laterality: Right

## 2012-06-18 MED ORDER — LIDOCAINE HCL 4 % MT SOLN
OROMUCOSAL | Status: DC | PRN
  Administered 2012-06-18: 4 mL via TOPICAL

## 2012-06-18 MED ORDER — LIDOCAINE HCL (CARDIAC) 20 MG/ML IV SOLN
INTRAVENOUS | Status: DC | PRN
  Administered 2012-06-18: 40 mg via INTRAVENOUS

## 2012-06-18 MED ORDER — CEFAZOLIN SODIUM-DEXTROSE 2-3 GM-% IV SOLR
2.0000 g | Freq: Once | INTRAVENOUS | Status: AC
  Administered 2012-06-18: 2 g via INTRAVENOUS
  Filled 2012-06-18: qty 50

## 2012-06-18 MED ORDER — ARTIFICIAL TEARS OP OINT
TOPICAL_OINTMENT | OPHTHALMIC | Status: DC | PRN
  Administered 2012-06-18: 1 via OPHTHALMIC

## 2012-06-18 MED ORDER — DEXAMETHASONE SODIUM PHOSPHATE 4 MG/ML IJ SOLN
INTRAMUSCULAR | Status: DC | PRN
  Administered 2012-06-18: 4 mg via INTRAVENOUS

## 2012-06-18 MED ORDER — CEFAZOLIN SODIUM 1-5 GM-% IV SOLN
INTRAVENOUS | Status: AC
  Filled 2012-06-18: qty 100

## 2012-06-18 MED ORDER — FENTANYL CITRATE 0.05 MG/ML IJ SOLN
INTRAMUSCULAR | Status: AC
  Administered 2012-06-18: 100 ug
  Filled 2012-06-18: qty 2

## 2012-06-18 MED ORDER — MIDAZOLAM HCL 2 MG/2ML IJ SOLN
INTRAMUSCULAR | Status: AC
  Administered 2012-06-18: 2 mg
  Filled 2012-06-18: qty 2

## 2012-06-18 MED ORDER — NEOSTIGMINE METHYLSULFATE 1 MG/ML IJ SOLN
INTRAMUSCULAR | Status: DC | PRN
  Administered 2012-06-18: 3 mg via INTRAVENOUS

## 2012-06-18 MED ORDER — ROCURONIUM BROMIDE 100 MG/10ML IV SOLN
INTRAVENOUS | Status: DC | PRN
  Administered 2012-06-18: 40 mg via INTRAVENOUS

## 2012-06-18 MED ORDER — PHENYLEPHRINE HCL 10 MG/ML IJ SOLN
INTRAMUSCULAR | Status: DC | PRN
  Administered 2012-06-18: 40 ug via INTRAVENOUS
  Administered 2012-06-18 (×2): 80 ug via INTRAVENOUS
  Administered 2012-06-18: 40 ug via INTRAVENOUS

## 2012-06-18 MED ORDER — SODIUM CHLORIDE 0.9 % IR SOLN
Status: DC | PRN
  Administered 2012-06-18: 6000 mL

## 2012-06-18 MED ORDER — OXYCODONE-ACETAMINOPHEN 5-325 MG PO TABS: 1.0000 | ORAL_TABLET | ORAL | Status: DC | PRN

## 2012-06-18 MED ORDER — EPHEDRINE SULFATE 50 MG/ML IJ SOLN
INTRAMUSCULAR | Status: DC | PRN
  Administered 2012-06-18: 10 mg via INTRAVENOUS
  Administered 2012-06-18: 5 mg via INTRAVENOUS
  Administered 2012-06-18: 10 mg via INTRAVENOUS

## 2012-06-18 MED ORDER — ONDANSETRON HCL 4 MG/2ML IJ SOLN
INTRAMUSCULAR | Status: DC | PRN
  Administered 2012-06-18: 4 mg via INTRAVENOUS

## 2012-06-18 MED ORDER — PROPOFOL 10 MG/ML IV BOLUS
INTRAVENOUS | Status: DC | PRN
  Administered 2012-06-18: 200 mg via INTRAVENOUS

## 2012-06-18 MED ORDER — GLYCOPYRROLATE 0.2 MG/ML IJ SOLN
INTRAMUSCULAR | Status: DC | PRN
  Administered 2012-06-18: 0.4 mg via INTRAVENOUS

## 2012-06-18 MED ORDER — BUPIVACAINE HCL (PF) 0.25 % IJ SOLN
INTRAMUSCULAR | Status: DC | PRN
  Administered 2012-06-18: 18 mL

## 2012-06-18 MED ORDER — LACTATED RINGERS IV SOLN
INTRAVENOUS | Status: DC | PRN
  Administered 2012-06-18 (×2): via INTRAVENOUS

## 2012-06-18 MED ORDER — TEMAZEPAM 30 MG PO CAPS: 30.0000 mg | ORAL_CAPSULE | Freq: Every evening | ORAL | Status: DC | PRN

## 2012-06-18 SURGICAL SUPPLY — 63 items
ANCH SUT 2 CRKSRW FT 14X4.5 (Anchor) ×1 IMPLANT
ANCH SUT SWLK 19.1X4.75 VT (Anchor) ×2 IMPLANT
ANCHOR PEEK 4.75X19.1 SWLK C (Anchor) ×2 IMPLANT
ANCHOR PEEK CORKSCREW 4.5 (Anchor) ×1 IMPLANT
BLADE CUTTER GATOR 3.5 (BLADE) ×2 IMPLANT
BLADE GREAT WHITE 4.2 (BLADE) ×2 IMPLANT
BLADE SURG 11 STRL SS (BLADE) ×2 IMPLANT
BOOTCOVER CLEANROOM LRG (PROTECTIVE WEAR) ×4 IMPLANT
BUR 3.5 LG SPHERICAL (BURR) IMPLANT
BUR OVAL 4.0 (BURR) ×2 IMPLANT
BURR 3.5 LG SPHERICAL (BURR)
CANISTER SUCT LVC 12 LTR MEDI- (MISCELLANEOUS) ×2 IMPLANT
CANNULA ACUFLEX KIT 5X76 (CANNULA) ×2 IMPLANT
CANNULA DRILOCK 5.0X75 (CANNULA) ×3 IMPLANT
CLOTH BEACON ORANGE TIMEOUT ST (SAFETY) ×2 IMPLANT
CLSR STERI-STRIP ANTIMIC 1/2X4 (GAUZE/BANDAGES/DRESSINGS) ×1 IMPLANT
CONNECTOR 5 IN 1 STRAIGHT STRL (MISCELLANEOUS) ×2 IMPLANT
DRAPE INCISE 23X17 IOBAN STRL (DRAPES) ×1
DRAPE INCISE 23X17 STRL (DRAPES) ×1 IMPLANT
DRAPE INCISE IOBAN 23X17 STRL (DRAPES) ×1 IMPLANT
DRAPE INCISE IOBAN 66X45 STRL (DRAPES) ×2 IMPLANT
DRAPE STERI 35X30 U-POUCH (DRAPES) ×2 IMPLANT
DRAPE SURG 17X11 SM STRL (DRAPES) ×2 IMPLANT
DRAPE U-SHAPE 47X51 STRL (DRAPES) ×2 IMPLANT
DRSG PAD ABDOMINAL 8X10 ST (GAUZE/BANDAGES/DRESSINGS) ×4 IMPLANT
DURAPREP 26ML APPLICATOR (WOUND CARE) ×4 IMPLANT
ELECT REM PT RETURN 9FT ADLT (ELECTROSURGICAL) ×2
ELECTRODE REM PT RTRN 9FT ADLT (ELECTROSURGICAL) ×1 IMPLANT
GLOVE BIO SURGEON STRL SZ7.5 (GLOVE) ×2 IMPLANT
GLOVE BIO SURGEON STRL SZ8 (GLOVE) ×2 IMPLANT
GLOVE EUDERMIC 7 POWDERFREE (GLOVE) ×2 IMPLANT
GLOVE SS BIOGEL STRL SZ 7.5 (GLOVE) ×1 IMPLANT
GLOVE SUPERSENSE BIOGEL SZ 7.5 (GLOVE) ×1
GOWN STRL NON-REIN LRG LVL3 (GOWN DISPOSABLE) ×2 IMPLANT
GOWN STRL REIN XL XLG (GOWN DISPOSABLE) ×8 IMPLANT
KIT BASIN OR (CUSTOM PROCEDURE TRAY) ×2 IMPLANT
KIT ROOM TURNOVER OR (KITS) ×2 IMPLANT
KIT SHOULDER TRACTION (DRAPES) ×2 IMPLANT
MANIFOLD NEPTUNE II (INSTRUMENTS) ×2 IMPLANT
NDL SPNL 18GX3.5 QUINCKE PK (NEEDLE) ×1 IMPLANT
NDL SUT 6 .5 CRC .975X.05 MAYO (NEEDLE) IMPLANT
NEEDLE MAYO TAPER (NEEDLE)
NEEDLE SPNL 18GX3.5 QUINCKE PK (NEEDLE) ×2 IMPLANT
NS IRRIG 1000ML POUR BTL (IV SOLUTION) ×2 IMPLANT
PACK SHOULDER (CUSTOM PROCEDURE TRAY) ×2 IMPLANT
PAD ARMBOARD 7.5X6 YLW CONV (MISCELLANEOUS) ×4 IMPLANT
SET ARTHROSCOPY TUBING (MISCELLANEOUS) ×2
SET ARTHROSCOPY TUBING LN (MISCELLANEOUS) ×1 IMPLANT
SLING ARM FOAM STRAP LRG (SOFTGOODS) IMPLANT
SLING ARM FOAM STRAP MED (SOFTGOODS) ×2 IMPLANT
SLING SWATHE LARGE (SOFTGOODS) ×1 IMPLANT
SPONGE GAUZE 4X4 12PLY (GAUZE/BANDAGES/DRESSINGS) ×2 IMPLANT
SPONGE LAP 4X18 X RAY DECT (DISPOSABLE) ×2 IMPLANT
STRIP CLOSURE SKIN 1/2X4 (GAUZE/BANDAGES/DRESSINGS) ×2 IMPLANT
SUT MNCRL AB 3-0 PS2 18 (SUTURE) ×2 IMPLANT
SUT PDS AB 0 CT 36 (SUTURE) IMPLANT
SUT RETRIEVER GRASP 30 DEG (SUTURE) ×1 IMPLANT
SYR 20CC LL (SYRINGE) ×2 IMPLANT
TAPE PAPER 3X10 WHT MICROPORE (GAUZE/BANDAGES/DRESSINGS) ×2 IMPLANT
TOWEL OR 17X24 6PK STRL BLUE (TOWEL DISPOSABLE) ×2 IMPLANT
TOWEL OR 17X26 10 PK STRL BLUE (TOWEL DISPOSABLE) ×2 IMPLANT
WAND SUCTION MAX 4MM 90S (SURGICAL WAND) ×2 IMPLANT
WATER STERILE IRR 1000ML POUR (IV SOLUTION) ×2 IMPLANT

## 2012-06-18 NOTE — Transfer of Care (Signed)
Immediate Anesthesia Transfer of Care Note  Patient: Edward Frank  Procedure(s) Performed: Procedure(s): RIGHT SHOULDER ARTHROSCOPY WITH SUBACROMIAL DECOMPRESSION AND DISTAL CLAVICLE RESECTION AND ROTATOR CUFF REPAIR (Right)  Patient Location: PACU  Anesthesia Type:General  Level of Consciousness: awake, alert , oriented and patient cooperative  Airway & Oxygen Therapy: Patient Spontanous Breathing and Patient connected to nasal cannula oxygen  Post-op Assessment: Report given to PACU RN and Post -op Vital signs reviewed and stable  Post vital signs: Reviewed and stable  Complications: No apparent anesthesia complications

## 2012-06-18 NOTE — Preoperative (Signed)
Beta Blockers   Reason not to administer Beta Blockers:Not Applicable 

## 2012-06-18 NOTE — H&P (Signed)
Edward Frank    Chief Complaint: RIGHT SHOULDER ROTATOR CUFF TEAR HPI: The patient is a 67 y.o. male with chronic left shoulder pain and impingement with MRI evidence of RCT  Past Medical History  Diagnosis Date  . Hyperlipidemia   . Hypertension   . Bradycardia   . GERD (gastroesophageal reflux disease)   . Fatty liver   . Syncope   . OSA (obstructive sleep apnea) 01/22/2011  . Primary lateral sclerosis   . Right bundle branch block   . Headache   . Arthritis   . Fracture, tibia, with fibula teenage yrs.     no surgery, wore cast    Past Surgical History  Procedure Laterality Date  . Shoulder surgery  1977    Luxating   . Hernia repair  12-2001    inguinal hernia bilateral  . Foot surgery  11-2008    hammer toe and bunion    Family History  Problem Relation Age of Onset  . Heart disease Mother     ? afib and got pacemaker  . Uterine cancer Mother   . Cancer Father     prostate  . Hypertension Father   . Kidney disease Father     failure  . Stroke Father   . Cancer Sister 63    colon cancer    Social History:  reports that he has never smoked. He does not have any smokeless tobacco history on file. He reports that  drinks alcohol. He reports that he does not use illicit drugs.  Allergies: No Known Allergies  Medications Prior to Admission  Medication Sig Dispense Refill  . aspirin EC 81 MG tablet Take 81 mg by mouth daily.      . Coenzyme Q10 (COQ10) 400 MG CAPS Take 400 mg by mouth daily.      . Cranberry 500 MG CAPS Take 500 mg by mouth 2 (two) times daily.       [name] losartan (COZAAR) 50 MG tablet Take 1 tablet (50 mg total) by mouth daily.  90 tablet  3  . methocarbamol (ROBAXIN) 750 MG tablet Take 1,500 mg by mouth at bedtime.       . Multiple Vitamin (MULTIVITAMIN WITH MINERALS) TABS Take 1 tablet by mouth daily.      . Omega-3 Fatty Acids (FISH OIL TRIPLE STRENGTH) 1400 MG CAPS Take 2,800 mg by mouth 2 (two) times daily.       [name] omeprazole (PRILOSEC)  20 MG capsule Take 20 mg by mouth daily.       [name] OVER THE COUNTER MEDICATION Take 2,500 mcg by mouth daily with supper. Vitamin B12      . OVER THE COUNTER MEDICATION Take 1 tablet by mouth daily with supper. Calcium citrate+D 650/500      . pravastatin (PRAVACHOL) 20 MG tablet Take 20 mg by mouth at bedtime.      [name] RESVERATROL 100 MG CAPS Take 100 mg by mouth daily.      [name] tiZANidine (ZANAFLEX) 4 MG tablet Take 4 mg by mouth at bedtime as needed (for muscle spasms).      . traMADol (ULTRAM) 50 MG tablet Take 50 mg by mouth 2 (two) times daily as needed for pain.       . indomethacin (INDOCIN) 25 MG capsule Take 25 mg by mouth 2 (two) times daily with a meal.           Physical Exam: Right shoulder with painful ROM as noted  at recent office visit  Vitals  Temp:  [97.6 F (36.4 C)] 97.6 F (36.4 C) (02/27 1015) Pulse Rate:  [59-62] 60 (02/27 1139) Resp:  [20] 20 (02/27 1139) BP: (128-133)/(81-85) 128/81 mmHg (02/27 1138) SpO2:  [96 %-98 %] 96 % (02/27 1139)  Assessment/Plan  Impression: RIGHT SHOULDER ROTATOR CUFF TEAR  Plan of Action: Procedure(s): RIGHT SHOULDER ARTHROSCOPY WITH SUBACROMIAL DECOMPRESSION AND DISTAL CLAVICLE RESECTION AND ROTATOR CUFF REPAIR  Loc Feinstein M 06/18/2012, 11:43 AM

## 2012-06-18 NOTE — Anesthesia Preprocedure Evaluation (Addendum)
Anesthesia Evaluation  Patient identified by MRN, date of birth, ID band Patient awake    Reviewed: Allergy & Precautions, H&P , NPO status , Patient's Chart, lab work & pertinent test results, reviewed documented beta blocker date and time   History of Anesthesia Complications Negative for: history of anesthetic complications  Airway Mallampati: II TM Distance: >3 FB Neck ROM: full    Dental  (+) Teeth Intact and Dental Advisory Given   Pulmonary shortness of breath, sleep apnea ,  breath sounds clear to auscultation        Cardiovascular hypertension, Pt. on medications + dysrhythmias Rhythm:regular     Neuro/Psych  Headaches, negative psych ROS   GI/Hepatic negative GI ROS, Neg liver ROS, GERD-  Medicated and Controlled,  Endo/Other  negative endocrine ROS  Renal/GU negative Renal ROS  negative genitourinary   Musculoskeletal   Abdominal   Peds  Hematology negative hematology ROS (+)   Anesthesia Other Findings See surgeon's H&P   Reproductive/Obstetrics negative OB ROS                          Anesthesia Physical Anesthesia Plan  ASA: III  Anesthesia Plan: General   Post-op Pain Management:    Induction:   Airway Management Planned: Oral ETT  Additional Equipment:   Intra-op Plan:   Post-operative Plan: Extubation in OR  Informed Consent: I have reviewed the patients History and Physical, chart, labs and discussed the procedure including the risks, benefits and alternatives for the proposed anesthesia with the patient or authorized representative who has indicated his/her understanding and acceptance.   Dental Advisory Given  Plan Discussed with: CRNA and Surgeon  Anesthesia Plan Comments:         Anesthesia Quick Evaluation

## 2012-06-18 NOTE — Anesthesia Procedure Notes (Addendum)
Anesthesia Regional Block:  Interscalene brachial plexus block  Pre-Anesthetic Checklist: ,, timeout performed, Correct Patient, Correct Site, Correct Laterality, Correct Procedure, Correct Position, site marked, Risks and benefits discussed,  Surgical consent,  Pre-op evaluation,  At surgeon's request and post-op pain management  Laterality: Right  Prep: chloraprep       Needles:   Needle Type: Other     Needle Length: 9cm  Needle Gauge: 21    Additional Needles:  Procedures: ultrasound guided (picture in chart) Interscalene brachial plexus block Narrative:  Start time: 06/18/2012 11:21 AM End time: 06/18/2012 11:26 AM Injection made incrementally with aspirations every 5 mL.  Performed by: Personally  Anesthesiologist: Aldona Lento, MD  Additional Notes: Ultrasound guidance used to: id relevant anatomy, confirm needle position, local anesthetic spread, avoidance of vascular puncture. Picture saved. No complications. Block performed personally by Janetta Hora. Gelene Mink, MD    Interscalene brachial plexus block Procedure Name: Intubation Date/Time: 06/18/2012 12:39 PM Performed by: Romie Minus K Pre-anesthesia Checklist: Patient identified, Emergency Drugs available, Suction available, Patient being monitored and Timeout performed Patient Re-evaluated:Patient Re-evaluated prior to inductionOxygen Delivery Method: Circle system utilized Preoxygenation: Pre-oxygenation with 100% oxygen Intubation Type: IV induction Ventilation: Mask ventilation without difficulty Laryngoscope Size: Miller and 2 Grade View: Grade I Tube type: Oral Tube size: 7.5 mm Number of attempts: 1 Airway Equipment and Method: Stylet and LTA kit utilized Placement Confirmation: ETT inserted through vocal cords under direct vision,  positive ETCO2,  CO2 detector and breath sounds checked- equal and bilateral Secured at: 22 cm Tube secured with: Tape Dental Injury: Teeth and Oropharynx as per  pre-operative assessment

## 2012-06-18 NOTE — Op Note (Signed)
06/18/2012  2:18 PM  PATIENT:   Edward Frank  67 y.o. male  PRE-OPERATIVE DIAGNOSIS:  RIGHT SHOULDER ROTATOR CUFF TEAR  POST-OPERATIVE DIAGNOSIS:  Same with impingement, labral tear, AC joint OA  PROCEDURE:  RSA labral debridement, SAD, DCR, RCR  SURGEON:  Doyne Micke, Vania Rea M.D.  ASSISTANTS: Shuford pac   ANESTHESIA:   GET + ISB  EBL: min  SPECIMEN:  none  Drains: none   PATIENT DISPOSITION:  PACU - hemodynamically stable.    PLAN OF CARE: Discharge to home after PACU  Dictation# [id number]

## 2012-06-18 NOTE — Anesthesia Postprocedure Evaluation (Signed)
Anesthesia Post Note  Patient: Edward Frank  Procedure(s) Performed: Procedure(s) (LRB): RIGHT SHOULDER ARTHROSCOPY WITH SUBACROMIAL DECOMPRESSION AND DISTAL CLAVICLE RESECTION AND ROTATOR CUFF REPAIR (Right)  Anesthesia type: General  Patient location: PACU  Post pain: Pain level controlled  Post assessment: Patient's Cardiovascular Status Stable  Last Vitals:  Filed Vitals:   06/18/12 1545  BP: 121/86  Pulse: 77  Temp:   Resp: 14    Post vital signs: Reviewed and stable  Level of consciousness: alert  Complications: No apparent anesthesia complications

## 2012-06-19 NOTE — Op Note (Signed)
[name], Frank NO.:  [id number]  MEDICAL RECORD NO.:  [id number]  LOCATION:  [facility]                         FACILITY:  [facility]  PHYSICIAN:  Vania Rea. Nichola Warren, M.D.  DATE OF BIRTH:  1946-03-31  DATE OF PROCEDURE:  06/18/2012 DATE OF DISCHARGE:  06/18/2012                              OPERATIVE REPORT   PREOPERATIVE DIAGNOSES: 1. Chronic right shoulder pain with impingement syndrome. 2. Right shoulder rotator cuff tear. 3. Right shoulder acromioclavicular joint arthropathy.  POSTOPERATIVE DIAGNOSES: 1. Chronic right shoulder pain with impingement syndrome. 2. Right shoulder rotator cuff tear. 3. Right shoulder acromioclavicular joint arthropathy. 4. Right shoulder complex and extensive degenerative labral tear.  PROCEDURE: 1. Right shoulder examination under anesthesia. 2. Right shoulder diagnostic arthroscopy. 3. Debridement of complex and extensive superior labral tear as well     as limited synovectomy. 4. Arthroscopic subacromial decompression and bursectomy. 5. Arthroscopic distal clavicle resection. 6. Arthroscopic rotator cuff repair using a double row suture bridge     repair construct.  SURGEON:  Vania Rea. Eljay Lave, MD  ASSISTANT:  Lucita Lora. Shuford, PA-C  ANESTHESIA:  General endotracheal as well as an interscalene block.  ESTIMATED BLOOD LOSS:  Minimal.  DRAINS:  None.  HISTORY:  Edward Frank is a 67 year old gentleman, who fell injuring his right shoulder and had continued difficulties with pain, weakness, and functional limitations.  His plain radiographs show evidence for [facility] joint arthropathy with an MRI scan confirming significant tendinosis and a full-thickness tear of the rotator cuff as well as bony impingement. Due to his ongoing pain and functional limitations, he is brought to the operating room at this time for planned right shoulder arthroscopy as described below.  Preoperatively, I counseled [name] on treatment options  as well as risks versus benefits thereof.  Possible surgical complications were reviewed with potential for bleeding, infection, neurovascular injury, persistent pain, loss of motion, anesthetic complication, recurrence of rotator cuff tear, and possible need for additional surgery.  He understands and accepts and agrees to planned procedure.  PROCEDURE IN DETAIL:  After undergoing routine preop evaluation, the patient received prophylactic antibiotics and an interscalene block was established in the holding area by the Anesthesia Department.  Placed supine on the operating table, underwent smooth induction of a general endotracheal anesthesia.  Turned to the left lateral decubitus position on beanbag and appropriately padded and protected.  Right shoulder examination under anesthesia revealed full motion and no instability patterns were noted.  Right arm was then suspended at 70 degrees abduction with 10 pounds of traction.  The right shoulder girdle region was sterilely prepped and draped in standard fashion.  Time-out was called.  Posterior portal was established in glenohumeral joint.  The anterior portal was established under direct visualization.  The glenohumeral articular surfaces were in good condition.  There was moderate synovitis and extensive degenerative tearing of the superior half of the labrum.  I performed a limited synovectomy and also debrided the superior labrum back to stable margin.  There was obvious full- thickness tear of the rotator cuff, with what appeared to be very thin veil of residual bursal or rotator tissue intact nearly across the width of the supraspinatus.  Majority of the supraspinatus was retracted 2-3 cm in a curvilinear fashion.  We completed the debridement of labral tear and obtained hemostasis with the Stryker wand.  There is no evidence for instability patterns.  Biceps anchor was stable.  Biceps tendon was stable proximally and distally.  At  this point, fluid and instruments were removed from the glenohumeral joint.  The arm dropped down to 30 degrees abduction with the arthroscope in the subacromial space.  Posterior portal and a direct lateral portal was established in the subacromial space.  Abundant prolific bursal tissue and multiple adhesions were encountered.  These were all divided and excised with combination of shaver and Stryker wand.  The wand was then used to remove the periosteum from the undersurface of the anterior half of the acromion and there was a very prominent anterior-inferior acromial "hook."  I performed subacromial decompression, creating a type 1 morphology.  Portal was then established directly anterior to the distal clavicle and distal clavicle resection was performed with a bur.  Care was taken to confirm visualization of the entire circumference of the distal clavicle to ensure adequate removal of bone.  We then completed subacromial/subdeltoid bursectomy.  We then debrided the rotator cuff back to a healthy margin of tissue and ultimately had a defect approximately 3 cm in width and then prepared the greater tuberosity, gently abrading the bone with the bur to bleeding bed.  Confirmed the rotator cuff was properly mobilized and easily brought across the bony bed on tuberosity.  At this point, accessory portal device was established and through a stab wound on lateral margin of the acromion placed a Arthrex peek corkscrew suture anchor 4.5.  The suture limbs were then shuttled through the free margin of the rotator cuff in a horizontal mattress pattern and then these were all tied with sliding locking knots followed by multiple overhand throws on alternating posts. We then created "suture bridge" with 2 swivel lock suture anchors, compressing the free margin of the rotator cuff against bony bed of the tuberosity.  Overall construct was much to our satisfaction.  Suture limbs were clipped.  The  bursectomy was then completed.  Final inspection completed.  Fluid and instrument was removed.  [name], PA-C was used as an Geophysicist/field seismologist throughout this case, essential for help with positioning of the extremity, management of the arthroscopic equipment, tissue manipulation, suture management, wound closure, and intraoperative decision making.  The portals closed with Monocryl and Steri-Strips.  Dry dressing was applied.  The right shoulder and right arm was placed in a sling.  The patient was awakened, extubated, and taken to recovery room in stable condition.     Vania Rea. Criag Wicklund, M.D.    KMS/MEDQ  D:  06/18/2012  T:  06/19/2012  Job:  [id number]

## 2012-06-21 ENCOUNTER — Encounter (HOSPITAL_COMMUNITY): Payer: Self-pay | Admitting: Orthopedic Surgery

## 2012-07-17 ENCOUNTER — Telehealth: Payer: Self-pay | Admitting: Pulmonary Disease

## 2012-07-17 ENCOUNTER — Encounter: Payer: Self-pay | Admitting: Cardiology

## 2012-07-17 ENCOUNTER — Ambulatory Visit (INDEPENDENT_AMBULATORY_CARE_PROVIDER_SITE_OTHER): Payer: Medicare Other | Admitting: Cardiology

## 2012-07-17 VITALS — BP 122/86 | HR 54 | Ht 71.0 in | Wt 217.5 lb

## 2012-07-17 DIAGNOSIS — E7800 Pure hypercholesterolemia, unspecified: Secondary | ICD-10-CM

## 2012-07-17 DIAGNOSIS — G4733 Obstructive sleep apnea (adult) (pediatric): Secondary | ICD-10-CM

## 2012-07-17 DIAGNOSIS — R55 Syncope and collapse: Secondary | ICD-10-CM

## 2012-07-17 DIAGNOSIS — E785 Hyperlipidemia, unspecified: Secondary | ICD-10-CM

## 2012-07-17 DIAGNOSIS — I10 Essential (primary) hypertension: Secondary | ICD-10-CM

## 2012-07-17 LAB — LIPID PANEL: Total CHOL/HDL Ratio: 3

## 2012-07-17 MED ORDER — LOSARTAN POTASSIUM 50 MG PO TABS: 50.0000 mg | ORAL_TABLET | Freq: Every day | ORAL | Status: DC

## 2012-07-17 NOTE — Patient Instructions (Addendum)
Lipid Panel today  Follow up with Dr. Craige Cotta.  Your physician wants you to follow-up in: 21 year with Dr. Shirlee Latch.  You will receive a reminder letter in the mail two months in advance. If you don't receive a letter, please call our office to schedule the follow-up appointment.

## 2012-07-17 NOTE — Telephone Encounter (Signed)
lmtcb x1 for pt. 

## 2012-07-19 NOTE — Progress Notes (Signed)
Patient ID: Edward Frank, male   DOB: [birth year], 67 y.o.   MRN: [id number] PCP: Dr. 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 I have seen him in the past because of episodes of neurocardiogenic syncope with micturation and cough.  He has had no recent syncopal episodes.  BP has been well-controlled. No chest pain.  He walks about 1/2 mile daily without exertional dyspnea.  His PLS has been slowly progressive.  He had recent rotator cuff surgery with no problems.  He has OSA but was not able to get coverage for CPAP in the past (I am not sure why, now has Medicare).    ECG: NSR, right axis deviation, incomplete RBBB  Labs (11/12): LDL 65, HDL 48, K 4.4, creatinine 1.2 Labs (5/13): creatinine 1.0, LDL 68, HDL 41  Past Medical History:  1. HYPERCHOLESTEROLEMIA  2. HTN: suspected ACEI cough.  3. BRADYCARDIA: Mild sinus bradycardia  4. Syncope/presyncope: Suspect neurocardiogenic. Situation syncope/presyncope with micturation and cough.  5. PLS: Goes once a year to [facility] in [place]. Motor weakness.  6. GERD  7. Fatty liver.  8. PFTs (11/11) were normal.  9. Echo (2/12): EF 55-60%, no regional wall motion abnormalities, mild RV dilation with low normal systolic function. No TR doppler jet so unable to estimate PA systolic pressure.  10. OSA: Not using CPAP.   Family History:  Family History of Arthritis  Family History Diabetes 2nd degree relative  Family History High cholesterol  Family History Hypertension  Family History of Prostate CA 1st degree relative <50  Family History of Cardiovascular disorder  Dad: prostate cancer, HTN, malignant, renal failure, TIA  Mom: arrythmia.Marland Kitchenatrial fibrillation? Got PCM.  2 sisters with pacemakers   Social History:  Retired, former International aid/development worker  Married, lives in [place]  Never Smoked  Alcohol use-one beverage every 2 weeks.  Drug use-no   Regular exercise-yes..walking to mail box  Diet: fruits and veggies, water  Agent Orange exposure in [place]    Current Outpatient Prescriptions  Medication Sig Dispense Refill  . aspirin EC 81 MG tablet Take 81 mg by mouth daily.      . Coenzyme Q10 (COQ10) 400 MG CAPS Take 400 mg by mouth daily.      . Cranberry 500 MG CAPS Take 500 mg by mouth 2 (two) times daily.       . indomethacin (INDOCIN) 25 MG capsule Take 25 mg by mouth 2 (two) times daily with a meal.        . losartan (COZAAR) 50 MG tablet Take 1 tablet (50 mg total) by mouth daily.  90 tablet  3  . methocarbamol (ROBAXIN) 750 MG tablet Take 1,500 mg by mouth at bedtime.       . Multiple Vitamin (MULTIVITAMIN WITH MINERALS) TABS Take 1 tablet by mouth daily.      . Omega-3 Fatty Acids (FISH OIL TRIPLE STRENGTH) 1400 MG CAPS Take 2,800 mg by mouth 2 (two) times daily.       [name] omeprazole (PRILOSEC) 20 MG capsule Take 20 mg by mouth daily.       [name] OVER THE COUNTER MEDICATION Take 2,500 mcg by mouth daily with supper. Vitamin B12      . OVER THE COUNTER MEDICATION Take 1 tablet by mouth daily with supper. Calcium citrate+D 650/500      . oxyCODONE-acetaminophen (PERCOCET) 5-325 MG per tablet Take 1-2 tablets by mouth every 4 (  four) hours as needed for pain.  40 tablet  0  . pravastatin (PRAVACHOL) 20 MG tablet Take 20 mg by mouth at bedtime.      [name] RESVERATROL 100 MG CAPS Take 100 mg by mouth daily.      . temazepam (RESTORIL) 30 MG capsule Take 1 capsule (30 mg total) by mouth at bedtime as needed for sleep.  30 capsule  1  . tiZANidine (ZANAFLEX) 4 MG tablet Take 4 mg by mouth at bedtime as needed (for muscle spasms).      . traMADol (ULTRAM) 50 MG tablet Take 50 mg by mouth 2 (two) times daily as needed for pain.        No current facility-administered medications for this visit.    BP 122/86  Pulse 54  Ht 5\' 11"  (1.803 m)  Wt 217 lb 8 oz (98.657 kg)  BMI 30.35 kg/m2 General: NAD, overweight.  Neck: No JVD, no  thyromegaly or thyroid nodule.  Lungs: Clear to auscultation bilaterally with normal respiratory effort. CV: Nondisplaced PMI.  Heart regular S1/S2, no S3/S4, no murmur.  No peripheral edema.  No carotid bruit.  Normal pedal pulses.  Abdomen: Soft, nontender, no hepatosplenomegaly, no distention.  Neurologic: Alert and oriented x 3.  Psych: Normal affect. Extremities: No clubbing or cyanosis.   Assessment/Plan: 1. History of syncope: Suspect neurocardiogenic.  No recent episodes.  2. HTN: BP is controlled on current regimen.  3. Hyperlipidemia: Will check lipids.  4. OSA: I will have him go back to see Dr. Craige Cotta again.   [name] 07/19/2012

## 2012-07-20 NOTE — Telephone Encounter (Signed)
Pt is waiting in lobby to speak to a nurse per his request. Edward Frank

## 2012-07-20 NOTE — Telephone Encounter (Signed)
lmomtcb x 2  

## 2012-07-20 NOTE — Telephone Encounter (Signed)
Spoke with pt ,made him aware that he needs to see his dentist to see about  Getting the dental appliance. Pt has medicare and not sure if they will cover it. Per  Dr Ginette Pitman they have trouble getting this thru the dentist to call us back  And we can refer him out.  Pt verbally understood and nothing further was needed.

## 2012-07-23 ENCOUNTER — Telehealth: Payer: Self-pay | Admitting: Cardiology

## 2012-07-23 NOTE — Telephone Encounter (Signed)
Spoke with pt's wife about recent lab results 

## 2012-07-23 NOTE — Telephone Encounter (Signed)
Follow Up   Following up, returning phone call from earlier. Please call.

## 2012-08-01 ENCOUNTER — Other Ambulatory Visit: Payer: Self-pay | Admitting: Family Medicine

## 2012-08-25 ENCOUNTER — Encounter: Payer: Self-pay | Admitting: Pulmonary Disease

## 2012-08-25 ENCOUNTER — Ambulatory Visit (INDEPENDENT_AMBULATORY_CARE_PROVIDER_SITE_OTHER): Payer: Medicare Other | Admitting: Pulmonary Disease

## 2012-08-25 VITALS — BP 110/74 | HR 66 | Temp 97.8°F | Ht 72.0 in | Wt 224.4 lb

## 2012-08-25 DIAGNOSIS — G4733 Obstructive sleep apnea (adult) (pediatric): Secondary | ICD-10-CM

## 2012-08-25 NOTE — Assessment & Plan Note (Signed)
He has history of sleep apnea.  He was not able to tolerate CPAP, and is not willing to try CPAP again.  He attempted to get fitted for oral appliance, but his dentist could not find medicare billing number.  As a result he never got an oral appliance.  He has lost about 25 lbs since his original sleep study, and his sleep quality has improved.  Will repeat sleep study to further assess whether he still has sleep apnea after weight loss.  Depending on results will determine if he needs alternate dental referral.

## 2012-08-25 NOTE — Progress Notes (Signed)
Chief Complaint  Patient presents with  . Follow-up    Follow up per Dr. Shirlee Latch. Pt states his dentists could not find the number to bill medicare for oral appliance so he never got this. Pt reports he sleeps about 5-6 hrs a night. Not tired no more than normal.    CC: [name], Dr. Francine [facility], Dr. Isabelle Course neurology  History of Present Illness: Edward Frank is a 67 y.o. male moderate sleep apnea.  He never was fitted for oral appliance due to difficulty with insurance coverage.  He has lost about 25 lbs.  He is sleeping better.  His wife still reports he gets episodes of shallow breathing, but not as much as before.  He gets about 5 hours of solid sleep, and then light sleep for another 2 hours.  He is not having trouble staying awake during the day.  TESTS: PSG 07/25/10 >> AHI 25, Spo2 low 78%.  CPAP 7 cm H2O. Auto CPAP 06/17/11 to 06/30/11 >> Used on 7 of 14 nights with average median 6 hrs 54 min. Average AHI 6.9 with median CPAP 6 cm H2O and 95th Percentile CPAP 7 cm H2O. No significant airleak.   He  has a past medical history of Hyperlipidemia; Hypertension; Bradycardia; GERD (gastroesophageal reflux disease); Fatty liver; Syncope; OSA (obstructive sleep apnea) (01/22/2011); Primary lateral sclerosis; Right bundle branch block; Headache; Arthritis; and Fracture, tibia, with fibula (teenage yrs.).  He  has past surgical history that includes Shoulder surgery (1977); Hernia repair (12-2001); Foot surgery (11-2008); and Shoulder arthroscopy with rotator cuff repair and subacromial decompression (Right, 06/18/2012).  Current Outpatient Prescriptions on File Prior to Visit  Medication Sig Dispense Refill  . aspirin EC 81 MG tablet Take 81 mg by mouth daily.      . Coenzyme Q10 (COQ10) 400 MG CAPS Take 400 mg by mouth daily.      . Cranberry 500 MG CAPS Take 500 mg by mouth 2 (two) times daily.       . indomethacin (INDOCIN) 25 MG capsule Take 25 mg by mouth 2 (two)  times daily with a meal.        . losartan (COZAAR) 50 MG tablet Take 1 tablet (50 mg total) by mouth daily.  90 tablet  3  . methocarbamol (ROBAXIN) 750 MG tablet Take 1,500 mg by mouth at bedtime.       . Multiple Vitamin (MULTIVITAMIN WITH MINERALS) TABS Take 1 tablet by mouth daily.      . Omega-3 Fatty Acids (FISH OIL TRIPLE STRENGTH) 1400 MG CAPS Take 2,800 mg by mouth 2 (two) times daily.       [name] omeprazole (PRILOSEC) 20 MG capsule Take 20 mg by mouth daily.       [name] OVER THE COUNTER MEDICATION Take 2,500 mcg by mouth daily with supper. Vitamin B12      . OVER THE COUNTER MEDICATION Take 1 tablet by mouth daily with supper. Calcium citrate+D 650/500      . pravastatin (PRAVACHOL) 20 MG tablet Take 20 mg by mouth at bedtime.      . pravastatin (PRAVACHOL) 20 MG tablet TAKE ONE TABLET BY MOUTH EVERY DAY  90 tablet  0  . RESVERATROL 100 MG CAPS Take 100 mg by mouth daily.      [name] tiZANidine (ZANAFLEX) 4 MG tablet Take 4 mg by mouth at bedtime as needed (for muscle spasms).      . traMADol (ULTRAM) 50 MG tablet  Take 50 mg by mouth 2 (two) times daily as needed for pain.        No current facility-administered medications on file prior to visit.    No Known Allergies  Physical Exam:  General - Obese  HEENT - PERRLA, EOMI, wears glasses, MP 3, no oral exudate, no LAN, no thyromegaly  Cardiac - s1s2 regular, no murmur  Chest - CTA  Abd - soft, nontender  Ext - no e/c/c  Neuro - normal strength Psych - normal mood/behavior    Assessment/Plan:  Coralyn Helling, MD [facility] Pulmonary/Critical Care 08/25/2012, 3:11 PM Pager:  [phone] After 3pm call: [phone]

## 2012-08-25 NOTE — Patient Instructions (Signed)
Will arrange for sleep study Will call to arrange for follow up after sleep study reviewed 

## 2012-09-09 ENCOUNTER — Telehealth: Payer: Self-pay

## 2012-09-09 DIAGNOSIS — Z125 Encounter for screening for malignant neoplasm of prostate: Secondary | ICD-10-CM

## 2012-09-09 NOTE — Telephone Encounter (Signed)
pts wife said pt has lab appt on 09/15/12 for CPX labs and request PSA be added to lab test done on 09/15/12.Please advise.

## 2012-09-10 NOTE — Telephone Encounter (Signed)
Edward Frank.. Can we add PSA or does he need to return?

## 2012-09-11 ENCOUNTER — Ambulatory Visit (HOSPITAL_BASED_OUTPATIENT_CLINIC_OR_DEPARTMENT_OTHER): Payer: Medicare Other | Attending: Pulmonary Disease | Admitting: Radiology

## 2012-09-11 VITALS — Ht 72.0 in | Wt 215.0 lb

## 2012-09-11 DIAGNOSIS — I10 Essential (primary) hypertension: Secondary | ICD-10-CM | POA: Insufficient documentation

## 2012-09-11 DIAGNOSIS — G4733 Obstructive sleep apnea (adult) (pediatric): Secondary | ICD-10-CM

## 2012-09-15 ENCOUNTER — Other Ambulatory Visit (INDEPENDENT_AMBULATORY_CARE_PROVIDER_SITE_OTHER): Payer: Medicare Other

## 2012-09-15 ENCOUNTER — Telehealth: Payer: Self-pay | Admitting: Family Medicine

## 2012-09-15 DIAGNOSIS — E7800 Pure hypercholesterolemia, unspecified: Secondary | ICD-10-CM

## 2012-09-15 DIAGNOSIS — Z125 Encounter for screening for malignant neoplasm of prostate: Secondary | ICD-10-CM

## 2012-09-15 DIAGNOSIS — I10 Essential (primary) hypertension: Secondary | ICD-10-CM

## 2012-09-15 LAB — COMPREHENSIVE METABOLIC PANEL
ALT: 20 U/L (ref 0–53)
AST: 24 U/L (ref 0–37)
Albumin: 3.8 g/dL (ref 3.5–5.2)
CO2: 24 mEq/L (ref 19–32)
Calcium: 9.3 mg/dL (ref 8.4–10.5)
Chloride: 106 mEq/L (ref 96–112)
GFR: 73.69 mL/min (ref >60.00)
Potassium: 4.3 mEq/L (ref 3.5–5.1)
Sodium: 138 mEq/L (ref 135–145)
Total Protein: 6.7 g/dL (ref 6.0–8.3)

## 2012-09-15 LAB — LIPID PANEL
HDL: 42.3 mg/dL (ref >39.00)
Total CHOL/HDL Ratio: 3

## 2012-09-15 LAB — PSA, MEDICARE: PSA: 3.51 ng/ml (ref 0.10–4.00)

## 2012-09-15 NOTE — Telephone Encounter (Signed)
Message copied by Excell Seltzer on Tue Sep 15, 2012 12:29 AM ------      Message from: Alvina Chou      Created: Fri Aug 28, 2012 11:29 AM      Regarding: lab orders for Tuesday, 5.27.14       Patient is scheduled for CPX labs, please order future labs, Thanks , Terri       ------

## 2012-09-18 DIAGNOSIS — G4733 Obstructive sleep apnea (adult) (pediatric): Secondary | ICD-10-CM

## 2012-09-19 NOTE — Procedures (Signed)
Edward Frank, CHESNUT NO.:  [id number]  MEDICAL RECORD NO.:  [id number]          PATIENT TYPE:  OUT  LOCATION:  SLEEP CENTER                 FACILITY:  [facility]  PHYSICIAN:  Coralyn Helling, MD        DATE OF BIRTH:  03/23/1946  DATE OF STUDY:  09/11/2012                           NOCTURNAL POLYSOMNOGRAM  REFERRING PHYSICIAN:  Coralyn Helling, MD  INDICATION FOR STUDY:  Mr. Bulthuis is a 66 year old male who has a previous history of obstructive sleep apnea.  He had a sleep study from July 25, 2010, which showed an apnea/hypopnea index of 25 and oxygen saturation nadir of 70%.  He was previously tried on CPAP therapy.  I was unable to tolerate this.  He has since lost a significant amount of weight, and his sleep quality has improved.  He is referred back to Sleep Lab to further evaluate persistence of obstructive sleep apnea.  Height is 6 feet.  Weight is 215 pounds, BMI is 29, neck size is 16.5 inches.  EPWORTH SLEEPINESS SCORE:  3.  MEDICATIONS:  Reviewed in his medical record.  SLEEP ARCHITECTURE:  Total recording time was 368 minutes.  Total sleep time was 250 minutes, sleep efficiency was 68%.  Sleep latency was 31 minutes.  REM latency was 191 minutes.  The study was notable for lack of slow-wave sleep and slept in both the supine and nonsupine positions.  RESPIRATORY DATA:  The average rate was 16.  Mild to moderate snoring was noted by the technician.  The overall apnea/hypopnea index was 1.9. The respiratory disturbance index was 5.3.  There were 2 central apneic events.  The remainder events were obstructive in nature.  The REM apnea- hypopnea index was 14.6.  The non-REM apnea-hypopnea index was 0.8.  OXYGEN DATA:  The baseline oxygenation was 98%.  The oxygen saturation nadir was 88%.  The study was conducted without the use of supplemental oxygen.  The patient spent a total of 37.5 minutes with an oxygen saturation below 90% and 1 minute with an oxygen  saturation below 88%.  CARDIAC DATA:  The average heart rate was 65 and the rhythm strip showed sinus rhythm with sinus bradycardia and occasional PACs.  MOVEMENT-PARASOMNIA:  The periodic limb movement index was 14.9.  The patient had no restroom trips.  IMPRESSIONS-RECOMMENDATIONS:  This study showed evidence for very mild obstructive sleep apnea based on his respiratory disturbance index of 5.  He had an increase in his periodic limb movement index and clinical correlation will be necessary to determine the significance of this.     Coralyn Helling, MD Diplomat, American Board of Sleep Medicine    VS/MEDQ  D:  09/18/2012 16:19:26  T:  09/19/2012 01:15:16  Job:  [id number]

## 2012-09-22 ENCOUNTER — Ambulatory Visit (INDEPENDENT_AMBULATORY_CARE_PROVIDER_SITE_OTHER): Payer: Medicare Other | Admitting: Family Medicine

## 2012-09-22 ENCOUNTER — Telehealth: Payer: Self-pay | Admitting: Pulmonary Disease

## 2012-09-22 ENCOUNTER — Encounter: Payer: Self-pay | Admitting: Family Medicine

## 2012-09-22 VITALS — BP 130/90 | HR 59 | Temp 98.2°F | Ht 72.0 in | Wt 222.5 lb

## 2012-09-22 DIAGNOSIS — I714 Abdominal aortic aneurysm, without rupture, unspecified: Secondary | ICD-10-CM

## 2012-09-22 DIAGNOSIS — I10 Essential (primary) hypertension: Secondary | ICD-10-CM

## 2012-09-22 DIAGNOSIS — Z0000 Encounter for general adult medical examination without abnormal findings: Secondary | ICD-10-CM

## 2012-09-22 DIAGNOSIS — G1221 Amyotrophic lateral sclerosis: Secondary | ICD-10-CM

## 2012-09-22 DIAGNOSIS — R972 Elevated prostate specific antigen [PSA]: Secondary | ICD-10-CM | POA: Insufficient documentation

## 2012-09-22 DIAGNOSIS — E7800 Pure hypercholesterolemia, unspecified: Secondary | ICD-10-CM

## 2012-09-22 MED ORDER — PRAVASTATIN SODIUM 20 MG PO TABS: 20.0000 mg | ORAL_TABLET | Freq: Every day | ORAL | Status: DC

## 2012-09-22 NOTE — Assessment & Plan Note (Signed)
Well controlled. Continue current medication.  

## 2012-09-22 NOTE — Patient Instructions (Addendum)
Return for labs for repeat PSA free and total in next 3 months. Consider setting up HCPOA, and setting up a living will.

## 2012-09-22 NOTE — Progress Notes (Signed)
67 year old male presents for medicare wellness.  I have personally reviewed the Medicare Annual Wellness questionnaire and have noted  1. [name] medical and social history  2. Their use of alcohol, tobacco or illicit drugs  3. Their current medications and supplements  4. [name] functional ability including ADL's, fall risks, home safety risks and hearing or visual  impairment.  5. Diet and physical activities  6. Evidence for depression or mood disorders  The patients weight, height, BMI and visual acuity have been recorded in the chart  I have made referrals, counseling and provided education to the patient based review of the above and I have provided the pt with a written personalized care plan for preventive services.   Followed by cardiologist for hx of neurogenic syncope. No further episodes.  Recent diagnosis of AAA, 29 mm per pt. [facility] finding at [facility]  Sleep apnea, followed by Dr. Craige Cotta. Has lost weight so recent sleep study showed mild sleep apnea.  He did not tolerate CPAP. Wt Readings from Last 3 Encounters:  09/22/12 222 lb 8 oz (100.925 kg)  09/11/12 215 lb (97.523 kg)  08/25/12 224 lb 6.4 oz (101.787 kg)     PLS: Continue slow progressione change in follow up few months ago at [facility].  Pt doing fairly well.  Fell and injured right shoulder. Falling 1-2 times a month. Tolerate left rotator cuff surgery well in 2/.2014.  Hypertension: Well controlled on losartan.  RBBB, bradycardia Followed by [facility], Dr. Shirlee Latch.  Using medication without problems or lightheadedness:  Chest pain with exertion: No  Edema:stable  Short of breath: stable... [facility]  Average home BPs: 122/70 at [place], but normal at home.  Other issues:    Elevated Cholesterol: On pravachol 20. Now on fish oil... LDL at goal!, Tri at goal.  Lab Results  Component Value Date   CHOL 147 09/15/2012   HDL 42.30 09/15/2012   LDLCALC 87 09/15/2012   LDLDIRECT 125.8 10/26/2009   TRIG 88.0  09/15/2012   CHOLHDL 3 09/15/2012  Using medications without problems:None  Muscle aches: None  Diet compliance: Great control, low carb... Has lost 20 lbs intentionally at 4  Exercise:Limited by ALS. Walking to [employer].  Other complaints:   Prediabetes improved from last check.  .   Review of Systems  Constitutional: Negative for fever, fatigue and unexpected weight change.  HENT: Negative for ear pain, congestion, sore throat, rhinorrhea, trouble swallowing and postnasal drip.  Eyes: Negative for pain.  Respiratory: Negative for cough, shortness of breath and wheezing.  Cardiovascular: Negative for chest pain, palpitations and leg swelling.  Gastrointestinal: Negative for nausea, abdominal pain, diarrhea, constipation and blood in stool.  Genitourinary: Negative for dysuria, some urinary  Urgency, no nocturia, hematuria, discharge, penile swelling, scrotal swelling, difficulty urinating, penile pain and testicular pain.  Skin: Negative for rash.  Neurological: Negative for syncope, weakness, light-headedness, numbness and headaches.  Psychiatric/Behavioral: Negative for behavioral problems and dysphoric mood. The patient is not nervous/anxious.  Objective:   Physical Exam  Constitutional: He appears well-developed and well-nourished. Non-toxic appearance. He does not appear ill. No distress.  HENT:  Head: Normocephalic and atraumatic.  Right Ear: Hearing, tympanic membrane, external ear and ear canal normal.  Left Ear: Hearing, tympanic membrane, external ear and ear canal normal.  Nose: Nose normal.  Mouth/Throat: Uvula is midline, oropharynx is clear and moist and mucous membranes are normal.  Eyes: Conjunctivae, EOM and lids are normal. Pupils are equal, round, and reactive to  light. No foreign bodies found.  Neck: Trachea normal, normal range of motion and phonation normal. Neck supple. Carotid bruit is not present. No mass and no thyromegaly present.  Cardiovascular: Normal  rate, regular rhythm, S1 normal, S2 normal, intact distal pulses and normal pulses. Exam reveals no gallop.  No murmur heard.  Pulmonary/Chest: Breath sounds normal. He has no wheezes. He has no rhonchi. He has no rales.  Abdominal: Soft. Normal appearance and bowel sounds are normal. There is no hepatosplenomegaly. There is no tenderness. There is no rebound, no guarding and no CVA tenderness. No hernia.  Genitourinary: . Rectal exam shows no external hemorrhoid, no internal hemorrhoid, no fissure, no mass, no tenderness and anal tone normal. Guaiac negative stool. Prosate is  2 plusenlarged and not tender.  Musculoskeletal:  Lumbar back: He exhibits decreased range of motion and tenderness. He exhibits no bony tenderness.  Lymphadenopathy:  He has no cervical adenopathy.  Neurological: He is alert. He has normal reflexes. He displays atrophy. No cranial nerve deficit or sensory deficit. He exhibits abnormal muscle tone. Gait normal.  Chronic changes from PLS  Skin: Skin is warm, dry and intact. No rash noted.  Psychiatric: He has a normal mood and affect. His speech is normal and behavior is normal. Judgment normal.  Assessment & Plan:   [name] preventative maintenance and recommended screening tests for an annual wellness exam were reviewed in full today.  Brought up to date unless services declined.  Counselled on the importance of diet, exercise, and its role in overall health and mortality.  [name] FH and SH was reviewed, including their home life, tobacco status, and drug and alcohol status.   Vaccines: uptodate with TD, PNA, shingles  Prostate:  Father with prostate cancer age 63s. Lab Results  Component Value Date   PSA 3.51 09/15/2012   PSA 1.66 02/25/2011   PSA 2.32 08/21/2010  Colon: 07/2009, repeat in 10 years.

## 2012-09-22 NOTE — Telephone Encounter (Signed)
PSG 09/11/12 >> AHI 2, RDI 5, SpO2 88%  Left message explaining that he has borderline sleep apnea based on RDI (but not AHI) criteria, and that he likely does not need additional therapy for sleep apnea at present.  Advised him to call back to discuss in more detail.

## 2012-09-22 NOTE — Assessment & Plan Note (Signed)
Stable

## 2012-09-22 NOTE — Assessment & Plan Note (Addendum)
PSA trending up. Prostate exam reveals diffuse enlargement, no nodules.   Recheck PSA with free and total in 3 onths.

## 2012-09-22 NOTE — Addendum Note (Signed)
Addended by: Consuello Masse on: 09/22/2012 11:08 AM   Modules accepted: Orders

## 2012-12-23 ENCOUNTER — Other Ambulatory Visit (INDEPENDENT_AMBULATORY_CARE_PROVIDER_SITE_OTHER): Payer: Medicare Other

## 2012-12-23 DIAGNOSIS — R972 Elevated prostate specific antigen [PSA]: Secondary | ICD-10-CM

## 2012-12-24 LAB — PSA, TOTAL AND FREE
PSA, Free Pct: 26 % (ref >25)
PSA, Free: 0.91 ng/mL
PSA: 3.52 ng/mL (ref <=4.00)

## 2012-12-28 ENCOUNTER — Encounter: Payer: Self-pay | Admitting: *Deleted

## 2013-07-12 ENCOUNTER — Other Ambulatory Visit: Payer: Self-pay | Admitting: *Deleted

## 2013-07-12 DIAGNOSIS — I10 Essential (primary) hypertension: Secondary | ICD-10-CM

## 2013-07-12 MED ORDER — LOSARTAN POTASSIUM 50 MG PO TABS: 50.0000 mg | ORAL_TABLET | Freq: Every day | ORAL | Status: DC

## 2013-07-22 ENCOUNTER — Encounter: Payer: Self-pay | Admitting: *Deleted

## 2013-07-28 ENCOUNTER — Encounter: Payer: Self-pay | Admitting: Cardiology

## 2013-07-28 ENCOUNTER — Ambulatory Visit (INDEPENDENT_AMBULATORY_CARE_PROVIDER_SITE_OTHER): Payer: Medicare Other | Admitting: Cardiology

## 2013-07-28 VITALS — BP 141/94 | HR 54 | Ht 72.0 in | Wt 227.0 lb

## 2013-07-28 DIAGNOSIS — I498 Other specified cardiac arrhythmias: Secondary | ICD-10-CM

## 2013-07-28 DIAGNOSIS — R55 Syncope and collapse: Secondary | ICD-10-CM

## 2013-07-28 DIAGNOSIS — E785 Hyperlipidemia, unspecified: Secondary | ICD-10-CM

## 2013-07-28 DIAGNOSIS — I10 Essential (primary) hypertension: Secondary | ICD-10-CM

## 2013-07-28 DIAGNOSIS — E7800 Pure hypercholesterolemia, unspecified: Secondary | ICD-10-CM

## 2013-07-28 LAB — LIPID PANEL
CHOL/HDL RATIO: 4
Cholesterol: 166 mg/dL (ref 0–200)
HDL: 45.8 mg/dL (ref >39.00)
LDL Cholesterol: 98 mg/dL (ref 0–99)
Triglycerides: 111 mg/dL (ref 0.0–149.0)
VLDL: 22.2 mg/dL (ref 0.0–40.0)

## 2013-07-28 LAB — BASIC METABOLIC PANEL
BUN: 17 mg/dL (ref 6–23)
CHLORIDE: 104 meq/L (ref 96–112)
CO2: 26 mEq/L (ref 19–32)
Calcium: 9.6 mg/dL (ref 8.4–10.5)
Creatinine, Ser: 1 mg/dL (ref 0.4–1.5)
GFR: 79.53 mL/min (ref >60.00)
Glucose, Bld: 100 mg/dL — ABNORMAL HIGH (ref 70–99)
Potassium: 4.2 mEq/L (ref 3.5–5.1)
Sodium: 139 mEq/L (ref 135–145)

## 2013-07-28 MED ORDER — LOSARTAN POTASSIUM 50 MG PO TABS: 50.0000 mg | ORAL_TABLET | Freq: Every day | ORAL | Status: DC

## 2013-07-28 NOTE — Patient Instructions (Signed)
Your physician recommends that you return for lab work today--Lipid profile/BMET.   Your physician wants you to follow-up in: 1 year with Dr Aundra Dubin. (April 2016). You will receive a reminder letter in the mail two months in advance. If you don't receive a letter, please call our office to schedule the follow-up appointment.

## 2013-07-28 NOTE — Progress Notes (Signed)
Patient ID: Edward Frank, male   DOB: Oct 03, 1945, 68 y.o.   MRN: [id number] PCP: Dr. Diona Browner  68 yo with history of primary lateral sclerosis with muscle weakness, syncope/presyncope, and HTN presents for cardiology followup. I have seen him in the past because of episodes of neurocardiogenic syncope with micturation and cough.  He has had no recent syncopal episodes.  BP has been well-controlled (mildly high today but did not take losartan yet). No chest pain.  He walks about 1/2 mile daily without exertional dyspnea.  He was able to deer hunt this winter without problems.  His PLS has been slowly progressive.  He was retested for OSA and it was found to be borderline so is not on CPAP.     ECG: NSR, incomplete RBBB  Labs (11/12): LDL 65, HDL 48, K 4.4, creatinine 1.2 Labs (5/13): creatinine 1.0, LDL 68, HDL 41 Labs (5/14): LDL 87, HDL 42, K 4.3, creatinine 1.1  Past Medical History:  1. HYPERCHOLESTEROLEMIA  2. HTN: suspected ACEI cough.  3. BRADYCARDIA: Mild sinus bradycardia  4. Syncope/presyncope: Suspect neurocardiogenic. Situation syncope/presyncope with micturation and cough.  5. PLS: Goes once a year to the [place] in [place]. Motor weakness.  6. GERD  7. Fatty liver.  8. PFTs (11/11) were normal.  9. Echo (2/12): EF 55-60%, no regional wall motion abnormalities, mild RV dilation with low normal systolic function. No TR doppler jet so unable to estimate PA systolic pressure.  10. OSA: Borderline.    Family History:  Family History of Arthritis  Family History Diabetes 2nd degree relative  Family History High cholesterol  Family History Hypertension  Family History of Prostate CA 1st degree relative <50  Family History of Cardiovascular disorder  Dad: prostate cancer, HTN, malignant, renal failure, TIA  Mom: arrythmia.Marland Kitchenatrial fibrillation? Got PCM.  2 sisters with pacemakers   Social History:  Retired, former Animal nutritionist  Married, lives in [place]  Never Smoked   Alcohol use-one beverage every 2 weeks.  Drug use-no  Regular exercise-yes..walking to mail box  Diet: fruits and veggies, water  Agent Orange exposure in [place]    Current Outpatient Prescriptions  Medication Sig Dispense Refill  . aspirin EC 81 MG tablet Take 81 mg by mouth daily.      . Coenzyme Q10 (COQ10) 400 MG CAPS Take 400 mg by mouth daily.      . Cranberry 500 MG CAPS Take 500 mg by mouth 2 (two) times daily.       . indomethacin (INDOCIN) 25 MG capsule Take by mouth daily.       [name] losartan (COZAAR) 50 MG tablet Take 1 tablet (50 mg total) by mouth daily.  90 tablet  3  . methocarbamol (ROBAXIN) 750 MG tablet Take 1,500 mg by mouth at bedtime.       . Multiple Vitamin (MULTIVITAMIN WITH MINERALS) TABS Take 1 tablet by mouth daily.      . Omega-3 Fatty Acids (FISH OIL TRIPLE STRENGTH) 1400 MG CAPS Take 2,800 mg by mouth 2 (two) times daily.       [name] omeprazole (PRILOSEC) 20 MG capsule Take 20 mg by mouth daily.       [name] OVER THE COUNTER MEDICATION Take 2,500 mcg by mouth daily with supper. Vitamin B12 2538mcg      . OVER THE COUNTER MEDICATION Take 1 tablet by mouth daily with supper. Calcium citrate+D 650/500      . pravastatin (PRAVACHOL) 20 MG tablet Take 1 tablet (20 mg total)  by mouth at bedtime.  90 tablet  3  . RESVERATROL 100 MG CAPS Take 100 mg by mouth daily.      [name] tiZANidine (ZANAFLEX) 4 MG tablet Take 4 mg by mouth at bedtime as needed (for muscle spasms).      . traMADol (ULTRAM) 50 MG tablet Take 50 mg by mouth 2 (two) times daily as needed for pain.        No current facility-administered medications for this visit.    BP 141/94  Pulse 54  Ht 6' (1.829 m)  Wt 102.967 kg (227 lb)  BMI 30.78 kg/m2 General: NAD, overweight.  Neck: No JVD, no thyromegaly or thyroid nodule.  Lungs: Clear to auscultation bilaterally with normal respiratory effort. CV: Nondisplaced PMI.  Heart regular S1/S2, no S3/S4, no murmur.  No peripheral edema.  No carotid bruit.  Normal  pedal pulses.  Abdomen: Soft, nontender, no hepatosplenomegaly, no distention.  Neurologic: Alert and oriented x 3.  Psych: Normal affect. Extremities: No clubbing or cyanosis.   Assessment/Plan: 1. History of syncope: Suspect neurocardiogenic.  No recent episodes.  2. HTN: BP is controlled on current regimen.  3. Hyperlipidemia: Will check lipids.   Larey Dresser 07/28/2013

## 2013-09-16 ENCOUNTER — Telehealth: Payer: Self-pay | Admitting: Family Medicine

## 2013-09-16 DIAGNOSIS — E7800 Pure hypercholesterolemia, unspecified: Secondary | ICD-10-CM

## 2013-09-16 DIAGNOSIS — R972 Elevated prostate specific antigen [PSA]: Secondary | ICD-10-CM

## 2013-09-16 NOTE — Telephone Encounter (Signed)
Message copied by [name] on Thu Sep 16, 2013 12:16 AM ------      Message from: Marchia Bond      Created: Wed Sep 15, 2013  1:46 PM      Regarding: cpx labs 09/20/13       Please order  future cpx labs for pt's upcoming lab appt.      Thanks      Tasha       ------

## 2013-09-17 ENCOUNTER — Other Ambulatory Visit: Payer: Medicare Other

## 2013-09-20 ENCOUNTER — Other Ambulatory Visit (INDEPENDENT_AMBULATORY_CARE_PROVIDER_SITE_OTHER): Payer: Medicare Other

## 2013-09-20 DIAGNOSIS — R972 Elevated prostate specific antigen [PSA]: Secondary | ICD-10-CM

## 2013-09-20 DIAGNOSIS — E7800 Pure hypercholesterolemia, unspecified: Secondary | ICD-10-CM

## 2013-09-20 LAB — COMPREHENSIVE METABOLIC PANEL
ALT: 23 U/L (ref 0–53)
AST: 24 U/L (ref 0–37)
Albumin: 4.1 g/dL (ref 3.5–5.2)
Alkaline Phosphatase: 49 U/L (ref 39–117)
BUN: 18 mg/dL (ref 6–23)
CALCIUM: 9.7 mg/dL (ref 8.4–10.5)
CO2: 25 mEq/L (ref 19–32)
CREATININE: 1.1 mg/dL (ref 0.4–1.5)
Chloride: 108 mEq/L (ref 96–112)
GFR: 71.14 mL/min (ref >60.00)
Glucose, Bld: 95 mg/dL (ref 70–99)
Potassium: 4.7 mEq/L (ref 3.5–5.1)
Sodium: 141 mEq/L (ref 135–145)
Total Bilirubin: 0.5 mg/dL (ref 0.2–1.2)
Total Protein: 7 g/dL (ref 6.0–8.3)

## 2013-09-20 LAB — LIPID PANEL
CHOLESTEROL: 156 mg/dL (ref 0–200)
HDL: 41.1 mg/dL (ref >39.00)
LDL CALC: 90 mg/dL (ref 0–99)
TRIGLYCERIDES: 125 mg/dL (ref 0.0–149.0)
Total CHOL/HDL Ratio: 4
VLDL: 25 mg/dL (ref 0.0–40.0)

## 2013-09-21 LAB — PSA, TOTAL AND FREE
PSA, Free Pct: 28 % (ref >25)
PSA, Free: 0.57 ng/mL
PSA: 2.03 ng/mL (ref <=4.00)

## 2013-09-24 ENCOUNTER — Ambulatory Visit (INDEPENDENT_AMBULATORY_CARE_PROVIDER_SITE_OTHER): Payer: Medicare Other | Admitting: Family Medicine

## 2013-09-24 ENCOUNTER — Encounter: Payer: Self-pay | Admitting: Family Medicine

## 2013-09-24 VITALS — BP 116/72 | HR 66 | Temp 98.0°F | Ht 70.55 in | Wt 223.2 lb

## 2013-09-24 DIAGNOSIS — E7800 Pure hypercholesterolemia, unspecified: Secondary | ICD-10-CM

## 2013-09-24 DIAGNOSIS — I10 Essential (primary) hypertension: Secondary | ICD-10-CM

## 2013-09-24 DIAGNOSIS — Z0000 Encounter for general adult medical examination without abnormal findings: Secondary | ICD-10-CM

## 2013-09-24 DIAGNOSIS — G1221 Amyotrophic lateral sclerosis: Secondary | ICD-10-CM

## 2013-09-24 MED ORDER — PRAVASTATIN SODIUM 20 MG PO TABS: 20.0000 mg | ORAL_TABLET | Freq: Every day | ORAL | Status: DC

## 2013-09-24 NOTE — Patient Instructions (Signed)
Keep working on healthy eating and exercise as tolerated.

## 2013-09-24 NOTE — Progress Notes (Signed)
68 year old male presents for medicare wellness.  I have personally reviewed the Medicare Annual Wellness questionnaire and have noted  1. [name] medical and social history  2. Their use of alcohol, tobacco or illicit drugs  3. Their current medications and supplements  4. [name] functional ability including ADL's, fall risks, home safety risks and hearing or visual  impairment.  5. Diet and physical activities  6. Evidence for depression or mood disorders  The patients weight, height, BMI and visual acuity have been recorded in the chart  I have made referrals, counseling and provided education to the patient based review of the above and I have provided the pt with a written personalized care plan for preventive services.   Followed by cardiologist for hx of neurogenic syncope. No further episodes.  Diagnosis of AAA, 29 mm per pt. [facility] at [facility]   Sleep apnea, followed by Dr. Halford Chessman.  Has lost weight so recent sleep study showed mild sleep apnea. He did not tolerate CPAP.   Wt Readings from Last 3 Encounters:  09/24/13 223 lb 4 oz (101.266 kg)  07/28/13 227 lb (102.967 kg)  09/22/12 222 lb 8 oz (100.925 kg)    PLS/upper motor dominant AS: Continue slow progressione change in follow up few months ago at [facility].  Pt doing fairly well.  Fell and injured right shoulder.  Falling 1-2 times a month.  Tolerate left rotator cuff surgery well in 2/.2014.   Hypertension: Well controlled on losartan.  BP Readings from Last 3 Encounters:  09/24/13 116/72  07/28/13 141/94  09/22/12 130/90  RBBB, bradycardia Followed by [facility], Dr. Aundra Dubin.  Using medication without problems or lightheadedness: None Chest pain with exertion: No  Edema:stable  Short of breath: stable...  Average home BPs: not checking Other issues:   Elevated Cholesterol: On pravachol 20. Now on fish oil... LDL at goal!, Tri at goal.  Lab Results  Component Value Date   CHOL 156 09/20/2013   HDL  41.10 09/20/2013   LDLCALC 90 09/20/2013   LDLDIRECT 125.8 10/26/2009   TRIG 125.0 09/20/2013   CHOLHDL 4 09/20/2013  Using medications without problems:None  Muscle aches: None  Diet compliance: Great control, low carb.. Exercise:Limited by ALS. Walking to [employer].  Other complaints:   Prediabetes improved from last check.  .  Review of Systems  Constitutional: Negative for fever, fatigue and unexpected weight change.  HENT: Negative for ear pain, congestion, sore throat, rhinorrhea, trouble swallowing and postnasal drip.  Eyes: Negative for pain.  Respiratory: Negative for cough, shortness of breath and wheezing.  Cardiovascular: Negative for chest pain, palpitations and leg swelling.  Gastrointestinal: Negative for nausea, abdominal pain, diarrhea, constipation and blood in stool.  Genitourinary: Negative for dysuria, some urinary Urgency, no nocturia, hematuria, discharge, penile swelling, scrotal swelling, difficulty urinating, penile pain and testicular pain.  Skin: Negative for rash.  Neurological: Negative for syncope, weakness, light-headedness, numbness and headaches.  Psychiatric/Behavioral: Negative for behavioral problems and dysphoric mood. The patient is not nervous/anxious.  Objective:   Physical Exam  Constitutional: He appears well-developed and well-nourished. Non-toxic appearance. He does not appear ill. No distress.  HENT:  Head: Normocephalic and atraumatic.  Right Ear: Hearing, tympanic membrane, external ear and ear canal normal.  Left Ear: Hearing, tympanic membrane, external ear and ear canal normal.  Nose: Nose normal.  Mouth/Throat: Uvula is midline, oropharynx is clear and moist and mucous membranes are normal.  Eyes: Conjunctivae, EOM and lids are normal. Pupils are  equal, round, and reactive to light. No foreign bodies found.  Neck: Trachea normal, normal range of motion and phonation normal. Neck supple. Carotid bruit is not present. No mass and no thyromegaly  present.  Cardiovascular: Normal rate, regular rhythm, S1 normal, S2 normal, intact distal pulses and normal pulses. Exam reveals no gallop.  No murmur heard.  Pulmonary/Chest: Breath sounds normal. He has no wheezes. He has no rhonchi. He has no rales.  Abdominal: Soft. Normal appearance and bowel sounds are normal. There is no hepatosplenomegaly. There is no tenderness. There is no rebound, no guarding and no CVA tenderness. No hernia.  Genitourinary: . Rectal exam shows no external hemorrhoid, no internal hemorrhoid, no fissure, no mass, no tenderness and anal tone normal. Guaiac negative stool. Prosate is 2 plusenlarged and not tender.  Musculoskeletal:  Lumbar back: He exhibits decreased range of motion and tenderness. He exhibits no bony tenderness.  Lymphadenopathy:  He has no cervical adenopathy.  Neurological: He is alert. He has normal reflexes. He displays atrophy. No cranial nerve deficit or sensory deficit. He exhibits abnormal muscle tone. Gait normal.  Chronic changes from PLS  Skin: Skin is warm, dry and intact. No rash noted.  Psychiatric: He has a normal mood and affect. His speech is normal and behavior is normal. Judgment normal.  Assessment & Plan:   [name] preventative maintenance and recommended screening tests for an annual wellness exam were reviewed in full today.  Brought up to date unless services declined.  Counselled on the importance of diet, exercise, and its role in overall health and mortality.  [name] FH and SH was reviewed, including their home life, tobacco status, and drug and alcohol status.   Vaccines: uptodate with TD, Pneumvax and prevnar and  shingles  Prostate: Father with prostate cancer age 27s.  PSA fluctuating, but lower this year. Lab Results  Component Value Date   PSA 2.03 09/20/2013   PSA 3.52 12/23/2012   PSA 3.51 09/15/2012  Colon: 07/2009,  Sister last year diagnosed with colon cancer. Has repeat scheduled 6/23/ at [facility].

## 2013-09-24 NOTE — Progress Notes (Signed)
Pre visit review using our clinic review tool, if applicable. No additional management support is needed unless otherwise documented below in the visit note. 

## 2013-09-27 ENCOUNTER — Telehealth: Payer: Self-pay | Admitting: Family Medicine

## 2013-09-27 NOTE — Telephone Encounter (Signed)
Relevant patient education mailed to patient.  

## 2013-11-01 ENCOUNTER — Encounter: Payer: Self-pay | Admitting: Family Medicine

## 2013-11-01 ENCOUNTER — Ambulatory Visit (INDEPENDENT_AMBULATORY_CARE_PROVIDER_SITE_OTHER): Payer: Medicare Other | Admitting: Family Medicine

## 2013-11-01 VITALS — BP 124/68 | HR 88 | Temp 99.3°F | Wt 223.5 lb

## 2013-11-01 DIAGNOSIS — J209 Acute bronchitis, unspecified: Secondary | ICD-10-CM

## 2013-11-01 MED ORDER — AZITHROMYCIN 250 MG PO TABS: ORAL_TABLET | ORAL | Status: DC

## 2013-11-01 MED ORDER — HYDROCOD POLST-CHLORPHEN POLST 10-8 MG/5ML PO LQCR: 5.0000 mL | Freq: Every evening | ORAL | Status: DC | PRN

## 2013-11-01 NOTE — Progress Notes (Signed)
BP 124/68  Pulse 88  Temp(Src) 99.3 F (37.4 C) (Oral)  Wt 223 lb 8 oz (101.379 kg)  SpO2 95%   CC: sinusitis?  Subjective:    Patient ID: Edward Frank, male    DOB: [birth year], 68 y.o.   MRN: [id number]  HPI: Edward Frank is a 68 y.o. male presenting on 11/01/2013 for Sinusitis   1d h/o malaise and fatigue.  Actually ongoing sxs for the past few weeks, but yesterday and today acutely worse with pain at frontal sinuses when coughing.  Cough productive of yellow phlegm. + fevers and chills initially, now better. + PNdrainage. + coughing fits with trouble catching breath.  So far has tried left over cheratussin which didn't help.  No abd pain, nausea, ear or tooth pain.  Wife and daughter and son sick recently. No smokers at home. No h/o asthma.  Relevant past medical, surgical, family and social history reviewed and updated as indicated.  Allergies and medications reviewed and updated. Current Outpatient Prescriptions on File Prior to Visit  Medication Sig  . aspirin EC 81 MG tablet Take 81 mg by mouth daily.  . Coenzyme Q10 (COQ10) 400 MG CAPS Take 400 mg by mouth daily.  [name] CRANBERRY PO Take 300 mg by mouth 2 (two) times daily.  . indomethacin (INDOCIN) 25 MG capsule Take 25 mg by mouth daily.   [name] losartan (COZAAR) 50 MG tablet Take 1 tablet (50 mg total) by mouth daily.  . methocarbamol (ROBAXIN) 750 MG tablet Take 750 mg by mouth at bedtime.   . Multiple Vitamin (MULTIVITAMIN WITH MINERALS) TABS Take 1 tablet by mouth daily.  . Omega-3 Fatty Acids (FISH OIL TRIPLE STRENGTH) 1400 MG CAPS Take 2,800 mg by mouth 2 (two) times daily.   [name] omeprazole (PRILOSEC) 20 MG capsule Take 20 mg by mouth daily.   [name] OVER THE COUNTER MEDICATION Take 2,500 mcg by mouth daily with supper. Vitamin B12 2589mcg  . OVER THE COUNTER MEDICATION Take 1 tablet by mouth daily with supper. Calcium citrate+D3 500-800 mg  . pravastatin (PRAVACHOL) 20 MG tablet Take 1 tablet (20 mg total) by  mouth at bedtime.  [name] RESVERATROL 100 MG CAPS Take 300 mg by mouth daily.   [name] tiZANidine (ZANAFLEX) 4 MG tablet Take 4 mg by mouth at bedtime.   . traMADol (ULTRAM) 50 MG tablet Take 50 mg by mouth 2 (two) times daily as needed for pain.    No current facility-administered medications on file prior to visit.    Review of Systems Per HPI unless specifically indicated above    Objective:    BP 124/68  Pulse 88  Temp(Src) 99.3 F (37.4 C) (Oral)  Wt 223 lb 8 oz (101.379 kg)  SpO2 95%  Physical Exam  Nursing note and vitals reviewed. Constitutional: He appears well-developed and well-nourished. No distress.  HENT:  Head: Normocephalic and atraumatic.  Right Ear: Hearing, tympanic membrane, external ear and ear canal normal.  Left Ear: Hearing, tympanic membrane, external ear and ear canal normal.  Nose: Mucosal edema present. No rhinorrhea. Right sinus exhibits no maxillary sinus tenderness and no frontal sinus tenderness. Left sinus exhibits no maxillary sinus tenderness and no frontal sinus tenderness.  Mouth/Throat: Uvula is midline, oropharynx is clear and moist and mucous membranes are normal. No oropharyngeal exudate, posterior oropharyngeal edema, posterior oropharyngeal erythema or tonsillar abscesses.  Eyes: Conjunctivae and EOM are normal. Pupils are equal, round, and reactive to light. No scleral icterus.  Neck: Normal  range of motion. Neck supple.  Cardiovascular: Normal rate, regular rhythm, normal heart sounds and intact distal pulses.   No murmur heard. Pulmonary/Chest: Effort normal and breath sounds normal. No respiratory distress. He has no wheezes. He has no rales.  Lymphadenopathy:    He has no cervical adenopathy.  Skin: Skin is warm and dry. No rash noted.       Assessment & Plan:   Problem List Items Addressed This Visit   Acute bronchitis - Primary     Anticipate acute worsening of bronchitis vs developing sinusitis. Treat with zpack. Push fluids and  rest. tussionex for cough - has had good success with this in past. Update if not improving as expected or any worsening.        Follow up plan: Return if symptoms worsen or fail to improve, for follow up visit.

## 2013-11-01 NOTE — Progress Notes (Signed)
Pre visit review using our clinic review tool, if applicable. No additional management support is needed unless otherwise documented below in the visit note. 

## 2013-11-01 NOTE — Patient Instructions (Signed)
I am suspicious for bronchitis. Treat with zpack and tussionex for cough. Push fluids and rest. Update [place] if not improving as expected or worsening productive cough. May use ibuprofen for headache/frontal pressure. Good to see you today, call us with questions.

## 2013-11-01 NOTE — Assessment & Plan Note (Signed)
Anticipate acute worsening of bronchitis vs developing sinusitis. Treat with zpack. Push fluids and rest. tussionex for cough - has had good success with this in past. Update if not improving as expected or any worsening.

## 2014-01-12 DIAGNOSIS — M5137 Other intervertebral disc degeneration, lumbosacral region: Secondary | ICD-10-CM | POA: Insufficient documentation

## 2014-01-12 DIAGNOSIS — M47816 Spondylosis without myelopathy or radiculopathy, lumbar region: Secondary | ICD-10-CM | POA: Insufficient documentation

## 2014-01-12 DIAGNOSIS — G1221 Amyotrophic lateral sclerosis: Secondary | ICD-10-CM

## 2014-01-12 DIAGNOSIS — M51379 Other intervertebral disc degeneration, lumbosacral region without mention of lumbar back pain or lower extremity pain: Secondary | ICD-10-CM | POA: Insufficient documentation

## 2014-01-12 DIAGNOSIS — G1223 Primary lateral sclerosis: Secondary | ICD-10-CM | POA: Insufficient documentation

## 2014-01-12 DIAGNOSIS — M545 Low back pain, unspecified: Secondary | ICD-10-CM | POA: Insufficient documentation

## 2014-03-02 ENCOUNTER — Telehealth: Payer: Self-pay

## 2014-03-02 DIAGNOSIS — N489 Disorder of penis, unspecified: Secondary | ICD-10-CM

## 2014-03-02 NOTE — Telephone Encounter (Signed)
Mrs Cohenour left v/m requesting urology referral. Called pt or wife back to get additional info but left v/m requesting cb.

## 2014-03-02 NOTE — Telephone Encounter (Signed)
Mrs Foglio said pt has sore or cracked area that will not heal on prepuce; tried monistat, neosporin which was not effective. Area is very sore; no drainage noted. Pt has had for couple of months.  Pt does not have frequency of urine. Pt does not want to schedule appt at [facility]; he prefers to see urologist.Please advise.

## 2014-03-02 NOTE — Telephone Encounter (Signed)
Referral sent 

## 2014-03-07 NOTE — Telephone Encounter (Signed)
Edward Frank request cb about referral

## 2014-08-10 ENCOUNTER — Ambulatory Visit: Payer: Medicare Other | Admitting: Cardiology

## 2014-08-18 ENCOUNTER — Telehealth: Payer: Self-pay | Admitting: *Deleted

## 2014-08-18 ENCOUNTER — Ambulatory Visit (INDEPENDENT_AMBULATORY_CARE_PROVIDER_SITE_OTHER): Payer: Medicare Other | Admitting: Family Medicine

## 2014-08-18 ENCOUNTER — Encounter: Payer: Self-pay | Admitting: Family Medicine

## 2014-08-18 VITALS — BP 98/60 | HR 76 | Temp 99.2°F | Ht 70.5 in | Wt 233.0 lb

## 2014-08-18 DIAGNOSIS — J209 Acute bronchitis, unspecified: Secondary | ICD-10-CM

## 2014-08-18 DIAGNOSIS — G1221 Amyotrophic lateral sclerosis: Secondary | ICD-10-CM

## 2014-08-18 MED ORDER — HYDROCODONE-HOMATROPINE 5-1.5 MG/5ML PO SYRP: ORAL_SOLUTION | ORAL | Status: DC

## 2014-08-18 MED ORDER — HYDROCOD POLST-CPM POLST ER 10-8 MG/5ML PO SUER: 5.0000 mL | Freq: Two times a day (BID) | ORAL | Status: DC | PRN

## 2014-08-18 MED ORDER — AZITHROMYCIN 250 MG PO TABS: ORAL_TABLET | ORAL | Status: DC

## 2014-08-18 NOTE — Telephone Encounter (Signed)
Yolanda at [facility] notified to fill both cough medications per Dr. Lorelei Pont.  Left message with Mr. Lukasik to return my call so I could clarify directions on these medications.

## 2014-08-18 NOTE — Progress Notes (Signed)
Pre visit review using our clinic review tool, if applicable. No additional management support is needed unless otherwise documented below in the visit note. 

## 2014-08-18 NOTE — Telephone Encounter (Signed)
I had given him hycodan to take during the day and tussionex at night.  Hycodan is less sedating, so preferable during the day.  I have no concerns about this patient. 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 No drug abuse concerns. From my perspective, it is ok to fill them both.

## 2014-08-18 NOTE — Telephone Encounter (Signed)
Spoke with Edward Frank and clarified that the Hycodan cough syrup should be taken during the day and the Tussinex is to be taken at bedtime.  Directions were written wrong on the prescriptions.  Edward Frank states understanding on how cough syrups are to be taken.

## 2014-08-18 NOTE — Progress Notes (Signed)
Dr. Frederico Hamman T. Alyssandra Hulsebus, MD, [facility] [place], [address] Phone: [phone] Fax: [phone]  08/18/2014  Patient: Edward Frank, MRN: [id number], DOB: 11-Mar-1946, 69 y.o.  Primary Physician:  Eliezer Lofts, MD  Chief Complaint: Cough and Fever  Subjective:   Edward Frank is a 69 y.o. very pleasant male patient who presents with the following:  ALS, bronchitis. 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 He was coughing most of the night last night. He actually had cataract surgery 2 days ago. The patient does have ALS. He does not have a history of smoking tobacco.  Had cataract surgery 2 days ago.  Hot, clammy, sweats. Chills.   Coughing a lot, too.  Woke up coughing a lot.  Never a smoker.   Past Medical History, Surgical History, Social History, Family History, Problem List, Medications, and Allergies have been reviewed and updated if relevant.  ROS: GEN: Acute illness details above GI: Tolerating PO intake GU: maintaining adequate hydration and urination Pulm: No SOB Interactive and getting along well at home.  Otherwise, ROS is as per the HPI.   Objective:   BP 98/60 mmHg  Pulse 76  Temp(Src) 99.2 F (37.3 C) (Oral)  Ht 5' 10.5" (1.791 m)  Wt 233 lb (105.688 kg)  BMI 32.95 kg/m2  SpO2 94%   GEN: A and O x 3. WDWN. NAD.    ENT: Nose clear, ext NML.  No LAD.  No JVD.  TM's clear. Oropharynx clear.  PULM: Normal WOB, no distress. No crackles, wheezes, rhonchi. CV: RRR, no M/G/R, No rubs, No JVD.   EXT: warm and well-perfused, No c/c/e. PSYCH: Pleasant and conversant.    Laboratory and Imaging Data:  Assessment and Plan:   Acute bronchitis, unspecified organism  ALS  Acute bronchitis: discussed plan of care. Given length of symptoms and overall history, will treat with ABX in this case. Continue with additional  supportive care, cough medications, liquids, sleep, steam / vaporizer.   Follow-up: No Follow-up on file.  New Prescriptions   AZITHROMYCIN (ZITHROMAX) 250 MG TABLET    2 tabs po on day 1, then 1 tab po for 4 days   CHLORPHENIRAMINE-HYDROCODONE (TUSSIONEX PENNKINETIC ER) 10-8 MG/5ML SUER    Take 5 mLs by mouth every 12 (twelve) hours as needed for cough.   HYDROCODONE-HOMATROPINE (HYCODAN) 5-1.5 MG/5ML SYRUP    1 tsp po at night before bed prn cough   No orders of the defined types were placed in this encounter.    Signed,  Maud Deed. Allycia Pitz, MD   Patient's Medications  New Prescriptions   AZITHROMYCIN (ZITHROMAX) 250 MG TABLET    2 tabs po on day 1, then 1 tab po for 4 days   CHLORPHENIRAMINE-HYDROCODONE (TUSSIONEX PENNKINETIC ER) 10-8 MG/5ML SUER    Take 5 mLs by mouth every 12 (twelve) hours as needed for cough.   HYDROCODONE-HOMATROPINE (HYCODAN) 5-1.5 MG/5ML SYRUP    1 tsp po at night before bed prn cough  Previous Medications   ASPIRIN EC 81 MG TABLET    Take 81 mg by mouth daily.   COENZYME Q10 (COQ10) 400 MG CAPS    Take 400 mg by mouth daily.   CRANBERRY PO    Take 300 mg by mouth 2 (two) times daily.   INDOMETHACIN (INDOCIN) 25 MG CAPSULE    Take 25 mg by mouth daily.  LOSARTAN (COZAAR) 50 MG TABLET    Take 1 tablet (50 mg total) by mouth daily.   METHOCARBAMOL (ROBAXIN) 750 MG TABLET    Take 750 mg by mouth at bedtime.    MULTIPLE VITAMIN (MULTIVITAMIN WITH MINERALS) TABS    Take 1 tablet by mouth daily.   OMEGA-3 FATTY ACIDS (FISH OIL TRIPLE STRENGTH) 1400 MG CAPS    Take 2,800 mg by mouth 2 (two) times daily.    OMEPRAZOLE (PRILOSEC) 20 MG CAPSULE    Take 20 mg by mouth daily.    OVER THE COUNTER MEDICATION    Take 2,500 mcg by mouth daily with supper. Vitamin B12 252mcg   OVER THE COUNTER MEDICATION    Take 1 tablet by mouth daily with supper. Calcium citrate+D3 500-800 mg   PRAVASTATIN (PRAVACHOL) 20 MG TABLET    Take 1 tablet (20 mg total) by mouth at bedtime.    RESVERATROL 100 MG CAPS    Take 300 mg by mouth daily.    TIZANIDINE (ZANAFLEX) 4 MG TABLET    Take 4 mg by mouth at bedtime.    TRAMADOL (ULTRAM) 50 MG TABLET    Take 50 mg by mouth 2 (two) times daily as needed for pain.   Modified Medications   No medications on file  Discontinued Medications   AZITHROMYCIN (ZITHROMAX Z-PAK) 250 MG TABLET    Two on day 1 followed by one daily for 4 days for total of 5 days, PO   CHLORPHENIRAMINE-HYDROCODONE (TUSSIONEX) 10-8 MG/5ML LQCR    Take 5 mLs by mouth at bedtime as needed for cough (sedation precautions). Sedation precautions

## 2014-08-18 NOTE — Telephone Encounter (Signed)
Edward Frank at [facility] in [place] calling to verify orders that were sent in today.  Both Hycodan and Tussionex were prescribed.  Okay to fill both?

## 2014-08-22 ENCOUNTER — Other Ambulatory Visit: Payer: Self-pay | Admitting: *Deleted

## 2014-08-22 MED ORDER — LOSARTAN POTASSIUM 50 MG PO TABS: 50.0000 mg | ORAL_TABLET | Freq: Every day | ORAL | Status: DC

## 2014-09-26 ENCOUNTER — Other Ambulatory Visit: Payer: Medicare Other

## 2014-09-30 ENCOUNTER — Encounter: Payer: Medicare Other | Admitting: Family Medicine

## 2014-10-27 ENCOUNTER — Encounter: Payer: Self-pay | Admitting: Cardiology

## 2014-10-27 ENCOUNTER — Ambulatory Visit (INDEPENDENT_AMBULATORY_CARE_PROVIDER_SITE_OTHER): Payer: Medicare Other | Admitting: Cardiology

## 2014-10-27 VITALS — BP 124/64 | HR 74 | Ht 70.5 in | Wt 231.0 lb

## 2014-10-27 DIAGNOSIS — I4510 Unspecified right bundle-branch block: Secondary | ICD-10-CM

## 2014-10-27 DIAGNOSIS — R55 Syncope and collapse: Secondary | ICD-10-CM

## 2014-10-27 DIAGNOSIS — I10 Essential (primary) hypertension: Secondary | ICD-10-CM | POA: Diagnosis not present

## 2014-10-27 MED ORDER — PRAVASTATIN SODIUM 20 MG PO TABS: 20.0000 mg | ORAL_TABLET | Freq: Every day | ORAL | Status: DC

## 2014-10-27 MED ORDER — LOSARTAN POTASSIUM 50 MG PO TABS: 50.0000 mg | ORAL_TABLET | Freq: Every day | ORAL | Status: DC

## 2014-10-27 NOTE — Patient Instructions (Addendum)
Medication Instructions:  Your physician recommends that you continue on your current medications as directed. Please refer to the Current Medication list given to you today.   Labwork:   Testing/Procedures:   Follow-Up:   Your physician wants you to follow-up in:  [place] will receive a reminder letter in the mail two months in advance. If you don't receive a letter, please call our office to schedule the follow-up appointment.   NEEDS TO WORK IN WIFE [place] OF 01/16/15 PER [place] OKAY TO DOUBLE BOOK    Any Other Special Instructions Will Be Listed Below (If Applicable).

## 2014-10-28 NOTE — Progress Notes (Signed)
Patient ID: Edward Frank, male   DOB: June 04, 1945, 69 y.o.   MRN: [id number] PCP: Dr. 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 I have seen him in the past because of episodes of neurocardiogenic syncope with micturation and cough.  He has had no recent syncopal episodes.  BP has been well-controlled. No chest pain.  ALS has been slowly progressive, now it is hard for him to write.  He tries to stay as active as possible.  Denies exertional dyspnea but fatigues easily.      Labs (11/12): LDL 65, HDL 48, K 4.4, creatinine 1.2 Labs (5/13): creatinine 1.0, LDL 68, HDL 41 Labs (5/14): LDL 87, HDL 42, K 4.3, creatinine 1.1 Labs (6/15): LDL 90, HDL 41 Labs (4/16): K 4.3, creatinine 0.84  Past Medical History:  1. HYPERCHOLESTEROLEMIA  2. HTN: suspected ACEI cough.  3. BRADYCARDIA: Mild sinus bradycardia  4. Syncope/presyncope: Suspect neurocardiogenic. Situation syncope/presyncope with micturation and cough.  5. Upper motor neuron-predominant ALS: Goes once a year to the [employer] in [place]. Motor weakness.  6. GERD  7. Fatty liver.  8. PFTs (11/11) were normal.  9. Echo (2/12): EF 55-60%, no regional wall motion abnormalities, mild RV dilation with low normal systolic function. No TR doppler jet so unable to estimate PA systolic pressure.  10. OSA: Borderline.    Family History:  Family History of Arthritis  Family History Diabetes 2nd degree relative  Family History High cholesterol  Family History Hypertension  Family History of Prostate CA 1st degree relative <50  Family History of Cardiovascular disorder  Dad: prostate cancer, HTN, malignant, renal failure, TIA  Mom: arrythmia.Marland Kitchenatrial fibrillation? Got PCM.  2 sisters with pacemakers   Social History:  Retired, former Animal nutritionist  Married, lives in [place]  Never Smoked  Alcohol use-one beverage every 2 weeks.  Drug  use-no  Regular exercise-yes..walking to mail box  Diet: fruits and veggies, water  Agent Orange exposure in [place]    Current Outpatient Prescriptions  Medication Sig Dispense Refill  . aspirin EC 81 MG tablet Take 81 mg by mouth daily.    [name] azithromycin (ZITHROMAX) 250 MG tablet 2 tabs po on day 1, then 1 tab po for 4 days 6 each 0  . chlorpheniramine-HYDROcodone (TUSSIONEX PENNKINETIC ER) 10-8 MG/5ML SUER Take 5 mLs by mouth every 12 (twelve) hours as needed for cough. 140 mL 0  . Coenzyme Q10 (COQ10) 400 MG CAPS Take 400 mg by mouth daily.    [name] CRANBERRY PO Take 300 mg by mouth 2 (two) times daily.    [name] HYDROcodone-homatropine (HYCODAN) 5-1.5 MG/5ML syrup 1 tsp po at night before bed prn cough 180 mL 0  . indomethacin (INDOCIN) 25 MG capsule Take 25 mg by mouth daily.     [name] losartan (COZAAR) 50 MG tablet Take 1 tablet (50 mg total) by mouth daily. 90 tablet 3  . methocarbamol (ROBAXIN) 750 MG tablet Take 750 mg by mouth at bedtime.     . Multiple Vitamin (MULTIVITAMIN WITH MINERALS) TABS Take 1 tablet by mouth daily.    . Omega-3 Fatty Acids (FISH OIL TRIPLE STRENGTH) 1400 MG CAPS Take 2,800 mg by mouth 2 (two) times daily.     [name] omeprazole (PRILOSEC) 20 MG capsule Take 20 mg by mouth daily.     [name] OVER THE COUNTER MEDICATION Take 2,500 mcg by mouth daily with supper. Vitamin B12 2512mcg    .  OVER THE COUNTER MEDICATION Take 1 tablet by mouth daily with supper. Calcium citrate+D3 500-800 mg    . pravastatin (PRAVACHOL) 20 MG tablet Take 1 tablet (20 mg total) by mouth at bedtime. 90 tablet 3  . RESVERATROL 100 MG CAPS Take 300 mg by mouth daily.     [name] tiZANidine (ZANAFLEX) 4 MG tablet Take 4 mg by mouth at bedtime.     . traMADol (ULTRAM) 50 MG tablet Take 50 mg by mouth 2 (two) times daily as needed for pain.      No current facility-administered medications for this visit.    BP 124/64 mmHg  Pulse 74  Ht 5' 10.5" (1.791 m)  Wt 231 lb (104.781 kg)  BMI 32.67 kg/m2  SpO2  93% General: NAD, overweight.  Neck: No JVD, no thyromegaly or thyroid nodule.  Lungs: Clear to auscultation bilaterally with normal respiratory effort. CV: Nondisplaced PMI.  Heart regular S1/S2, no S3/S4, no murmur.  No peripheral edema.  No carotid bruit.  Normal pedal pulses.  Abdomen: Soft, nontender, no hepatosplenomegaly, no distention.  Neurologic: Alert and oriented x 3.  Psych: Normal affect. Extremities: No clubbing or cyanosis.   Assessment/Plan: 1. History of syncope: Suspect neurocardiogenic.  No recent episodes.  2. HTN: BP is controlled on current regimen.  3. Hyperlipidemia: Has physical with PCP soon, will get lipids done then.   [name] 10/28/2014

## 2014-11-03 ENCOUNTER — Other Ambulatory Visit: Payer: Self-pay | Admitting: *Deleted

## 2014-11-08 ENCOUNTER — Other Ambulatory Visit: Payer: Self-pay | Admitting: *Deleted

## 2014-11-08 ENCOUNTER — Telehealth: Payer: Self-pay | Admitting: Family Medicine

## 2014-11-08 DIAGNOSIS — Z125 Encounter for screening for malignant neoplasm of prostate: Secondary | ICD-10-CM

## 2014-11-08 DIAGNOSIS — E7800 Pure hypercholesterolemia, unspecified: Secondary | ICD-10-CM

## 2014-11-08 NOTE — Telephone Encounter (Signed)
-----   Message from Ellamae Sia sent at 11/02/2014  3:08 PM EDT ----- Regarding: Lab orders for Thursday, 7.21.16 Patient is scheduled for CPX labs, please order future labs, Thanks , Karna Christmas

## 2014-11-10 ENCOUNTER — Other Ambulatory Visit (INDEPENDENT_AMBULATORY_CARE_PROVIDER_SITE_OTHER): Payer: Medicare Other

## 2014-11-10 DIAGNOSIS — E7800 Pure hypercholesterolemia, unspecified: Secondary | ICD-10-CM

## 2014-11-10 DIAGNOSIS — Z125 Encounter for screening for malignant neoplasm of prostate: Secondary | ICD-10-CM

## 2014-11-10 LAB — LIPID PANEL
CHOL/HDL RATIO: 3
CHOLESTEROL: 149 mg/dL (ref 0–200)
HDL: 44.4 mg/dL (ref >39.00)
LDL Cholesterol: 83 mg/dL (ref 0–99)
NonHDL: 104.6
TRIGLYCERIDES: 106 mg/dL (ref 0.0–149.0)
VLDL: 21.2 mg/dL (ref 0.0–40.0)

## 2014-11-10 LAB — COMPREHENSIVE METABOLIC PANEL
ALK PHOS: 55 U/L (ref 39–117)
ALT: 30 U/L (ref 0–53)
AST: 28 U/L (ref 0–37)
Albumin: 4.5 g/dL (ref 3.5–5.2)
BUN: 20 mg/dL (ref 6–23)
CHLORIDE: 107 meq/L (ref 96–112)
CO2: 26 mEq/L (ref 19–32)
CREATININE: 1.15 mg/dL (ref 0.40–1.50)
Calcium: 9.6 mg/dL (ref 8.4–10.5)
GFR: 66.65 mL/min (ref >60.00)
GLUCOSE: 96 mg/dL (ref 70–99)
Potassium: 4.5 mEq/L (ref 3.5–5.1)
Sodium: 141 mEq/L (ref 135–145)
Total Bilirubin: 0.6 mg/dL (ref 0.2–1.2)
Total Protein: 6.6 g/dL (ref 6.0–8.3)

## 2014-11-10 LAB — PSA, MEDICARE: PSA: 1.76 ng/ml (ref 0.10–4.00)

## 2014-11-17 ENCOUNTER — Encounter: Payer: Self-pay | Admitting: Family Medicine

## 2014-11-17 ENCOUNTER — Ambulatory Visit (INDEPENDENT_AMBULATORY_CARE_PROVIDER_SITE_OTHER): Payer: Medicare Other | Admitting: Family Medicine

## 2014-11-17 VITALS — BP 128/80 | HR 67 | Temp 98.4°F | Ht 71.0 in | Wt 235.5 lb

## 2014-11-17 DIAGNOSIS — Z0000 Encounter for general adult medical examination without abnormal findings: Secondary | ICD-10-CM

## 2014-11-17 DIAGNOSIS — Z7189 Other specified counseling: Secondary | ICD-10-CM

## 2014-11-17 DIAGNOSIS — I10 Essential (primary) hypertension: Secondary | ICD-10-CM

## 2014-11-17 DIAGNOSIS — E7800 Pure hypercholesterolemia, unspecified: Secondary | ICD-10-CM

## 2014-11-17 NOTE — Progress Notes (Signed)
69 year old male presents for medicare wellness.   PLS/upper motor dominant AS: Continue slow progression change in follow up few months ago at [facility].  On riluzole to slow progression. Pt doing fairly well.  Falling 1-2 times a month.  Moves slowly to avoid. Tolerate left rotator cuff surgery well in 05/2012.   Followed by cardiologist for hx of neurogenic syncope. No further episodes.  Diagnosis of AAA, 29 mm per pt. Incidental finding at [facility]  Last OV 10/27/2014  Sleep apnea, followed by Dr. Halford Chessman.  Has lost weight so recent sleep study showed mild sleep apnea. He did not tolerate CPAP.   Hypertension: Well controlled on losartan.  BP Readings from Last 3 Encounters:  11/17/14 128/80  10/27/14 124/64  08/18/14 98/60   RBBB, bradycardia Followed by [facility], Dr. Aundra Dubin.  Using medication without problems or lightheadedness: None Chest pain with exertion: No  Edema:stable  Short of breath: stable...  Average home BPs: not checking Other issues:   Elevated Cholesterol: On pravachol 20. Now on fish oil... LDL at goal!, Tri at goal.  Lab Results  Component Value Date   CHOL 149 11/10/2014   HDL 44.40 11/10/2014   LDLCALC 83 11/10/2014   LDLDIRECT 125.8 10/26/2009   TRIG 106.0 11/10/2014   CHOLHDL 3 11/10/2014   Using medications without problems:None  Muscle aches: None  Diet compliance: Great control, low carb.. Exercise:Limited by ALS. Walking to [employer].  Other complaints:   Prediabetes improved from last check.  .  Review of Systems  Constitutional: Negative for fever, fatigue and unexpected weight change.  HENT: Negative for ear pain, congestion, sore throat, rhinorrhea, trouble swallowing and postnasal drip.  Eyes: Negative for pain.  Respiratory: Negative for cough, shortness of breath and wheezing.  Cardiovascular: Negative for chest pain, palpitations and leg swelling.  Gastrointestinal: Negative for nausea, abdominal pain, diarrhea,  constipation and blood in stool.  Genitourinary: Negative for dysuria, some urinary Urgency, no nocturia, hematuria, discharge, penile swelling, scrotal swelling, difficulty urinating, penile pain and testicular pain.  Skin: Negative for rash.  Neurological: Negative for syncope, weakness, light-headedness, numbness and headaches.  Psychiatric/Behavioral: Negative for behavioral problems and dysphoric mood. The patient is not nervous/anxious.  Objective:   Physical Exam  Constitutional: He appears well-developed and well-nourished. Non-toxic appearance. He does not appear ill. No distress.  HENT:  Head: Normocephalic and atraumatic.  Right Ear: Hearing, tympanic membrane, external ear and ear canal normal.  Left Ear: Hearing, tympanic membrane, external ear and ear canal normal.  Nose: Nose normal.  Mouth/Throat: Uvula is midline, oropharynx is clear and moist and mucous membranes are normal.  Eyes: Conjunctivae, EOM and lids are normal. Pupils are equal, round, and reactive to light. No foreign bodies found.  Neck: Trachea normal, normal range of motion and phonation normal. Neck supple. Carotid bruit is not present. No mass and no thyromegaly present.  Cardiovascular: Normal rate, regular rhythm, S1 normal, S2 normal, intact distal pulses and normal pulses. Exam reveals no gallop.  No murmur heard.  Pulmonary/Chest: Breath sounds normal. He has no wheezes. He has no rhonchi. He has no rales.  Abdominal: Soft. Normal appearance and bowel sounds are normal. There is no hepatosplenomegaly. There is no tenderness. There is no rebound, no guarding and no CVA tenderness. No hernia.  Genitourinary: . Rectal exam shows no external hemorrhoid, no internal hemorrhoid, no fissure, no mass, no tenderness and anal tone normal. Guaiac negative stool. Prostate is 2 plusenlarged and not tender.  Musculoskeletal:  Lumbar  back: He exhibits decreased range of motion and tenderness. He exhibits  no bony tenderness.  Lymphadenopathy:  He has no cervical adenopathy.  Neurological: He is alert. He has normal reflexes. He displays atrophy. No cranial nerve deficit or sensory deficit. He exhibits abnormal muscle tone. Gait normal.  Chronic changes from PLS  Skin: Skin is warm, dry and intact. No rash noted.  Psychiatric: He has a normal mood and affect. His speech is normal and behavior is normal. Judgment normal.  Assessment & Plan:   [name] preventative maintenance and recommended screening tests for an annual wellness exam were reviewed in full today.  Brought up to date unless services declined.  Counselled on the importance of diet, exercise, and its role in overall health and mortality.  [name] FH and SH was reviewed, including their home life, tobacco status, and drug and alcohol status.   Vaccines: uptodate with TD, Pneumvax and prevnar and shingles. Prostate: Father with prostate cancer age 52s.  Discussed in detail, have chose to check PSA and rectal exam yearly. Lab Results  Component Value Date   PSA 1.76 11/10/2014   PSA 2.03 09/20/2013   PSA 3.52 12/23/2012   Colon: Sister with colon cancer. Had repeat scheduled 10/12/13 at [facility], repeat in 5 years.

## 2014-11-17 NOTE — Assessment & Plan Note (Signed)
Well controlled. Continue current medication.  

## 2014-11-17 NOTE — Progress Notes (Signed)
Pre visit review using our clinic review tool, if applicable. No additional management support is needed unless otherwise documented below in the visit note. 

## 2014-11-17 NOTE — Patient Instructions (Signed)
Continue working on healthy eating and weight management.

## 2015-06-15 ENCOUNTER — Ambulatory Visit: Payer: Medicare Other | Admitting: Family Medicine

## 2015-07-10 ENCOUNTER — Emergency Department (HOSPITAL_COMMUNITY): Payer: Medicare Other

## 2015-07-10 ENCOUNTER — Emergency Department (HOSPITAL_COMMUNITY)
Admission: EM | Admit: 2015-07-10 | Discharge: 2015-07-10 | Disposition: A | Discharge status: Home / Self Care | Payer: Medicare Other | Attending: Emergency Medicine | Admitting: Emergency Medicine

## 2015-07-10 ENCOUNTER — Encounter (HOSPITAL_COMMUNITY): Payer: Self-pay | Admitting: Emergency Medicine

## 2015-07-10 DIAGNOSIS — Y998 Other external cause status: Secondary | ICD-10-CM | POA: Diagnosis not present

## 2015-07-10 DIAGNOSIS — E785 Hyperlipidemia, unspecified: Secondary | ICD-10-CM | POA: Insufficient documentation

## 2015-07-10 DIAGNOSIS — Y9289 Other specified places as the place of occurrence of the external cause: Secondary | ICD-10-CM | POA: Insufficient documentation

## 2015-07-10 DIAGNOSIS — S43014A Anterior dislocation of right humerus, initial encounter: Secondary | ICD-10-CM | POA: Diagnosis not present

## 2015-07-10 DIAGNOSIS — W01198A Fall on same level from slipping, tripping and stumbling with subsequent striking against other object, initial encounter: Secondary | ICD-10-CM | POA: Diagnosis not present

## 2015-07-10 DIAGNOSIS — K219 Gastro-esophageal reflux disease without esophagitis: Secondary | ICD-10-CM | POA: Diagnosis not present

## 2015-07-10 DIAGNOSIS — Z79899 Other long term (current) drug therapy: Secondary | ICD-10-CM | POA: Insufficient documentation

## 2015-07-10 DIAGNOSIS — S2231XA Fracture of one rib, right side, initial encounter for closed fracture: Secondary | ICD-10-CM | POA: Diagnosis not present

## 2015-07-10 DIAGNOSIS — Y9389 Activity, other specified: Secondary | ICD-10-CM | POA: Diagnosis not present

## 2015-07-10 DIAGNOSIS — Z880 Allergy status to penicillin: Secondary | ICD-10-CM | POA: Insufficient documentation

## 2015-07-10 DIAGNOSIS — I10 Essential (primary) hypertension: Secondary | ICD-10-CM | POA: Insufficient documentation

## 2015-07-10 DIAGNOSIS — S43034A Inferior dislocation of right humerus, initial encounter: Secondary | ICD-10-CM | POA: Insufficient documentation

## 2015-07-10 DIAGNOSIS — S29001A Unspecified injury of muscle and tendon of front wall of thorax, initial encounter: Secondary | ICD-10-CM | POA: Diagnosis present

## 2015-07-10 DIAGNOSIS — Z8669 Personal history of other diseases of the nervous system and sense organs: Secondary | ICD-10-CM | POA: Insufficient documentation

## 2015-07-10 DIAGNOSIS — Z7982 Long term (current) use of aspirin: Secondary | ICD-10-CM | POA: Diagnosis not present

## 2015-07-10 DIAGNOSIS — M1990 Unspecified osteoarthritis, unspecified site: Secondary | ICD-10-CM | POA: Diagnosis not present

## 2015-07-10 DIAGNOSIS — W19XXXA Unspecified fall, initial encounter: Secondary | ICD-10-CM

## 2015-07-10 MED ORDER — OXYCODONE-ACETAMINOPHEN 5-325 MG PO TABS: 1.0000 | ORAL_TABLET | ORAL | Status: DC | PRN

## 2015-07-10 MED ORDER — HYDROMORPHONE HCL 1 MG/ML IJ SOLN
1.0000 mg | Freq: Once | INTRAMUSCULAR | Status: AC
Start: 2015-07-10 — End: 2015-07-10
  Administered 2015-07-10: 1 mg via INTRAVENOUS
  Filled 2015-07-10: qty 1

## 2015-07-10 MED ORDER — ETOMIDATE 2 MG/ML IV SOLN
14.0000 mg | Freq: Once | INTRAVENOUS | Status: AC
Start: 2015-07-10 — End: 2015-07-10
  Administered 2015-07-10: 14 mg via INTRAVENOUS
  Filled 2015-07-10: qty 10

## 2015-07-10 NOTE — ED Notes (Signed)
Pt reports fall today , right shoulder injury, possible dislocation . Pt appears in distress. reports hx right shoulder surgery . Pt report head injury against concrete facing down  when falling today. No obvious injury to face . Hx ALS.

## 2015-07-10 NOTE — ED Provider Notes (Signed)
CSN: [id number]     Arrival date & time 07/10/15  1411 History   First MD Initiated Contact with Patient 07/10/15 1529     Chief Complaint  Patient presents with  . Fall  . Shoulder Injury   HPI   Edward Frank is a 70 y.o. male PMH significant for ALS, hyperlipidemia, hypertension, multiple falls, right bundle branch block presenting status post fall today. He does not remember the fall but states this happened 4 weeks ago and he injured his right wrist. Today, he fell onto his right shoulder possibly hitting his head. His wife, present at bedside, states that he had blood in his mouth after the fall. He denies sustaining a mechanical fall. He denies headache, chest pain, shortness of breath, abdominal pain, nausea, vomiting, loss of bowel or bladder control, anticoagulant use.  Past Medical History  Diagnosis Date  . Hyperlipidemia   . Hypertension   . Bradycardia   . GERD (gastroesophageal reflux disease)   . Fatty liver   . Syncope   . OSA (obstructive sleep apnea) 01/22/2011  . 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 Right bundle branch block   . Headache(784.0)   . Arthritis   . Fracture, tibia, with fibula teenage yrs.     no surgery, wore cast   Past Surgical History  Procedure Laterality Date  . Shoulder surgery  1977    Luxating   . Hernia repair  12-2001    inguinal hernia bilateral  . Foot surgery  11-2008    hammer toe and bunion  . Shoulder arthroscopy with rotator cuff repair and subacromial decompression Right 06/18/2012    Procedure: RIGHT SHOULDER ARTHROSCOPY WITH SUBACROMIAL DECOMPRESSION AND DISTAL CLAVICLE RESECTION AND ROTATOR CUFF REPAIR;  Surgeon: Marin Shutter, MD;  Location: [place];  Service: Orthopedics;  Laterality: Right;   Family History  Problem Relation Age of Onset  . Heart disease Mother     ? afib and got pacemaker  . Uterine cancer Mother   . Prostate cancer Father   . Hypertension Father   . Kidney failure Father   . Stroke Father   .  Colon cancer Sister 20   Social History  Substance Use Topics  . Smoking status: Never Smoker   . Smokeless tobacco: Never Used  . Alcohol Use: Yes     Comment: occasional    Review of Systems  Ten systems are reviewed and are negative for acute change except as noted in the HPI  Allergies  Penicillins  Home Medications   Prior to Admission medications   Medication Sig Start Date End Date Taking? Authorizing Provider  aspirin EC 81 MG tablet Take 81 mg by mouth daily.    Historical Provider, MD  Coenzyme Q10 (COQ10) 400 MG CAPS Take 400 mg by mouth daily.    Historical Provider, MD  CRANBERRY PO Take 300 mg by mouth 2 (two) times daily.    Historical Provider, MD  indomethacin (INDOCIN) 25 MG capsule Take 25 mg by mouth daily.     Historical Provider, MD  losartan (COZAAR) 50 MG tablet Take 1 tablet (50 mg total) by mouth daily. 10/27/14   Larey Dresser, MD  methocarbamol (ROBAXIN) 750 MG tablet Take 750 mg by mouth at bedtime.     Historical Provider, MD  Multiple Vitamin (MULTIVITAMIN WITH MINERALS) TABS Take 1 tablet by mouth daily.    Historical Provider, MD  Omega-3 Fatty Acids (FISH OIL TRIPLE STRENGTH) 1400 MG CAPS Take  2,800 mg by mouth 2 (two) times daily.     Historical Provider, MD  omeprazole (PRILOSEC) 20 MG capsule Take 20 mg by mouth daily.     Historical Provider, MD  OVER THE COUNTER MEDICATION Take 2,500 mcg by mouth daily with supper. Vitamin B12 2568mcg    Historical Provider, MD  OVER THE COUNTER MEDICATION Take 1 tablet by mouth daily with supper. Calcium citrate+D3 500-800 mg    Historical Provider, MD  pravastatin (PRAVACHOL) 20 MG tablet Take 1 tablet (20 mg total) by mouth at bedtime. 10/27/14   Larey Dresser, MD  RESVERATROL 100 MG CAPS Take 300 mg by mouth daily.     Historical Provider, MD  riluzole (RILUTEK) 50 MG tablet Take 1 tablet (50 mg total) by mouth every 12 (twelve) hours. 11/08/14   Larey Dresser, MD  tiZANidine (ZANAFLEX) 4 MG tablet Take 4  mg by mouth at bedtime as needed.     Historical Provider, MD  traMADol (ULTRAM) 50 MG tablet Take 50 mg by mouth 2 (two) times daily as needed for pain.     Historical Provider, MD   BP 149/90 mmHg  Pulse 83  Temp(Src) 97.5 F (36.4 C) (Oral)  Resp 16  SpO2 94% Physical Exam  Constitutional: He appears well-developed and well-nourished. No distress.  HENT:  Head: Normocephalic and atraumatic.  Mouth/Throat: Oropharynx is clear and moist. No oropharyngeal exudate.  Eyes: Conjunctivae are normal. Pupils are equal, round, and reactive to light. Right eye exhibits no discharge. Left eye exhibits no discharge. No scleral icterus.  Neck: Normal range of motion. No tracheal deviation present.  Cardiovascular: Normal rate, regular rhythm, normal heart sounds and intact distal pulses.  Exam reveals no gallop and no friction rub.   No murmur heard. Pulmonary/Chest: Effort normal and breath sounds normal. No respiratory distress. He has no wheezes. He has no rales. He exhibits tenderness.  Chest tenderness along right lateral ribs.  Abdominal: Soft. Bowel sounds are normal. He exhibits no distension and no mass. There is no tenderness. There is no rebound and no guarding.  Musculoskeletal: He exhibits no edema.  Right shoulder with obvious deformity and decreased range of motion due to pain. Neurovascularly intact bilaterally otherwise. Cervical, thoracic, lumbar spine without midline tenderness, step-off or deformity  Lymphadenopathy:    He has no cervical adenopathy.  Neurological: He is alert. Coordination normal.  Skin: Skin is warm and dry. No rash noted. He is not diaphoretic. No erythema.  Psychiatric: He has a normal mood and affect. His behavior is normal.  Nursing note and vitals reviewed.   ED Course  .Sedation Date/Time: 07/10/2015 6:15 PM Performed by: Milton Ferguson Authorized by: Milton Ferguson  Consent:    Consent given by:  Patient Indications:    Sedation purpose:   Dislocation reduction   Procedure necessitating sedation performed by:  Physician performing sedation   Intended level of sedation:  Moderate (conscious sedation) Pre-sedation assessment:    Pre-sedation assessments completed and reviewed: airway patency, cardiovascular function, mental status, nausea/vomiting, pain level, respiratory function and temperature   Procedure details (see MAR for exact dosages):    Sedation:  Etomidate Post-procedure details:    Post-sedation assessment completed:  07/10/2015 5:50 PM   Attendance: Constant attendance by certified staff until patient recovered     Recovery: Patient returned to pre-procedure baseline     Post-sedation assessments completed and reviewed: airway patency, cardiovascular function, mental status, nausea/vomiting, pain level and respiratory function  Patient is stable for discharge or admission: Yes     Patient tolerance:  Tolerated well, no immediate complications   Reduction of dislocation Date/Time: 6:12 PM Performed by: Dr. Sharmon Leyden Authorized by: Dr. Sharmon Leyden Consent: Verbal consent obtained. Risks and benefits: risks, benefits and alternatives were discussed Consent given by: patient Required items: required blood products, implants, devices, and special equipment available Time out: Immediately prior to procedure a "time out" was called to verify the correct patient, procedure, equipment, support staff and site/side marked as required.  Patient sedated: 14 mg etomidate  Vitals: Vital signs were monitored during sedation. Patient tolerance: Patient tolerated the procedure well with no immediate complications. Joint: Right shoulder Reduction technique: Traction  Labs Review Labs Reviewed - No data to display  Imaging Review Dg Ribs Unilateral W/chest Right  07/10/2015  CLINICAL DATA:  Right lower anterior rib pain secondary to a fall on concrete today. Acute right shoulder dislocation. EXAM: RIGHT RIBS AND CHEST - 3+  VIEW COMPARISON:  Chest x-ray dated 06/16/2012 FINDINGS: There is an acute fracture of the anterior lateral aspect of the right fifth rib. There are multiple old right anterior and posterior rib fractures. No pneumothorax or lung contusion. Anterior inferior dislocation of the right humeral head. Heart size and vascularity are normal. No infiltrates or effusions. IMPRESSION: Acute displaced fracture of the anterior lateral aspect of the right fifth rib. Anterior inferior dislocation of the right humeral head. Multiple old right rib fractures. Electronically Signed   By: Lorriane Shire M.D.   On: 07/10/2015 16:42   Dg Shoulder Right  07/10/2015  CLINICAL DATA:  Fall EXAM: RIGHT SHOULDER - 2+ VIEW COMPARISON:  None. FINDINGS: Anterior shoulder dislocation.  Probable Hill-Sachs deformity. Right rib fractures approximately the fourth and fifth ribs. No pneumothorax identified. No clavicle fracture. IMPRESSION: Anterior shoulder dislocation Right rib fractures, probably acute but incompletely evaluated. Electronically Signed   By: Franchot Gallo M.D.   On: 07/10/2015 15:24   Ct Head Wo Contrast  07/10/2015  CLINICAL DATA:  70 year old male with history of trauma from a fall today, with injury to the head against concrete. Patient reports striking chin during the wall. No associated loss of consciousness. EXAM: CT HEAD WITHOUT CONTRAST TECHNIQUE: Contiguous axial images were obtained from the base of the skull through the vertex without intravenous contrast. COMPARISON:  No priors. FINDINGS: No acute intracranial abnormalities. Specifically, no evidence of acute intracranial hemorrhage, no definite findings of acute/subacute cerebral ischemia, no mass, mass effect, hydrocephalus or abnormal intra or extra-axial fluid collections. Visualized paranasal sinuses and mastoids are well pneumatized. No acute displaced skull fractures are identified. IMPRESSION: *No acute intracranial abnormalities. *The appearance of the  brain is normal. Electronically Signed   By: Vinnie Langton M.D.   On: 07/10/2015 16:43   Dg Shoulder Right Port  07/10/2015  CLINICAL DATA:  Postreduction of right shoulder dislocation. EXAM: PORTABLE RIGHT SHOULDER - 2+ VIEW COMPARISON:  Earlier the same date. FINDINGS: 1729 hours. The glenohumeral dislocation has been reduced. There is a probable Hill-Sachs deformity of the humeral head. No displaced fracture fragments are identified. Old right-sided rib fractures again noted. IMPRESSION: Successful reduction of glenohumeral dislocation. Probable Hill-Sachs deformity. Electronically Signed   By: Richardean Sale M.D.   On: 07/10/2015 17:44   I have personally reviewed and evaluated these images and lab results as part of my medical decision-making.  MDM   Final diagnoses:  Fall, initial encounter  Anterior shoulder dislocation, right, initial encounter  Rib  fracture, right, closed, initial encounter   Patient uncomfortable appearing, vital signs stable. X-ray obtained prior to evaluation demonstrates an right anterior shoulder dislocation and right rib fractures that were incompletely evaluated. Dr. Roderic Palau approved 1 mg Dilaudid for patient prior to my evaluation. Discussed case with Dr. Roderic Palau who advised no further workup for cardiac or infectious etiologies, this is most likely result of ALS. Will image CT head and obtain dedicated right rib x-rays. There is an acute displaced fracture of the anterior lateral aspect of the right fifth rib on dedicated rib x-rays. CT head negative for acute change. Right shoulder was reduced and patient was sedated by Dr. Milton Ferguson.  His shoulder reduction films demonstrated successful reduction of glenohumeral dislocation as well as a probable Hill-Sachs deformity. Patient reevaluated and is stable for discharge. Discussed reasons for return. Patient to follow-up with [facility]. Patient in understanding and agreement with the plan.  [place] Lions, PA-C 07/10/15 Vernelle Emerald  Milton Ferguson, MD 07/12/15 2006

## 2015-07-10 NOTE — Sedation Documentation (Signed)
Dr.Zammit completed reduction of right shoulder with moderate sedation. X-RAY notified of  Post-reduction imaging of right shoulder.

## 2015-07-10 NOTE — Progress Notes (Signed)
CSW attempted to meet with patient at bedside. However, nurse is present. CSW will attempt to meet with patient again later.  Willette Brace [id number] ED CSW 07/10/2015 5:41 PM

## 2015-07-10 NOTE — ED Notes (Signed)
Education given on incentive spirometer to pt and spouse.

## 2015-07-10 NOTE — Sedation Documentation (Signed)
Dr. Roderic Palau has completed procedure and radiology order has been placed for follow up x-ray.

## 2015-07-10 NOTE — Discharge Instructions (Signed)
[name],  Nice meeting you! Please follow-up with Dr. Onnie Graham at [facility]. Return to the emergency department if you develop increased pain, numbness or tingling in your right hand, discolorations, extensive swelling in your arm or in your shoulder. Feel better soon!  S. Wendie Simmer, PA-C

## 2015-07-10 NOTE — Sedation Documentation (Signed)
Patient denies pain and is resting comfortably. Spouse at bedside.

## 2015-07-10 NOTE — ED Notes (Signed)
Spouse at bedside and discharge information discussed with both. No questions about discharge information provided.

## 2015-07-10 NOTE — Sedation Documentation (Signed)
PA at bedside.

## 2015-08-08 ENCOUNTER — Encounter: Payer: Self-pay | Admitting: Family Medicine

## 2015-08-08 ENCOUNTER — Ambulatory Visit (INDEPENDENT_AMBULATORY_CARE_PROVIDER_SITE_OTHER): Payer: Medicare Other | Admitting: Family Medicine

## 2015-08-08 VITALS — BP 130/74 | HR 64 | Temp 98.5°F | Ht 71.0 in | Wt 250.8 lb

## 2015-08-08 DIAGNOSIS — R609 Edema, unspecified: Secondary | ICD-10-CM | POA: Diagnosis not present

## 2015-08-08 DIAGNOSIS — R0609 Other forms of dyspnea: Secondary | ICD-10-CM | POA: Diagnosis not present

## 2015-08-08 LAB — CBC WITH DIFFERENTIAL/PLATELET
BASOS PCT: 0.7 % (ref 0.0–3.0)
Basophils Absolute: 0.1 10*3/uL (ref 0.0–0.1)
EOS PCT: 4.4 % (ref 0.0–5.0)
Eosinophils Absolute: 0.4 10*3/uL (ref 0.0–0.7)
HEMATOCRIT: 46.8 % (ref 39.0–52.0)
HEMOGLOBIN: 15.6 g/dL (ref 13.0–17.0)
Lymphocytes Relative: 25.7 % (ref 12.0–46.0)
Lymphs Abs: 2.1 10*3/uL (ref 0.7–4.0)
MCHC: 33.4 g/dL (ref 30.0–36.0)
MCV: 94.7 fl (ref 78.0–100.0)
Monocytes Absolute: 0.7 10*3/uL (ref 0.1–1.0)
Monocytes Relative: 9.1 % (ref 3.0–12.0)
Neutro Abs: 4.9 10*3/uL (ref 1.4–7.7)
Neutrophils Relative %: 60.1 % (ref 43.0–77.0)
Platelets: 211 10*3/uL (ref 150.0–400.0)
RBC: 4.94 Mil/uL (ref 4.22–5.81)
RDW: 15 % (ref 11.5–15.5)
WBC: 8.1 10*3/uL (ref 4.0–10.5)

## 2015-08-08 LAB — COMPREHENSIVE METABOLIC PANEL
ALBUMIN: 4.6 g/dL (ref 3.5–5.2)
ALT: 34 U/L (ref 0–53)
AST: 32 U/L (ref 0–37)
Alkaline Phosphatase: 70 U/L (ref 39–117)
BUN: 20 mg/dL (ref 6–23)
CHLORIDE: 106 meq/L (ref 96–112)
CO2: 28 mEq/L (ref 19–32)
CREATININE: 1.05 mg/dL (ref 0.40–1.50)
Calcium: 10 mg/dL (ref 8.4–10.5)
GFR: 73.88 mL/min (ref >60.00)
Glucose, Bld: 106 mg/dL — ABNORMAL HIGH (ref 70–99)
POTASSIUM: 4.5 meq/L (ref 3.5–5.1)
SODIUM: 140 meq/L (ref 135–145)
TOTAL PROTEIN: 7.3 g/dL (ref 6.0–8.3)
Total Bilirubin: 0.4 mg/dL (ref 0.2–1.2)

## 2015-08-08 LAB — T4, FREE: Free T4: 0.67 ng/dL (ref 0.60–1.60)

## 2015-08-08 LAB — T3, FREE: T3, Free: 3.5 pg/mL (ref 2.3–4.2)

## 2015-08-08 LAB — TSH: TSH: 1.78 u[IU]/mL (ref 0.35–4.50)

## 2015-08-08 NOTE — Assessment & Plan Note (Signed)
ECHO and lab eval.

## 2015-08-08 NOTE — Patient Instructions (Addendum)
Stop at lab on way out and at front desk to set up ECHO.

## 2015-08-08 NOTE — Assessment & Plan Note (Signed)
EKG stable.  Eval with ECHO.

## 2015-08-08 NOTE — Progress Notes (Signed)
   Subjective:    Patient ID: Edward Frank, male    DOB: 05-Dec-1945, 70 y.o.   MRN: [id number]  HPI   70 year old male presents with multiple complaints. Wife brings him in today   1.Pitting edema in last 3 months in bilateral  ankles. No chest pain with exertion other than pain with dep breath from right rib fracture in last month.   Stopped rilutek... Because thought could cause swelling, but not improvement in last week.  BP Readings from Last 3 Encounters:  08/08/15 130/74  07/10/15 122/74  11/17/14 128/80    Had ECHo in 2012. EF 55-60%, no regional wall motion abnormalities, mild RV dilation with low normal systolic function. No TR doppler jet so unable to estimate PA systolic pressure Sees Dr. Aundra Dubin neurocardiogenic syncope with micturation and cough. EKG last year, EKG stable incomplete RBBB  2. Has noted wheeze when gets up moving. More tired easily. Also ongoing x 3 months. Dyspnea on exertion with more walking.  Daily cough, dry. No fever.   3.RUQ pain x 3 months, mild constant 1-2 on pain scale, worse after eating.  No nausea and vomiting, no fever. Still has gallbladder.  Recently feel 05/2015 ans 06/2105.. Broke right wrist , separated shoulder as well as broken rib on right. Lost balance from his ALS.    Review of Systems  Constitutional: Positive for fatigue. Negative for fever.  HENT: Negative for ear pain and postnasal drip.   Eyes: Negative for pain.  Respiratory: Positive for cough, shortness of breath and wheezing.   Cardiovascular: Positive for leg swelling. Negative for chest pain and palpitations.  Gastrointestinal: Positive for abdominal pain.       Objective:   Physical Exam  Constitutional: Vital signs are normal. He appears well-developed and well-nourished.  HENT:  Head: Normocephalic.  Right Ear: Hearing normal.  Left Ear: Hearing normal.  Nose: Nose normal.  Mouth/Throat: Oropharynx is clear and moist and mucous membranes are normal.   Neck: Trachea normal. Carotid bruit is not present. No thyroid mass and no thyromegaly present.  Cardiovascular: Normal rate, regular rhythm and normal pulses.  Exam reveals no gallop, no distant heart sounds and no friction rub.   No murmur heard. Bilateral 2 plus peripheral edema  Pulmonary/Chest: Effort normal and breath sounds normal. No respiratory distress.  Skin: Skin is warm, dry and intact. No rash noted.  Psychiatric: He has a normal mood and affect. His speech is normal and behavior is normal. Thought content normal.          Assessment & Plan:

## 2015-08-08 NOTE — Progress Notes (Signed)
Pre visit review using our clinic review tool, if applicable. No additional management support is needed unless otherwise documented below in the visit note. 

## 2015-08-11 ENCOUNTER — Ambulatory Visit (HOSPITAL_COMMUNITY)
Admission: RE | Admit: 2015-08-11 | Discharge: 2015-08-11 | Disposition: A | Discharge status: Home / Self Care | Payer: Medicare Other | Source: Ambulatory Visit | Attending: Family Medicine | Admitting: Family Medicine

## 2015-08-11 DIAGNOSIS — I38 Endocarditis, valve unspecified: Secondary | ICD-10-CM | POA: Diagnosis not present

## 2015-08-11 DIAGNOSIS — I77810 Thoracic aortic ectasia: Secondary | ICD-10-CM | POA: Diagnosis not present

## 2015-08-11 DIAGNOSIS — I071 Rheumatic tricuspid insufficiency: Secondary | ICD-10-CM | POA: Insufficient documentation

## 2015-08-11 DIAGNOSIS — E785 Hyperlipidemia, unspecified: Secondary | ICD-10-CM | POA: Diagnosis not present

## 2015-08-11 DIAGNOSIS — I119 Hypertensive heart disease without heart failure: Secondary | ICD-10-CM | POA: Diagnosis not present

## 2015-08-11 DIAGNOSIS — R0609 Other forms of dyspnea: Secondary | ICD-10-CM | POA: Insufficient documentation

## 2015-08-11 DIAGNOSIS — I340 Nonrheumatic mitral (valve) insufficiency: Secondary | ICD-10-CM | POA: Insufficient documentation

## 2015-08-11 DIAGNOSIS — R0600 Dyspnea, unspecified: Secondary | ICD-10-CM | POA: Diagnosis present

## 2015-08-18 ENCOUNTER — Telehealth: Payer: Self-pay | Admitting: Family Medicine

## 2015-08-18 MED ORDER — CHLORTHALIDONE 50 MG PO TABS: 50.0000 mg | ORAL_TABLET | Freq: Every day | ORAL | Status: DC

## 2015-08-18 NOTE — Telephone Encounter (Signed)
Reviewed ECHO results with pt. Start chlorthalidone 50 mg daily for swelling. FOllow BP at home.  Move cardiology appt sooner if able, especially if DOE and swelling not improving. Keep follow up appt next week for re-eval BP, fluid status and BMET.

## 2015-08-24 ENCOUNTER — Ambulatory Visit (INDEPENDENT_AMBULATORY_CARE_PROVIDER_SITE_OTHER): Payer: Medicare Other | Admitting: Family Medicine

## 2015-08-24 ENCOUNTER — Encounter: Payer: Self-pay | Admitting: Family Medicine

## 2015-08-24 VITALS — BP 118/78 | HR 65 | Temp 98.4°F | Ht 71.0 in | Wt 246.5 lb

## 2015-08-24 DIAGNOSIS — I10 Essential (primary) hypertension: Secondary | ICD-10-CM

## 2015-08-24 DIAGNOSIS — R609 Edema, unspecified: Secondary | ICD-10-CM

## 2015-08-24 DIAGNOSIS — I517 Cardiomegaly: Secondary | ICD-10-CM | POA: Insufficient documentation

## 2015-08-24 DIAGNOSIS — R0609 Other forms of dyspnea: Secondary | ICD-10-CM | POA: Diagnosis not present

## 2015-08-24 NOTE — Assessment & Plan Note (Addendum)
Nml thyroid, liver and kidney function. Improved with initiation of chlorthalidone. Continue at current dose.  Exercise as able, weight loss, low salt  DASH diet. ECHO showed mild diastolic dysfunction and mild LVH.

## 2015-08-24 NOTE — Patient Instructions (Signed)
Continue chlorthalidone daily.  Follow blood pressure at home.  Keep appointment as scheduled with cardiology.. Call if symptoms worsening.  Try to exercise as able.

## 2015-08-24 NOTE — Progress Notes (Signed)
Pre visit review using our clinic review tool, if applicable. No additional management support is needed unless otherwise documented below in the visit note. 

## 2015-08-24 NOTE — Progress Notes (Signed)
   Subjective:    Patient ID: Edward Frank, male    DOB: [birth year], 70 y.o.   MRN: [id number]  HPI  70 year old male presents for 2 week follow up on DOE, wheeze, daily dry cough and peripheral edema.   ECHO showed: LVH mild to moderate Nml thyroid, nml cbc, nml CMET.   He has had 4 lb weight loss after starting on chlorthalidone 50 mg daily.  He has had improvement in swelling after only 3 doses. May have some improvement in shortness of breath with exertion.  He is still very fatigued easily with ALS. No current cough. His next appt with cardiology is in July. Per pt he did a pulmonary function test, in 04/2015.. Volume was still adequate. Wt Readings from Last 3 Encounters:  08/24/15 246 lb 8 oz (111.812 kg)  08/08/15 250 lb 12 oz (113.739 kg)  11/17/14 235 lb 8 oz (106.822 kg)  Has not been following BP at home.  BP Readings from Last 3 Encounters:  08/24/15 118/78  08/08/15 130/74  07/10/15 122/74   RUQ pain, not currently a problem.  Social History /Family History/Past Medical History reviewed and updated if needed.   Review of Systems  Constitutional: Negative for fever and fatigue.  HENT: Negative for ear pain.   Eyes: Negative for pain.  Respiratory: Positive for shortness of breath. Negative for cough and wheezing.   Cardiovascular: Positive for leg swelling. Negative for chest pain and palpitations.  Gastrointestinal: Negative for abdominal pain.  Genitourinary: Negative for dysuria.  Musculoskeletal: Negative for arthralgias.  Neurological: Negative for syncope, light-headedness and headaches.  Psychiatric/Behavioral: Negative for dysphoric mood.       Objective:   Physical Exam  Constitutional: Vital signs are normal. He appears well-developed and well-nourished.  HENT:  Head: Normocephalic.  Right Ear: Hearing normal.  Left Ear: Hearing normal.  Nose: Nose normal.  Mouth/Throat: Oropharynx is clear and moist and mucous membranes are normal.    Neck: Trachea normal. Carotid bruit is not present. No thyroid mass and no thyromegaly present.  Cardiovascular: Normal rate, regular rhythm and normal pulses.  Exam reveals no gallop, no distant heart sounds and no friction rub.   No murmur heard. No peripheral edema at this time, mild chronic venous stasis changes.  Pulmonary/Chest: Effort normal and breath sounds normal. No respiratory distress. He has no decreased breath sounds. He has no wheezes. He has no rhonchi. He has no rales.  Skin: Skin is warm, dry and intact. No rash noted.  Psychiatric: He has a normal mood and affect. His speech is normal and behavior is normal. Thought content normal.          Assessment & Plan:

## 2015-08-24 NOTE — Assessment & Plan Note (Signed)
Controlled on current medications 

## 2015-08-24 NOTE — Assessment & Plan Note (Signed)
After only 3 days of chlorthalidone and 4 lbs fluid reduction.. He has had some improvement.  No longer complaining of cough  Or wheeze.  His fatigue symptoms and possibly some respiratory symptoms are due to his ALS.  PFTs earlier in year showed good lung capacity and air movement. Doubt respiratory disease causing symptoms.

## 2015-08-24 NOTE — Assessment & Plan Note (Signed)
Noted on recent ECHO . BP control good.  Will ask cardiology to review to determine if any additional evaluation needed earlier than schedule annual cardiology appt in July.

## 2015-08-25 NOTE — Progress Notes (Signed)
Suspect he has some diastolic CHF.  If he feels better on the chlorthalidone, can wait until scheduled appt.  If he has ongoing dyspnea and/or chest discomfort, he can see me sooner.

## 2015-08-28 ENCOUNTER — Telehealth: Payer: Self-pay | Admitting: *Deleted

## 2015-08-28 NOTE — Telephone Encounter (Signed)
Mr. Blaustein notified as instructed by telephone.

## 2015-08-28 NOTE — Progress Notes (Signed)
Let pt and wife know what Dr. Aundra Dubin stated ( at end of my note)

## 2015-08-28 NOTE — Telephone Encounter (Signed)
-----   Message from [name], MD sent at 08/28/2015 11:46 AM EDT ----- For the note for Edward Frank where you could not see any comment... Just let [name] know that I sent a note to his cardiologist, Aundra Dubin, who said no reason to be seen earlier if chlorthalidone was helping. Of course, if shortness of breath and swelling not continuing to improve, let cards know and they will see him sooner.

## 2015-11-01 ENCOUNTER — Ambulatory Visit (INDEPENDENT_AMBULATORY_CARE_PROVIDER_SITE_OTHER): Payer: Medicare Other | Admitting: Cardiology

## 2015-11-01 ENCOUNTER — Encounter: Payer: Self-pay | Admitting: Cardiology

## 2015-11-01 VITALS — BP 124/82 | HR 88 | Ht 71.0 in | Wt 247.0 lb

## 2015-11-01 DIAGNOSIS — R0609 Other forms of dyspnea: Secondary | ICD-10-CM

## 2015-11-01 DIAGNOSIS — R609 Edema, unspecified: Secondary | ICD-10-CM | POA: Diagnosis not present

## 2015-11-01 MED ORDER — PRAVASTATIN SODIUM 20 MG PO TABS: 20.0000 mg | ORAL_TABLET | Freq: Every day | ORAL | Status: DC

## 2015-11-01 MED ORDER — LOSARTAN POTASSIUM 50 MG PO TABS: 50.0000 mg | ORAL_TABLET | Freq: Every day | ORAL | Status: DC

## 2015-11-01 NOTE — Patient Instructions (Signed)
Medication Instructions:  Your physician recommends that you continue on your current medications as directed. Please refer to the Current Medication list given to you today.   Labwork: none  Testing/Procedures: None   Follow-Up: Your physician wants you to follow-up in: 1 year with Dr Aundra Dubin . (July 2018). You will receive a reminder letter in the mail two months in advance. If you don't receive a letter, please call our office to schedule the follow-up appointment.        If you need a refill on your cardiac medications before your next appointment, please call your pharmacy.

## 2015-11-03 NOTE — Progress Notes (Signed)
Patient ID: Edward Frank, male   DOB: May 31, 1945, 70 y.o.   MRN: [id number] PCP: Dr. 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 I have seen him in the past because of episodes of neurocardiogenic syncope with micturation and cough.  He is occasionally lightheaded with standing but has had no recent syncopal episodes.  BP has been well-controlled. No chest pain.  ALS has been slowly progressive.  He tries to stay as active as possible.    This Spring, he was started on a new ALS medication and developed significant peripheral edema.  Chlorthalidone was started and titrated to 50 mg daily.  He stopped the medication, and edema eventually resolved.  No dyspnea walking on flat ground.  + dyspnea with gardening and heavier activity.  This has been chronic and was not effected by chlorthalidone use.   Labs (11/12): LDL 65, HDL 48, K 4.4, creatinine 1.2 Labs (5/13): creatinine 1.0, LDL 68, HDL 41 Labs (5/14): LDL 87, HDL 42, K 4.3, creatinine 1.1 Labs (6/15): LDL 90, HDL 41 Labs (4/16): K 4.3, creatinine 0.84 Labs (4/17): K 4.5, creatinine 1.05  Past Medical History:  1. HYPERCHOLESTEROLEMIA  2. HTN: suspected ACEI cough.  3. BRADYCARDIA: Mild sinus bradycardia  4. Syncope/presyncope: Suspect neurocardiogenic. Situation syncope/presyncope with micturation and cough.  5. Upper motor neuron-predominant ALS: Goes once a year to the [facility] in [place]. Motor weakness.  6. GERD  7. Fatty liver.  8. PFTs (11/11) were normal.  9. Chronic diastolic CHF:  Echo (99991111): EF 55-60%, no regional wall motion abnormalities, mild RV dilation with low normal systolic function. No TR doppler jet so unable to estimate PA systolic pressure.  - Echo (4/17) with EF 123456, grade I diastolic dysfunction, mildly dilated RV with normal systolic function, PASP 23 mmHg.  10. OSA: Borderline.    Family History:   Family History of Arthritis  Family History Diabetes 2nd degree relative  Family History High cholesterol  Family History Hypertension  Family History of Prostate CA 1st degree relative <50  Family History of Cardiovascular disorder  Dad: prostate cancer, HTN, malignant, renal failure, TIA  Mom: arrythmia.Marland Kitchenatrial fibrillation? Got PCM.  2 sisters with pacemakers   Social History:  Retired, former Animal nutritionist  Married, lives in [place]  Never Smoked  Alcohol use-one beverage every 2 weeks.  Drug use-no  Regular exercise-yes..walking to mail box  Diet: fruits and veggies, water  Agent Orange exposure in [place]    Current Outpatient Prescriptions  Medication Sig Dispense Refill  . aspirin EC 81 MG tablet Take 81 mg by mouth daily.    . chlorthalidone (HYGROTON) 50 MG tablet Take 1 tablet (50 mg total) by mouth daily. 30 tablet 11  . Coenzyme Q10 (COQ10) 400 MG CAPS Take 400 mg by mouth daily.    [name] CRANBERRY PO Take 300 mg by mouth 2 (two) times daily.    . indomethacin (INDOCIN) 25 MG capsule Take 25 mg by mouth daily.     [name] losartan (COZAAR) 50 MG tablet Take 1 tablet (50 mg total) by mouth daily. 90 tablet 3  . methocarbamol (ROBAXIN) 750 MG tablet Take 750 mg by mouth 2 (two) times daily.     . Multiple Vitamin (MULTIVITAMIN WITH MINERALS) TABS Take 1 tablet by mouth daily.    . Omega-3 Fatty Acids (FISH OIL TRIPLE STRENGTH) 1400 MG CAPS Take 2,800 mg by mouth 2 (two) times daily.     [name]  omeprazole (PRILOSEC) 20 MG capsule Take 40 mg by mouth daily.     [name] OVER THE COUNTER MEDICATION Take 2,500 mcg by mouth daily with supper. Vitamin B12 2564mcg    . OVER THE COUNTER MEDICATION Take 1 tablet by mouth daily. Calcium citrate+D3 500-800 mg    . pravastatin (PRAVACHOL) 20 MG tablet Take 1 tablet (20 mg total) by mouth at bedtime. 90 tablet 3  . RESVERATROL 100 MG CAPS Take 300 mg by mouth daily.     [name] tiZANidine (ZANAFLEX) 4 MG tablet Take 4 mg by mouth at bedtime as needed for  muscle spasms.     . traMADol (ULTRAM) 50 MG tablet Take 50 mg by mouth 2 (two) times daily as needed for pain.      No current facility-administered medications for this visit.    BP 124/82 mmHg  Pulse 88  Ht 5\' 11"  (1.803 m)  Wt 247 lb (112.038 kg)  BMI 34.46 kg/m2 General: NAD, overweight.  Neck: No JVD, no thyromegaly or thyroid nodule.  Lungs: Clear to auscultation bilaterally with normal respiratory effort. CV: Nondisplaced PMI.  Heart regular S1/S2, no S3/S4, no murmur.  1+ edema 1/3 to knees bilaterally. No carotid bruit.  Normal pedal pulses.  Abdomen: Soft, nontender, no hepatosplenomegaly, no distention.  Neurologic: Alert and oriented x 3.  Psych: Normal affect. Extremities: No clubbing or cyanosis.   Assessment/Plan: 1. History of syncope: Suspect neurocardiogenic.  No recent episodes.  2. HTN: BP is controlled on current regimen.  3. Peripheral edema: Actually sounds like it was related to ALS medication.  Stopping it and starting chlorthalidone led to resolution.  He has some mild chronic dyspnea that I think is due more to muscle weakness from ALS than diastolic CHF (as resolution of edema did not effect his dyspnea pattern).  - BMET/BNP today.  - Continue chlorthalidone.    [name] 11/03/2015

## 2015-11-10 ENCOUNTER — Telehealth: Payer: Self-pay | Admitting: Family Medicine

## 2015-11-10 DIAGNOSIS — Z125 Encounter for screening for malignant neoplasm of prostate: Secondary | ICD-10-CM

## 2015-11-10 DIAGNOSIS — E7800 Pure hypercholesterolemia, unspecified: Secondary | ICD-10-CM

## 2015-11-10 NOTE — Telephone Encounter (Signed)
-----   Message from Ellamae Sia sent at 11/08/2015  5:47 PM EDT ----- Regarding: Lab orders for Friday,  Patient is scheduled for CPX labs, please order future labs, Thanks , Karna Christmas

## 2015-11-17 ENCOUNTER — Other Ambulatory Visit (INDEPENDENT_AMBULATORY_CARE_PROVIDER_SITE_OTHER): Payer: Medicare Other

## 2015-11-17 ENCOUNTER — Other Ambulatory Visit: Payer: Medicare Other

## 2015-11-17 DIAGNOSIS — Z125 Encounter for screening for malignant neoplasm of prostate: Secondary | ICD-10-CM

## 2015-11-17 DIAGNOSIS — E7800 Pure hypercholesterolemia, unspecified: Secondary | ICD-10-CM | POA: Diagnosis not present

## 2015-11-17 LAB — COMPREHENSIVE METABOLIC PANEL
ALK PHOS: 52 U/L (ref 39–117)
ALT: 31 U/L (ref 0–53)
AST: 28 U/L (ref 0–37)
Albumin: 4.4 g/dL (ref 3.5–5.2)
BILIRUBIN TOTAL: 0.7 mg/dL (ref 0.2–1.2)
BUN: 21 mg/dL (ref 6–23)
CO2: 28 meq/L (ref 19–32)
CREATININE: 1.12 mg/dL (ref 0.40–1.50)
Calcium: 9.8 mg/dL (ref 8.4–10.5)
Chloride: 103 mEq/L (ref 96–112)
GFR: 68.52 mL/min (ref >60.00)
GLUCOSE: 104 mg/dL — AB (ref 70–99)
Potassium: 4.1 mEq/L (ref 3.5–5.1)
SODIUM: 139 meq/L (ref 135–145)
TOTAL PROTEIN: 6.7 g/dL (ref 6.0–8.3)

## 2015-11-17 LAB — LIPID PANEL
CHOL/HDL RATIO: 4
Cholesterol: 159 mg/dL (ref 0–200)
HDL: 41 mg/dL (ref >39.00)
LDL Cholesterol: 89 mg/dL (ref 0–99)
NONHDL: 117.96
Triglycerides: 147 mg/dL (ref 0.0–149.0)
VLDL: 29.4 mg/dL (ref 0.0–40.0)

## 2015-11-17 LAB — PSA, MEDICARE: PSA: 1.83 ng/mL (ref 0.10–4.00)

## 2015-11-21 ENCOUNTER — Encounter: Payer: Self-pay | Admitting: Family Medicine

## 2015-11-21 ENCOUNTER — Ambulatory Visit (INDEPENDENT_AMBULATORY_CARE_PROVIDER_SITE_OTHER): Payer: Medicare Other | Admitting: Family Medicine

## 2015-11-21 VITALS — BP 110/72 | HR 75 | Temp 98.5°F | Ht 71.5 in | Wt 248.5 lb

## 2015-11-21 DIAGNOSIS — E7800 Pure hypercholesterolemia, unspecified: Secondary | ICD-10-CM | POA: Diagnosis not present

## 2015-11-21 DIAGNOSIS — I10 Essential (primary) hypertension: Secondary | ICD-10-CM | POA: Diagnosis not present

## 2015-11-21 DIAGNOSIS — R0609 Other forms of dyspnea: Secondary | ICD-10-CM

## 2015-11-21 DIAGNOSIS — Z0000 Encounter for general adult medical examination without abnormal findings: Secondary | ICD-10-CM | POA: Diagnosis not present

## 2015-11-21 DIAGNOSIS — G1229 Other motor neuron disease: Secondary | ICD-10-CM

## 2015-11-21 DIAGNOSIS — I714 Abdominal aortic aneurysm, without rupture, unspecified: Secondary | ICD-10-CM

## 2015-11-21 DIAGNOSIS — R609 Edema, unspecified: Secondary | ICD-10-CM

## 2015-11-21 DIAGNOSIS — G1221 Amyotrophic lateral sclerosis: Secondary | ICD-10-CM

## 2015-11-21 NOTE — Assessment & Plan Note (Signed)
Followed by Neuro at [facility].

## 2015-11-21 NOTE — Patient Instructions (Addendum)
Consider using walker or cane more.  Work on low [employer]. Walk as able for exercise.  Work on smaller portion.  Get TDAP at pharmacy.

## 2015-11-21 NOTE — Assessment & Plan Note (Signed)
Well controlled. Continue current medication.  

## 2015-11-21 NOTE — Assessment & Plan Note (Signed)
Stable, likely due to upper motor neuron disease and deconditioning.

## 2015-11-21 NOTE — Progress Notes (Signed)
70 year old male presents for medicare wellness.  I have personally reviewed the Medicare Annual Wellness questionnaire and have noted 1. [name] medical and social history 2. Their use of alcohol, tobacco or illicit drugs 3. Their current medications and supplements 4. [name] functional ability including ADL's, fall risks, home safety risks and hearing or visual             impairment. 5. Diet and physical activities 6. Evidence for depression or mood disorders 7.         Updated provider list Cognitive evaluation was performed and recorded on pt medicare questionnaire form. The patients weight, height, BMI and visual acuity have been recorded in the chart  I have made referrals, counseling and provided education to the patient based review of the above and I have provided the pt with a written personalized care plan for preventive services.    PLS/upper motor dominant AS: Continue slow progression change in follow up few months ago at [facility]. he has walker and cane.. Has not been using much. Pt doing fairly well.  Falling 1-2 times a month.  Moves slowly to avoid. Tolerate left rotator cuff surgery well in 05/2012.   Followed by cardiologist for hx of neurogenic syncope. No further episodes.  Reviewed last OV 10/2015  Diagnosis of fusiform  Terminal AAA, 29 mm per pt. [facility] finding [facility]   [facility] [place] in 2012 did not note AAA, but partial obsured with gas. Per pt , he was told no further follow up needed given type of AAA and his age at diagnosis.  Sleep apnea, followed by Dr. Halford Chessman.  Has lost weight so recent sleep study showed mild sleep apnea. He is not on CPAP.  Hypertension: Well controlled on losartan and chlorthalidone BP Readings from Last 3 Encounters:  11/01/15 124/82  08/24/15 118/78  08/08/15 130/74   RBBB, bradycardia Followed by [facility], Dr. Aundra Dubin.  Using medication without problems or lightheadedness: None Chest pain with exertion: No   Edema:better of ALS med. Short of breath: stable... maybe some better Average home BPs: not checking Other issues:  Wt Readings from Last 3 Encounters:  11/21/15 248 lb 8 oz (112.7 kg)  11/01/15 247 lb (112 kg)  08/24/15 246 lb 8 oz (111.8 kg)    Elevated Cholesterol: On pravachol 20. Now on fish oil... LDL at goal!, Tri at goal.  Lab Results  Component Value Date   CHOL 159 11/17/2015   HDL 41.00 11/17/2015   LDLCALC 89 11/17/2015   LDLDIRECT 125.8 10/26/2009   TRIG 147.0 11/17/2015   CHOLHDL 4 11/17/2015  Using medications without problems:None  Muscle aches: None  Diet compliance: moderate. Exercise:Limited by ALS. Walking to [employer].  Other complaints:   Prediabetes improved from last check.    Social History /Family History/Past Medical History reviewed and updated if needed.  .  Review of Systems  Constitutional: Negative for fever, fatigue and unexpected weight change.  HENT: Negative for ear pain, congestion, sore throat, rhinorrhea, trouble swallowing and postnasal drip.  Eyes: Negative for pain.  Respiratory: Negative for cough, shortness of breath and wheezing.  Cardiovascular: Negative for chest pain, palpitations and leg swelling.  Gastrointestinal: Negative for nausea, abdominal pain, diarrhea, constipation and blood in stool.  Genitourinary: Negative for dysuria, some urinary Urgency, no nocturia, hematuria, discharge, penile swelling, scrotal swelling, difficulty urinating, penile pain and testicular pain.  Skin: Negative for rash.  Neurological: Negative for syncope, weakness, light-headedness, numbness and headaches.  Psychiatric/Behavioral: Negative for  behavioral problems and dysphoric mood. The patient is not nervous/anxious.  Objective:   Physical Exam  Constitutional: He appears well-developed and well-nourished. Non-toxic appearance. He does not appear ill. No distress.  HENT:  Head: Normocephalic and atraumatic.  Right  Ear: Hearing, tympanic membrane, external ear and ear canal normal.  Left Ear: Hearing, tympanic membrane, external ear and ear canal normal.  Nose: Nose normal.  Mouth/Throat: Uvula is midline, oropharynx is clear and moist and mucous membranes are normal.  Eyes: Conjunctivae, EOM and lids are normal. Pupils are equal, round, and reactive to light. No foreign bodies found.  Neck: Trachea normal, normal range of motion and phonation normal. Neck supple. Carotid bruit is not present. No mass and no thyromegaly present.  Cardiovascular: Normal rate, regular rhythm, S1 normal, S2 normal, intact distal pulses and normal pulses. Exam reveals no gallop.  No murmur heard.  Pulmonary/Chest: Breath sounds normal. He has no wheezes. He has no rhonchi. He has no rales.  Abdominal: Soft. Normal appearance and bowel sounds are normal. There is no hepatosplenomegaly. There is no tenderness. There is no rebound, no guarding and no CVA tenderness. No hernia.  Genitourinary: . Not indicated.  Musculoskeletal:  Lumbar back: He exhibits decreased range of motion and tenderness. He exhibits no bony tenderness.  Lymphadenopathy:  He has no cervical adenopathy.  Neurological: He is alert. He has normal reflexes. He displays atrophy. No cranial nerve deficit or sensory deficit. He exhibits abnormal muscle tone. Gait normal.  Chronic changes from PLS  Skin: Skin is warm, dry and intact. No rash noted.  Psychiatric: He has a normal mood and affect. His speech is normal and behavior is normal. Judgment normal.  Assessment & Plan:   [name] preventative maintenance and recommended screening tests for an annual wellness exam were reviewed in full today.  Brought up to date unless services declined.  Counselled on the importance of diet, exercise, and its role in overall health and mortality.  [name] FH and SH was reviewed, including their home life, tobacco status, and drug and alcohol status.    Vaccines: Due for TDap,  Uptodate Pneumvax and prevnar and shingles. Prostate: Father with prostate cancer age 76s.  Discussed in detail, have chose to check PSA and rectal exam yearly. Lab Results  Component Value Date   PSA 1.83 11/17/2015   PSA 1.76 11/10/2014   PSA 2.03 09/20/2013   Colon: Sister with colon cancer. Had repeat scheduled 10/12/13 at [facility], repeat in 5 years.

## 2015-11-21 NOTE — Progress Notes (Signed)
Pre visit review using our clinic review tool, if applicable. No additional management support is needed unless otherwise documented below in the visit note. 

## 2015-11-21 NOTE — Assessment & Plan Note (Addendum)
Improved on chlorthalidone and ff ALS med contributing.

## 2015-12-22 ENCOUNTER — Other Ambulatory Visit: Payer: Self-pay | Admitting: Cardiology

## 2015-12-27 ENCOUNTER — Other Ambulatory Visit: Payer: Self-pay

## 2016-01-04 ENCOUNTER — Other Ambulatory Visit: Payer: Self-pay | Admitting: Unknown Physician Specialty

## 2016-01-04 DIAGNOSIS — M47816 Spondylosis without myelopathy or radiculopathy, lumbar region: Secondary | ICD-10-CM

## 2016-01-12 ENCOUNTER — Ambulatory Visit
Admission: RE | Admit: 2016-01-12 | Discharge: 2016-01-12 | Disposition: A | Discharge status: Home / Self Care | Payer: Medicare Other | Source: Ambulatory Visit | Attending: Unknown Physician Specialty | Admitting: Unknown Physician Specialty

## 2016-01-12 DIAGNOSIS — M47816 Spondylosis without myelopathy or radiculopathy, lumbar region: Secondary | ICD-10-CM

## 2016-03-13 ENCOUNTER — Ambulatory Visit (INDEPENDENT_AMBULATORY_CARE_PROVIDER_SITE_OTHER): Payer: Medicare Other | Admitting: Family Medicine

## 2016-03-13 ENCOUNTER — Telehealth: Payer: Self-pay | Admitting: Family Medicine

## 2016-03-13 ENCOUNTER — Encounter: Payer: Self-pay | Admitting: Family Medicine

## 2016-03-13 VITALS — BP 108/71 | HR 83 | Temp 98.2°F | Resp 20 | Wt 243.0 lb

## 2016-03-13 DIAGNOSIS — R1013 Epigastric pain: Secondary | ICD-10-CM

## 2016-03-13 LAB — LIPASE: Lipase: 19 U/L (ref 11.0–59.0)

## 2016-03-13 LAB — COMPREHENSIVE METABOLIC PANEL
ALK PHOS: 70 U/L (ref 39–117)
ALT: 91 U/L — AB (ref 0–53)
AST: 50 U/L — ABNORMAL HIGH (ref 0–37)
Albumin: 4.5 g/dL (ref 3.5–5.2)
BILIRUBIN TOTAL: 1.5 mg/dL — AB (ref 0.2–1.2)
BUN: 22 mg/dL (ref 6–23)
CALCIUM: 10.2 mg/dL (ref 8.4–10.5)
CO2: 27 mEq/L (ref 19–32)
CREATININE: 1.27 mg/dL (ref 0.40–1.50)
Chloride: 100 mEq/L (ref 96–112)
GFR: 59.22 mL/min — AB (ref >60.00)
Glucose, Bld: 97 mg/dL (ref 70–99)
Potassium: 4.1 mEq/L (ref 3.5–5.1)
Sodium: 139 mEq/L (ref 135–145)
TOTAL PROTEIN: 6.9 g/dL (ref 6.0–8.3)

## 2016-03-13 LAB — CBC WITH DIFFERENTIAL/PLATELET
BASOS ABS: 0.1 10*3/uL (ref 0.0–0.1)
Basophils Relative: 0.4 % (ref 0.0–3.0)
Eosinophils Absolute: 0.1 10*3/uL (ref 0.0–0.7)
Eosinophils Relative: 0.6 % (ref 0.0–5.0)
HEMATOCRIT: 44.1 % (ref 39.0–52.0)
HEMOGLOBIN: 15 g/dL (ref 13.0–17.0)
LYMPHS PCT: 16.1 % (ref 12.0–46.0)
Lymphs Abs: 2.1 10*3/uL (ref 0.7–4.0)
MCHC: 34 g/dL (ref 30.0–36.0)
MCV: 95.7 fl (ref 78.0–100.0)
MONOS PCT: 10.7 % (ref 3.0–12.0)
Monocytes Absolute: 1.4 10*3/uL — ABNORMAL HIGH (ref 0.1–1.0)
Neutro Abs: 9.3 10*3/uL — ABNORMAL HIGH (ref 1.4–7.7)
Neutrophils Relative %: 72.2 % (ref 43.0–77.0)
Platelets: 209 10*3/uL (ref 150.0–400.0)
RBC: 4.61 Mil/uL (ref 4.22–5.81)
RDW: 14.3 % (ref 11.5–15.5)
WBC: 12.9 10*3/uL — AB (ref 4.0–10.5)

## 2016-03-13 NOTE — Patient Instructions (Signed)
I have completed labs to check gallbladder, pancreas and for infection. If worsening please go to ED. II am uncertain the cause of your discomfort. Depending on your labs we may need to obtain an Ultrasound.   Rest and hydrate.

## 2016-03-13 NOTE — Telephone Encounter (Signed)
Attempted to call pt this evening to discuss abnormal labs twice. I was unable to reach him or his emergency contact (same phone number listed). I left a message on his home phone, advising him to be seen in ED (no names or identifiers given, since it was not a personalized voice mail box.)   - Briefly, Pt was seen today in the office for epigastric pain. Labs collected are consistent with possible cholecystitis elevated WBC, left shift, elevated LFT and bili.   Please verify he received message/went to ED. If he did not and his symptoms improved, I want him to have imaging and repeat labs. Have him follow-up with either myself or his PCP ASAP. If he is worsening he should go to ED.

## 2016-03-13 NOTE — Telephone Encounter (Signed)
Patient Name: Edward Frank DOB: 04-14-1946 Initial Comment husband has pain in upper abdomen across, not wanting to eat much, no other symptoms Nurse Assessment Nurse: Vallery Sa, RN, [place] Date/Time (Eastern Time): 03/13/2016 10:33:10 AM Confirm and document reason for call. If symptomatic, describe symptoms. You must click the next button to save text entered. ---[name] developed upper abdominal pain 2 days ago (pain rated as a 5 on the 1 to 10 scale). No injury in the past 3 days. No vomiting or diarrhea. No fever. Alert and responsive. Does the patient have any new or worsening symptoms? ---Yes Will a triage be completed? ---Yes Related visit to physician within the last 2 weeks? ---No Does the PT have any chronic conditions? (i.e. diabetes, asthma, etc.) ---Yes List chronic conditions. ---ALS, High Blood Pressure Is this a behavioral health or substance abuse call? ---No Guidelines Guideline Title Affirmed Question Affirmed Notes Abdominal Pain - Upper [1] MILD-MODERATE pain AND [2] not relieved by antacids Final Disposition User See Physician within 4 Hours (or PCP triage) Vallery Sa, RN, Cathy Comments Scheduled for 1:30pm appointment with Howard Pouch at [facility] office. Referrals REFERRED TO PCP OFFICE Disagree/Comply: Comply

## 2016-03-13 NOTE — Progress Notes (Signed)
Edward Frank , [birth year], 70 y.o., male MRN: [id number] Patient Care Team    Relationship Specialty Notifications Start End  [name], MD PCP - General   04/04/10    Comment: Merged    CC: abd pain Subjective: Pt presents for an acute OV with complaints of upper abd pain of 2 days duration.  Associated symptoms include pain that bands across his upper abdomen, last about 4 hours and then self resolved yesterday and then re-occured today. He states it again resolved today. He did tolerate toast and fluids today, but he does not feel like he wants to eat much. He denies alcohol use or h/o of gallstones. He endorses nausea, boating (need to belch). He denies fever, chills, vomit. He had a BM 2 days ago that was harder than his usual, but he did not need to strain. He has a h/o of GERD is currently taking PPI. He reports his last colonoscopy was at the [place] in 2014, and reports it was normal. He has never had an EGD.   Social History  Substance Use Topics  . Smoking status: Never Smoker  . Smokeless tobacco: Never Used  . Alcohol use 0.0 oz/week     Comment: rarely   Past Medical History:  Diagnosis Date  . Arthritis   . Bradycardia   . Fatty liver   . Fracture, tibia, with fibula teenage yrs.    no surgery, wore cast  . GERD (gastroesophageal reflux disease)   . Headache(784.0)   . Hyperlipidemia   . Hypertension   . OSA (obstructive sleep apnea) 01/22/2011  . Primary lateral sclerosis   . Right bundle branch block   . Syncope    Past Surgical History:  Procedure Laterality Date  . FOOT SURGERY  11-2008   hammer toe and bunion  . HERNIA REPAIR  12-2001   inguinal hernia bilateral  . SHOULDER ARTHROSCOPY WITH ROTATOR CUFF REPAIR AND SUBACROMIAL DECOMPRESSION Right 06/18/2012   Procedure: RIGHT SHOULDER ARTHROSCOPY WITH SUBACROMIAL DECOMPRESSION AND DISTAL CLAVICLE RESECTION AND ROTATOR CUFF REPAIR;  Surgeon: Marin Shutter, MD;  Location: [facility];  Service: Orthopedics;   Laterality: Right;  . SHOULDER SURGERY  1977   Luxating    Family History  Problem Relation Age of Onset  . Heart disease Mother     ? afib and got pacemaker  . Uterine cancer Mother   . Prostate cancer Father   . Hypertension Father   . Kidney failure Father   . Stroke Father   . Colon cancer Sister 28     Medication List       Accurate as of 03/13/16  1:10 PM. Always use your most recent med list.          aspirin EC 81 MG tablet Take 81 mg by mouth daily.   chlorthalidone 50 MG tablet Commonly known as:  HYGROTON Take 1 tablet (50 mg total) by mouth daily.   CoQ10 400 MG Caps Take 400 mg by mouth daily.   CRANBERRY PO Take 300 mg by mouth 2 (two) times daily.   desonide 0.05 % cream Commonly known as:  DESOWEN Apply topically.   FISH OIL TRIPLE STRENGTH 1400 MG Caps Take 2,800 mg by mouth 2 (two) times daily.   indomethacin 25 MG capsule Commonly known as:  INDOCIN Take 25 mg by mouth daily.   losartan 50 MG tablet Commonly known as:  COZAAR Take 1 tablet (50 mg total) by mouth daily.  COZAAR 50 MG tablet Generic drug:  losartan TAKE ONE TABLET BY MOUTH ONCE DAILY   methocarbamol 750 MG tablet Commonly known as:  ROBAXIN Take 750 mg by mouth 2 (two) times daily.   multivitamin with minerals Tabs tablet Take 1 tablet by mouth daily.   omeprazole 20 MG capsule Commonly known as:  PRILOSEC Take 40 mg by mouth daily.   OVER THE COUNTER MEDICATION Take 2,500 mcg by mouth daily with supper. Vitamin B12 2546mg   OVER THE COUNTER MEDICATION Take 1 tablet by mouth daily. Calcium citrate+D3 500-800 mg   pravastatin 20 MG tablet Commonly known as:  PRAVACHOL Take 1 tablet (20 mg total) by mouth at bedtime.   Resveratrol 100 MG Caps Take 300 mg by mouth daily.   tiZANidine 4 MG tablet Commonly known as:  ZANAFLEX Take 4 mg by mouth at bedtime as needed for muscle spasms.   traMADol 50 MG tablet Commonly known as:  ULTRAM Take 50 mg by  mouth 2 (two) times daily as needed for pain.       No results found for this or any previous visit (from the past 24 hour(s)). No results found.   ROS: Negative, with the exception of above mentioned in HPI   Objective:  BP 108/71 (BP Location: Right Arm, Patient Position: Sitting, Cuff Size: Large)   Pulse 83   Temp 98.2 F (36.8 C)   Resp 20   Wt 243 lb (110.2 kg)   SpO2 96%   BMI 33.42 kg/m  Body mass index is 33.42 kg/m. Gen: Afebrile. No acute distress. Nontoxic in appearance, well developed, well nourished. Pleasant caucasian male.  HENT: AT. [place]. MMM Eyes:Pupils Equal Round Reactive to light, Extraocular movements intact,  Conjunctiva without redness, discharge or icterus. Neck/lymp/endocrine: Supple,no lymphadenopathy CV: RRR, no edema Chest: CTAB, no wheeze or crackles. Good air movement, normal resp effort.  Abd: Soft. obese. NTND. BS present. no Masses palpated. No rebound or guarding. No HSM. Neg murphy's sign.  Neuro: Normal gait. PERLA. EOMi. Alert. Oriented x3   Assessment/Plan: Edward WONGis a 70y.o. male present for acute OV for  Epigastric pain - DDX: pancreatitis, gallbladder etiology, hiatal hernia - discussed with patient he has no alarm signs on exam and he seems to be improving. Will perform labs to r/o infection, GB dysfunction and pancreas .  - CBC w/Diff - Comp Met (CMET) - Lipase - pt to monitor for worsening symptoms or abd pain. If he experiences he can be seen at walk in clinic or ED if severe.  - F/u with PCP early next week if not improving. Depending on labs may consider [place].    electronically signed by:  RHoward Pouch DO  [place]

## 2016-03-13 NOTE — Telephone Encounter (Signed)
Pt has appt with Dr Raoul Pitch 03/13/16 at 1:30

## 2016-03-18 ENCOUNTER — Ambulatory Visit (INDEPENDENT_AMBULATORY_CARE_PROVIDER_SITE_OTHER): Payer: Medicare Other | Admitting: Family Medicine

## 2016-03-18 ENCOUNTER — Telehealth: Payer: Self-pay | Admitting: Family Medicine

## 2016-03-18 ENCOUNTER — Encounter: Payer: Self-pay | Admitting: Family Medicine

## 2016-03-18 VITALS — BP 106/70 | HR 79 | Temp 98.0°F | Resp 20 | Wt 236.5 lb

## 2016-03-18 DIAGNOSIS — R1013 Epigastric pain: Secondary | ICD-10-CM

## 2016-03-18 DIAGNOSIS — R17 Unspecified jaundice: Secondary | ICD-10-CM

## 2016-03-18 DIAGNOSIS — R748 Abnormal levels of other serum enzymes: Secondary | ICD-10-CM

## 2016-03-18 LAB — BILIRUBIN, FRACTIONATED(TOT/DIR/INDIR)
BILIRUBIN INDIRECT: 0.5 mg/dL (ref 0.2–1.2)
BILIRUBIN TOTAL: 0.6 mg/dL (ref 0.2–1.2)
Bilirubin, Direct: 0.1 mg/dL (ref <=0.2)

## 2016-03-18 NOTE — Telephone Encounter (Signed)
Patient has a scheduled appt today this issue will be addressed then.

## 2016-03-18 NOTE — Telephone Encounter (Signed)
Spoke with patient wife reviewed lab results and instructions for follow up. Patient wife states she will call and try to get him in to his PCP. If no appts available she will call back to schedule appt here.

## 2016-03-18 NOTE — Patient Instructions (Signed)
I have ordered an Ultrasound of your abdomen to be done ASAP.  I am also completing repeat labs to make certain they are returning to normal.  I will call results.    Cholelithiasis Cholelithiasis is a form of gallbladder disease in which gallstones form in the gallbladder. The gallbladder is an organ that stores bile. Bile is made in the liver, and it helps to digest fats. Gallstones begin as small crystals and slowly grow into stones. They may cause no symptoms until the gallbladder tightens (contracts) and a gallstone is blocking the duct (gallbladder attack), which can cause pain. Cholelithiasis is also referred to as gallstones. There are two main types of gallstones:  Cholesterol stones. These are made of hardened cholesterol and are usually yellow-green in color. They are the most common type of gallstone. Cholesterol is a white, waxy, fat-like substance that is made in the liver.  Pigment stones. These are dark in color and are made of a red-yellow substance that forms when hemoglobin from red blood cells breaks down (bilirubin). What are the causes? This condition may be caused by an imbalance in the substances that bile is made of. This can happen if the bile:  Has too much bilirubin.  Has too much cholesterol.  Does not have enough bile salts. These salts help the body absorb and digest fats. In some cases, this condition can also be caused by the gallbladder not emptying completely or often enough. What increases the risk? The following factors may make you more likely to develop this condition:  Being male.  Having multiple pregnancies. Health care providers sometimes advise removing diseased gallbladders before future pregnancies.  Eating a diet that is heavy in fried foods, fat, and refined carbohydrates, like white bread and white rice.  Being obese.  Being older than age 7.  Prolonged use of medicines that contain male hormones (estrogen).  Having diabetes  mellitus.  Rapidly losing weight.  Having a family history of gallstones.  Being of [place] or [place] descent.  Having an intestinal disease such as Crohn disease.  Having metabolic syndrome.  Having cirrhosis.  Having severe types of anemia such as sickle cell anemia. What are the signs or symptoms? In most cases, there are no symptoms. These are known as silent gallstones. If a gallstone blocks the bile ducts, it can cause a gallbladder attack. The main symptom of a gallbladder attack is sudden pain in the upper right abdomen. The pain usually comes at night or after eating a large meal. The pain can last for one or several hours and can spread to the right shoulder or chest. If the bile duct is blocked for more than a few hours, it can cause infection or inflammation of the gallbladder, liver, or pancreas, which may cause:  Nausea.  Vomiting.  Abdominal pain that lasts for 5 hours or more.  Fever or chills.  Yellowing of the skin or the whites of the eyes (jaundice).  Dark urine.  Light-colored stools. How is this diagnosed? This condition may be diagnosed based on:  A physical exam.  Your medical history.  An ultrasound of your gallbladder.  CT scan.  MRI.  Blood tests to check for signs of infection or inflammation.  A scan of your gallbladder and bile ducts (biliary system) using nonharmful radioactive material and special cameras that can see the radioactive material (cholescintigram). This test checks to see how your gallbladder contracts and whether bile ducts are blocked.  Inserting a small tube with  a camera on the end (endoscope) through your mouth to inspect bile ducts and check for blockages (endoscopic retrograde cholangiopancreatogram). How is this treated? Treatment for gallstones depends on the severity of the condition. Silent gallstones do not need treatment. If the gallstones cause a gallbladder attack or other symptoms, treatment may  be required. Options for treatment include:  Surgery to remove the gallbladder (cholecystectomy). This is the most common treatment.  Medicines to dissolve gallstones. These are most effective at treating small gallstones. You may need to take medicines for up to 6-12 months.  Shock wave treatment (extracorporeal biliary lithotripsy). In this treatment, an ultrasound machine sends shock waves to the gallbladder to break gallstones into smaller pieces. These pieces can then be passed into the intestines or be dissolved by medicine. This is rarely used.  Removing gallstones through endoscopic retrograde cholangiopancreatogram. A small basket can be attached to the endoscope and used to capture and remove gallstones. Follow these instructions at home:  Take over-the-counter and prescription medicines only as told by your health care provider.  Maintain a healthy weight and follow a healthy diet. This includes:  Reducing fatty foods, such as fried food.  Reducing refined carbohydrates, like white bread and white rice.  Increasing fiber. Aim for foods like almonds, fruit, and beans.  Keep all follow-up visits as told by your health care provider. This is important. Contact a health care provider if:  You think you have had a gallbladder attack.  You have been diagnosed with silent gallstones and you develop abdominal pain or indigestion. Get help right away if:  You have pain from a gallbladder attack that lasts for more than 2 hours.  You have abdominal pain that lasts for more than 5 hours.  You have a fever or chills.  You have persistent nausea and vomiting.  You develop jaundice.  You have dark urine or light-colored stools. Summary  Cholelithiasis (also called gallstones) is a form of gallbladder disease in which gallstones form in the gallbladder.  This condition is caused by an imbalance in the substances that make up bile. This can happen if the bile has too much  cholesterol, too much bilirubin, or not enough bile salts.  You are more likely to develop this condition if you are male, pregnant, using medicines with estrogen, obese, older than age 69, or have a family history of gallstones. You may also develop gallstones if you have diabetes, an intestinal disease, cirrhosis, or metabolic syndrome.  Treatment for gallstones depends on the severity of the condition. Silent gallstones do not need treatment.  If gallstones cause a gallbladder attack or other symptoms, treatment may be needed. The most common treatment is surgery to remove the gallbladder. This information is not intended to replace advice given to you by your health care provider. Make sure you discuss any questions you have with your health care provider. Document Released: 04/04/2005 Document Revised: 12/24/2015 Document Reviewed: 12/24/2015 Elsevier Interactive Patient Education  2017 [employer].

## 2016-03-18 NOTE — Progress Notes (Signed)
Edward Frank , 09/09/1945, 70 y.o., male MRN: [id number] Patient Care Team    Relationship Specialty Notifications Start End  [name], MD PCP - General   04/04/10    Comment: Merged    CC: abd pain Subjective:  Pt presents for follow up on his epigastric pain. His labs returned with elevated LFT/bili/WBc and decrease in GFR, the day prior to the holiday. He was encouraged to go to ED, and pt decided to wait since his pain had improved. His appetite had improved, but he has been hesitant to eat a full meal. He does not routinely drink much fluid either. He admits today to have similar attacks in the past, but not as bad as last week. He reports "tightness" around his upper abdomen present today. He denies changes in his stool or bladder habits, he reports he has had a hard small BM yesterday. He reports his children have all had their gallbladders removed secondary to stone formation. He denies fever, chills, nausea or vomit. He is in no pain currently.   Prior note: Pt presents for an acute OV with complaints of upper abd pain of 2 days duration.  Associated symptoms include pain that bands across his upper abdomen, last about 4 hours and then self resolved yesterday and then re-occured today. He states it again resolved today. He did tolerate toast and fluids today, but he does not feel like he wants to eat much. He denies alcohol use or h/o of gallstones. He endorses nausea, boating (need to belch). He denies fever, chills, vomit. He had a BM 2 days ago that was harder than his usual, but he did not need to strain. He has a h/o of GERD is currently taking PPI. He reports his last colonoscopy was at the [place] in 2014, and reports it was normal. He has never had an EGD.    Social History  Substance Use Topics  . Smoking status: Never Smoker  . Smokeless tobacco: Never Used  . Alcohol use 0.0 oz/week     Comment: rarely   Past Medical History:  Diagnosis Date  . Arthritis   .  Bradycardia   . Fatty liver   . Fracture, tibia, with fibula teenage yrs.    no surgery, wore cast  . GERD (gastroesophageal reflux disease)   . Headache(784.0)   . Hyperlipidemia   . Hypertension   . OSA (obstructive sleep apnea) 01/22/2011  . Primary lateral sclerosis   . Right bundle branch block   . Syncope    Past Surgical History:  Procedure Laterality Date  . FOOT SURGERY  11-2008   hammer toe and bunion  . HERNIA REPAIR  12-2001   inguinal hernia bilateral  . SHOULDER ARTHROSCOPY WITH ROTATOR CUFF REPAIR AND SUBACROMIAL DECOMPRESSION Right 06/18/2012   Procedure: RIGHT SHOULDER ARTHROSCOPY WITH SUBACROMIAL DECOMPRESSION AND DISTAL CLAVICLE RESECTION AND ROTATOR CUFF REPAIR;  Surgeon: Marin Shutter, MD;  Location: [place];  Service: Orthopedics;  Laterality: Right;  . SHOULDER SURGERY  1977   Luxating    Family History  Problem Relation Age of Onset  . Heart disease Mother     ? afib and got pacemaker  . Uterine cancer Mother   . Prostate cancer Father   . Hypertension Father   . Kidney failure Father   . Stroke Father   . Colon cancer Sister 89     Medication List       Accurate as of 03/18/16  3:21  PM. Always use your most recent med list.          aspirin EC 81 MG tablet Take 81 mg by mouth daily.   chlorthalidone 50 MG tablet Commonly known as:  HYGROTON Take 1 tablet (50 mg total) by mouth daily.   CoQ10 400 MG Caps Take 400 mg by mouth daily.   CRANBERRY PO Take 300 mg by mouth 2 (two) times daily.   desonide 0.05 % cream Commonly known as:  DESOWEN Apply topically.   FISH OIL TRIPLE STRENGTH 1400 MG Caps Take 2,800 mg by mouth 2 (two) times daily.   indomethacin 25 MG capsule Commonly known as:  INDOCIN Take 25 mg by mouth daily.   losartan 50 MG tablet Commonly known as:  COZAAR Take 1 tablet (50 mg total) by mouth daily.   COZAAR 50 MG tablet Generic drug:  losartan TAKE ONE TABLET BY MOUTH ONCE DAILY   methocarbamol 750 MG  tablet Commonly known as:  ROBAXIN Take 750 mg by mouth 2 (two) times daily.   multivitamin with minerals Tabs tablet Take 1 tablet by mouth daily.   omeprazole 20 MG capsule Commonly known as:  PRILOSEC Take 40 mg by mouth daily.   OVER THE COUNTER MEDICATION Take 2,500 mcg by mouth daily with supper. Vitamin B12 259mg   OVER THE COUNTER MEDICATION Take 1 tablet by mouth daily. Calcium citrate+D3 500-800 mg   pravastatin 20 MG tablet Commonly known as:  PRAVACHOL Take 1 tablet (20 mg total) by mouth at bedtime.   Resveratrol 100 MG Caps Take 300 mg by mouth daily.   tiZANidine 4 MG tablet Commonly known as:  ZANAFLEX Take 4 mg by mouth at bedtime as needed for muscle spasms.   traMADol 50 MG tablet Commonly known as:  ULTRAM Take 50 mg by mouth 2 (two) times daily as needed for pain.       No results found for this or any previous visit (from the past 24 hour(s)). No results found.   ROS: Negative, with the exception of above mentioned in HPI   Objective:  BP 106/70 (BP Location: Right Arm, Patient Position: Sitting, Cuff Size: Normal)   Pulse 79   Temp 98 F (36.7 C)   Resp 20   Wt 236 lb 8 oz (107.3 kg)   SpO2 98%   BMI 32.53 kg/m  Body mass index is 32.53 kg/m. Gen: Afebrile. No acute distress. Nontoxic in appearance, well developed, well nourished. Pleasant caucasian male.  HENT: AT. [place]. MMM Eyes:Pupils Equal Round Reactive to light, Extraocular movements intact,  Conjunctiva without redness, discharge or icterus. Neck/lymp/endocrine: Supple,no lymphadenopathy CV: RRR, no edema Chest: CTAB, no wheeze or crackles. Good air movement, normal resp effort.  Abd: Soft. obese. Mildly distended, mild TTP deep RUQ. BS present. no Masses palpated. No rebound or guarding. No HSM. Neg murphy's sign.  Neuro: Normal gait. PERLA. EOMi. Alert. Oriented x3   Recent Results (from the past 2160 hour(s))  CBC w/Diff     Status: Abnormal   Collection Time: 03/13/16   1:38 PM  Result Value Ref Range   WBC 12.9 (H) 4.0 - 10.5 K/uL   RBC 4.61 4.22 - 5.81 Mil/uL   Hemoglobin 15.0 13.0 - 17.0 g/dL   HCT 44.1 39.0 - 52.0 %   MCV 95.7 78.0 - 100.0 fl   MCHC 34.0 30.0 - 36.0 g/dL   RDW 14.3 11.5 - 15.5 %   Platelets 209.0 150.0 - 400.0 K/uL  Neutrophils Relative % 72.2 43.0 - 77.0 %   Lymphocytes Relative 16.1 12.0 - 46.0 %   Monocytes Relative 10.7 3.0 - 12.0 %   Eosinophils Relative 0.6 0.0 - 5.0 %   Basophils Relative 0.4 0.0 - 3.0 %   Neutro Abs 9.3 (H) 1.4 - 7.7 K/uL   Lymphs Abs 2.1 0.7 - 4.0 K/uL   Monocytes Absolute 1.4 (H) 0.1 - 1.0 K/uL   Eosinophils Absolute 0.1 0.0 - 0.7 K/uL   Basophils Absolute 0.1 0.0 - 0.1 K/uL  Comp Met (CMET)     Status: Abnormal   Collection Time: 03/13/16  1:38 PM  Result Value Ref Range   Sodium 139 135 - 145 mEq/L   Potassium 4.1 3.5 - 5.1 mEq/L   Chloride 100 96 - 112 mEq/L   CO2 27 19 - 32 mEq/L   Glucose, Bld 97 70 - 99 mg/dL   BUN 22 6 - 23 mg/dL   Creatinine, Ser 1.27 0.40 - 1.50 mg/dL   Total Bilirubin 1.5 (H) 0.2 - 1.2 mg/dL   Alkaline Phosphatase 70 39 - 117 U/L   AST 50 (H) 0 - 37 U/L   ALT 91 (H) 0 - 53 U/L   Total Protein 6.9 6.0 - 8.3 g/dL   Albumin 4.5 3.5 - 5.2 g/dL   Calcium 10.2 8.4 - 10.5 mg/dL   GFR 59.22 (L) >60.00 mL/min  Lipase     Status: None   Collection Time: 03/13/16  1:38 PM  Result Value Ref Range   Lipase 19.0 11.0 - 59.0 U/L    Assessment/Plan: Edward Frank is a 70 y.o. male present for acute OV for  Epigastric pain - concerns for choledocholithiasis, Gallstones/cholecystitis.  - All labs reviewed with pt and his wife in detail today.  - repeat CBC and CMP today.  - ABD [place] complete ordered. Would like to get this completed ASAP.  - pt to monitor for worsening symptoms or abd pain. If he experiences he can be seen at walk in clinic or ED if severe.  - Discussed potential surgical referral, depending on results of [place]. This appears to be a worsening chronic issue  and will likely eventually need intervention.  - pt will be called with all results and discuss plans.  > 25 minutes spent with patient, >50% of time spent face to face    electronically signed by:  Howard Pouch, DO  [place]

## 2016-03-18 NOTE — Telephone Encounter (Signed)
Patient would like have [place]. Patient is asking if he can have it without going to the ER?

## 2016-03-19 LAB — COMPREHENSIVE METABOLIC PANEL
ALBUMIN: 4.6 g/dL (ref 3.5–5.2)
ALK PHOS: 56 U/L (ref 39–117)
ALT: 42 U/L (ref 0–53)
AST: 32 U/L (ref 0–37)
BILIRUBIN TOTAL: 0.7 mg/dL (ref 0.2–1.2)
BUN: 25 mg/dL — ABNORMAL HIGH (ref 6–23)
CALCIUM: 10.2 mg/dL (ref 8.4–10.5)
CO2: 28 mEq/L (ref 19–32)
Chloride: 101 mEq/L (ref 96–112)
Creatinine, Ser: 1.22 mg/dL (ref 0.40–1.50)
GFR: 62.02 mL/min (ref >60.00)
GLUCOSE: 96 mg/dL (ref 70–99)
Potassium: 4.2 mEq/L (ref 3.5–5.1)
Sodium: 139 mEq/L (ref 135–145)
TOTAL PROTEIN: 7.4 g/dL (ref 6.0–8.3)

## 2016-03-20 ENCOUNTER — Telehealth: Payer: Self-pay | Admitting: Family Medicine

## 2016-03-20 LAB — CBC WITH DIFFERENTIAL/PLATELET
BASOS ABS: 0 10*3/uL (ref 0.0–0.1)
Basophils Relative: 0.3 % (ref 0.0–3.0)
EOS PCT: 2.1 % (ref 0.0–5.0)
Eosinophils Absolute: 0.2 10*3/uL (ref 0.0–0.7)
HEMATOCRIT: 45.4 % (ref 39.0–52.0)
HEMOGLOBIN: 15.4 g/dL (ref 13.0–17.0)
LYMPHS PCT: 26.1 % (ref 12.0–46.0)
Lymphs Abs: 2.4 10*3/uL (ref 0.7–4.0)
MCHC: 33.8 g/dL (ref 30.0–36.0)
MCV: 96.5 fl (ref 78.0–100.0)
MONOS PCT: 9.8 % (ref 3.0–12.0)
Monocytes Absolute: 0.9 10*3/uL (ref 0.1–1.0)
NEUTROS PCT: 61.7 % (ref 43.0–77.0)
Neutro Abs: 5.7 10*3/uL (ref 1.4–7.7)
Platelets: 261 10*3/uL (ref 150.0–400.0)
RBC: 4.71 Mil/uL (ref 4.22–5.81)
RDW: 13.2 % (ref 11.5–15.5)
WBC: 9.3 10*3/uL (ref 4.0–10.5)

## 2016-03-20 NOTE — Telephone Encounter (Signed)
Please call pt: - his labs have returned to baseline.  - We will call him when his [place] is complete

## 2016-03-20 NOTE — Telephone Encounter (Signed)
Patient notified and verbalized understanding. 

## 2016-03-21 ENCOUNTER — Ambulatory Visit
Admission: RE | Admit: 2016-03-21 | Discharge: 2016-03-21 | Disposition: A | Discharge status: Home / Self Care | Payer: Medicare Other | Source: Ambulatory Visit | Attending: Family Medicine | Admitting: Family Medicine

## 2016-03-21 ENCOUNTER — Other Ambulatory Visit: Payer: Medicare Other

## 2016-03-21 ENCOUNTER — Telehealth: Payer: Self-pay | Admitting: Family Medicine

## 2016-03-21 DIAGNOSIS — K8020 Calculus of gallbladder without cholecystitis without obstruction: Secondary | ICD-10-CM

## 2016-03-21 DIAGNOSIS — R1013 Epigastric pain: Secondary | ICD-10-CM

## 2016-03-21 DIAGNOSIS — R17 Unspecified jaundice: Secondary | ICD-10-CM

## 2016-03-21 DIAGNOSIS — R748 Abnormal levels of other serum enzymes: Secondary | ICD-10-CM

## 2016-03-21 NOTE — Telephone Encounter (Signed)
Patient will be traveling so please call cell phone with results. Patient will be returning to [facility] 12/5 so preferred # will be home #.

## 2016-03-22 NOTE — Telephone Encounter (Signed)
Please call pt (preferred # in prior note from [place]): - his [place] resulted with gallstones as suspected. Some of the stones that remain in his gallbladder, are rather large and would cause a great deal of pain/complication if they tried to pass. My recommendation is that he see a surgeon for recommendations, and I suspect they will want to remove the gallbladder.  - he also had a small aortic aneurysm in is abdomen. The is a very mild dilation in his aorta. This is rather common in men his age, but should be monitored. recommendations for small AAA are to [place] again in 2 years.  - I have placed the referral to surgery for him, he should be hearing from them soon to schedule.

## 2016-03-22 NOTE — Telephone Encounter (Signed)
Spoke with patient reviewed [place] results and instructions. Patient verbalized understanding .

## 2016-03-26 ENCOUNTER — Encounter: Payer: Self-pay | Admitting: Family Medicine

## 2016-05-22 ENCOUNTER — Ambulatory Visit: Payer: Self-pay | Admitting: General Surgery

## 2016-06-06 NOTE — Patient Instructions (Signed)
[place]  06/06/2016   Your procedure is scheduled on: 06/13/16  Report to [facility] Main  Entrance take [place]  elevators to 3rd floor to  [place] at    0700 AM.  Call this number if you have problems the morning of surgery [phone]   Remember: ONLY 1 PERSON MAY GO WITH YOU TO SHORT STAY TO GET  READY MORNING OF [place].  Do not eat food or drink liquids :After Midnight.     Take these medicines the morning of surgery with A SIP OF WATER:  prilosec                               You may not have any metal on your body including hair pins and              piercings  Do not wear jewelry,  lotions, powders or perfumes, deodorant                         Men may shave face and neck.   Do not bring valuables to the hospital. [place].  Contacts, dentures or bridgework may not be worn into surgery.  Leave suitcase in the car. After surgery it may be brought to your room.                 Please read over the following fact sheets you were given: _____________________________________________________________________             [facility] - Preparing for Surgery Before surgery, you can play an important role.  Because skin is not sterile, your skin needs to be as free of germs as possible.  You can reduce the number of germs on your skin by washing with CHG (chlorahexidine gluconate) soap before surgery.  CHG is an antiseptic cleaner which kills germs and bonds with the skin to continue killing germs even after washing. Please DO NOT use if you have an allergy to CHG or antibacterial soaps.  If your skin becomes reddened/irritated stop using the CHG and inform your nurse when you arrive at Short Stay. Do not shave (including legs and underarms) for at least 48 hours prior to the first CHG shower.  You may shave your face/neck. Please follow these instructions carefully:  1.  Shower with CHG  Soap the night before surgery and the  morning of Surgery.  2.  If you choose to wash your hair, wash your hair first as usual with your  normal  shampoo.  3.  After you shampoo, rinse your hair and body thoroughly to remove the  shampoo.                           4.  Use CHG as you would any other liquid soap.  You can apply chg directly  to the skin and wash                       Gently with a scrungie or clean washcloth.  5.  Apply the CHG Soap to your body ONLY FROM THE NECK DOWN.   Do not use on  face/ open                           Wound or open sores. Avoid contact with eyes, ears mouth and genitals (private parts).                       Wash face,  Genitals (private parts) with your normal soap.             6.  Wash thoroughly, paying special attention to the area where your surgery  will be performed.  7.  Thoroughly rinse your body with warm water from the neck down.  8.  DO NOT shower/wash with your normal soap after using and rinsing off  the CHG Soap.                9.  Pat yourself dry with a clean towel.            10.  Wear clean pajamas.            11.  Place clean sheets on your bed the night of your first shower and do not  sleep with pets. Day of Surgery : Do not apply any lotions/deodorants the morning of surgery.  Please wear clean clothes to the hospital/surgery center.  FAILURE TO FOLLOW THESE INSTRUCTIONS MAY RESULT IN THE CANCELLATION OF YOUR SURGERY PATIENT SIGNATURE_________________________________  NURSE SIGNATURE__________________________________  ________________________________________________________________________

## 2016-06-10 ENCOUNTER — Encounter (HOSPITAL_COMMUNITY)
Admission: RE | Admit: 2016-06-10 | Discharge: 2016-06-10 | Disposition: A | Discharge status: Home / Self Care | Payer: Medicare Other | Source: Ambulatory Visit | Attending: General Surgery | Admitting: General Surgery

## 2016-06-10 ENCOUNTER — Encounter (HOSPITAL_COMMUNITY): Payer: Self-pay

## 2016-06-10 ENCOUNTER — Encounter (INDEPENDENT_AMBULATORY_CARE_PROVIDER_SITE_OTHER): Payer: Self-pay

## 2016-06-10 DIAGNOSIS — Z01812 Encounter for preprocedural laboratory examination: Secondary | ICD-10-CM

## 2016-06-10 DIAGNOSIS — R001 Bradycardia, unspecified: Secondary | ICD-10-CM | POA: Diagnosis not present

## 2016-06-10 DIAGNOSIS — Z8249 Family history of ischemic heart disease and other diseases of the circulatory system: Secondary | ICD-10-CM | POA: Diagnosis not present

## 2016-06-10 DIAGNOSIS — K219 Gastro-esophageal reflux disease without esophagitis: Secondary | ICD-10-CM | POA: Diagnosis not present

## 2016-06-10 DIAGNOSIS — Z841 Family history of disorders of kidney and ureter: Secondary | ICD-10-CM | POA: Diagnosis not present

## 2016-06-10 DIAGNOSIS — I4510 Unspecified right bundle-branch block: Secondary | ICD-10-CM | POA: Diagnosis not present

## 2016-06-10 DIAGNOSIS — M1990 Unspecified osteoarthritis, unspecified site: Secondary | ICD-10-CM | POA: Diagnosis not present

## 2016-06-10 DIAGNOSIS — K8064 Calculus of gallbladder and bile duct with chronic cholecystitis without obstruction: Secondary | ICD-10-CM | POA: Diagnosis present

## 2016-06-10 DIAGNOSIS — I739 Peripheral vascular disease, unspecified: Secondary | ICD-10-CM | POA: Diagnosis not present

## 2016-06-10 DIAGNOSIS — G4733 Obstructive sleep apnea (adult) (pediatric): Secondary | ICD-10-CM | POA: Diagnosis not present

## 2016-06-10 DIAGNOSIS — G1223 Primary lateral sclerosis: Secondary | ICD-10-CM | POA: Diagnosis not present

## 2016-06-10 DIAGNOSIS — Z823 Family history of stroke: Secondary | ICD-10-CM | POA: Diagnosis not present

## 2016-06-10 DIAGNOSIS — E785 Hyperlipidemia, unspecified: Secondary | ICD-10-CM | POA: Diagnosis not present

## 2016-06-10 DIAGNOSIS — K8050 Calculus of bile duct without cholangitis or cholecystitis without obstruction: Secondary | ICD-10-CM

## 2016-06-10 DIAGNOSIS — Z79899 Other long term (current) drug therapy: Secondary | ICD-10-CM | POA: Diagnosis not present

## 2016-06-10 DIAGNOSIS — Z800 Family history of malignant neoplasm of digestive organs: Secondary | ICD-10-CM | POA: Diagnosis not present

## 2016-06-10 DIAGNOSIS — G709 Myoneural disorder, unspecified: Secondary | ICD-10-CM | POA: Diagnosis not present

## 2016-06-10 DIAGNOSIS — Z7982 Long term (current) use of aspirin: Secondary | ICD-10-CM | POA: Diagnosis not present

## 2016-06-10 DIAGNOSIS — Z8042 Family history of malignant neoplasm of prostate: Secondary | ICD-10-CM | POA: Diagnosis not present

## 2016-06-10 DIAGNOSIS — K760 Fatty (change of) liver, not elsewhere classified: Secondary | ICD-10-CM | POA: Diagnosis not present

## 2016-06-10 DIAGNOSIS — I10 Essential (primary) hypertension: Secondary | ICD-10-CM | POA: Diagnosis not present

## 2016-06-10 DIAGNOSIS — Z8049 Family history of malignant neoplasm of other genital organs: Secondary | ICD-10-CM | POA: Diagnosis not present

## 2016-06-10 DIAGNOSIS — Z888 Allergy status to other drugs, medicaments and biological substances status: Secondary | ICD-10-CM | POA: Diagnosis not present

## 2016-06-10 HISTORY — DX: Myoneural disorder, unspecified: G70.9

## 2016-06-10 HISTORY — DX: Nausea with vomiting, unspecified: R11.2

## 2016-06-10 HISTORY — DX: Other specified postprocedural states: Z98.890

## 2016-06-10 LAB — CBC WITH DIFFERENTIAL/PLATELET
BASOS PCT: 1 %
Basophils Absolute: 0.1 10*3/uL (ref 0.0–0.1)
EOS ABS: 0.6 10*3/uL (ref 0.0–0.7)
Eosinophils Relative: 7 %
HCT: 44.5 % (ref 39.0–52.0)
HEMOGLOBIN: 15.4 g/dL (ref 13.0–17.0)
Lymphocytes Relative: 26 %
Lymphs Abs: 2.2 10*3/uL (ref 0.7–4.0)
MCH: 32.2 pg (ref 26.0–34.0)
MCHC: 34.6 g/dL (ref 30.0–36.0)
MCV: 93.1 fL (ref 78.0–100.0)
MONOS PCT: 9 %
Monocytes Absolute: 0.8 10*3/uL (ref 0.1–1.0)
NEUTROS PCT: 57 %
Neutro Abs: 5 10*3/uL (ref 1.7–7.7)
PLATELETS: 216 10*3/uL (ref 150–400)
RBC: 4.78 MIL/uL (ref 4.22–5.81)
RDW: 13.9 % (ref 11.5–15.5)
WBC: 8.7 10*3/uL (ref 4.0–10.5)

## 2016-06-10 LAB — COMPREHENSIVE METABOLIC PANEL
ALBUMIN: 4.3 g/dL (ref 3.5–5.0)
ALK PHOS: 48 U/L (ref 38–126)
ALT: 32 U/L (ref 17–63)
ANION GAP: 9 (ref 5–15)
AST: 31 U/L (ref 15–41)
BUN: 21 mg/dL — ABNORMAL HIGH (ref 6–20)
CALCIUM: 10.1 mg/dL (ref 8.9–10.3)
CHLORIDE: 104 mmol/L (ref 101–111)
CO2: 26 mmol/L (ref 22–32)
Creatinine, Ser: 1.25 mg/dL — ABNORMAL HIGH (ref 0.61–1.24)
GFR calc Af Amer: >60 mL/min (ref >60)
GFR calc non Af Amer: 56 mL/min — ABNORMAL LOW (ref >60)
GLUCOSE: 106 mg/dL — AB (ref 65–99)
Potassium: 4.4 mmol/L (ref 3.5–5.1)
SODIUM: 139 mmol/L (ref 135–145)
Total Bilirubin: 0.6 mg/dL (ref 0.3–1.2)
Total Protein: 7.6 g/dL (ref 6.5–8.1)

## 2016-06-10 NOTE — Progress Notes (Signed)
Echo 08/16/15 EKG 07/29/15  In epic

## 2016-06-12 NOTE — Progress Notes (Signed)
Patient called and informed of time change for surgery on Thursday 06/13/2016. Instructed patient to arrive at Short Stay at [facility] at [place] am and nothing to eat or drink after midnight! Patient vwerbalized understanding.

## 2016-06-13 ENCOUNTER — Ambulatory Visit (HOSPITAL_COMMUNITY): Payer: Medicare Other | Admitting: Certified Registered Nurse Anesthetist

## 2016-06-13 ENCOUNTER — Encounter (HOSPITAL_COMMUNITY): Payer: Self-pay | Admitting: *Deleted

## 2016-06-13 ENCOUNTER — Observation Stay (HOSPITAL_COMMUNITY)
Admission: RE | Admit: 2016-06-13 | Discharge: 2016-06-13 | Disposition: A | Discharge status: Home / Self Care | Payer: Medicare Other | Source: Ambulatory Visit | Attending: General Surgery | Admitting: General Surgery

## 2016-06-13 ENCOUNTER — Encounter (HOSPITAL_COMMUNITY)
Admission: RE | Disposition: A | Discharge status: Home / Self Care | Payer: Self-pay | Source: Ambulatory Visit | Attending: General Surgery

## 2016-06-13 DIAGNOSIS — Z79899 Other long term (current) drug therapy: Secondary | ICD-10-CM | POA: Insufficient documentation

## 2016-06-13 DIAGNOSIS — K8064 Calculus of gallbladder and bile duct with chronic cholecystitis without obstruction: Principal | ICD-10-CM | POA: Insufficient documentation

## 2016-06-13 DIAGNOSIS — K760 Fatty (change of) liver, not elsewhere classified: Secondary | ICD-10-CM | POA: Diagnosis not present

## 2016-06-13 DIAGNOSIS — Z800 Family history of malignant neoplasm of digestive organs: Secondary | ICD-10-CM | POA: Insufficient documentation

## 2016-06-13 DIAGNOSIS — Z8049 Family history of malignant neoplasm of other genital organs: Secondary | ICD-10-CM | POA: Insufficient documentation

## 2016-06-13 DIAGNOSIS — Z7982 Long term (current) use of aspirin: Secondary | ICD-10-CM | POA: Insufficient documentation

## 2016-06-13 DIAGNOSIS — I10 Essential (primary) hypertension: Secondary | ICD-10-CM | POA: Insufficient documentation

## 2016-06-13 DIAGNOSIS — R001 Bradycardia, unspecified: Secondary | ICD-10-CM | POA: Diagnosis not present

## 2016-06-13 DIAGNOSIS — Z888 Allergy status to other drugs, medicaments and biological substances status: Secondary | ICD-10-CM | POA: Insufficient documentation

## 2016-06-13 DIAGNOSIS — I739 Peripheral vascular disease, unspecified: Secondary | ICD-10-CM | POA: Insufficient documentation

## 2016-06-13 DIAGNOSIS — K219 Gastro-esophageal reflux disease without esophagitis: Secondary | ICD-10-CM | POA: Insufficient documentation

## 2016-06-13 DIAGNOSIS — Z8042 Family history of malignant neoplasm of prostate: Secondary | ICD-10-CM | POA: Insufficient documentation

## 2016-06-13 DIAGNOSIS — K810 Acute cholecystitis: Secondary | ICD-10-CM | POA: Diagnosis present

## 2016-06-13 DIAGNOSIS — G4733 Obstructive sleep apnea (adult) (pediatric): Secondary | ICD-10-CM | POA: Insufficient documentation

## 2016-06-13 DIAGNOSIS — G709 Myoneural disorder, unspecified: Secondary | ICD-10-CM | POA: Insufficient documentation

## 2016-06-13 DIAGNOSIS — I4510 Unspecified right bundle-branch block: Secondary | ICD-10-CM | POA: Insufficient documentation

## 2016-06-13 DIAGNOSIS — M1990 Unspecified osteoarthritis, unspecified site: Secondary | ICD-10-CM | POA: Diagnosis not present

## 2016-06-13 DIAGNOSIS — E785 Hyperlipidemia, unspecified: Secondary | ICD-10-CM | POA: Insufficient documentation

## 2016-06-13 DIAGNOSIS — Z8249 Family history of ischemic heart disease and other diseases of the circulatory system: Secondary | ICD-10-CM | POA: Insufficient documentation

## 2016-06-13 DIAGNOSIS — Z823 Family history of stroke: Secondary | ICD-10-CM | POA: Insufficient documentation

## 2016-06-13 DIAGNOSIS — Z841 Family history of disorders of kidney and ureter: Secondary | ICD-10-CM | POA: Insufficient documentation

## 2016-06-13 DIAGNOSIS — G1223 Primary lateral sclerosis: Secondary | ICD-10-CM | POA: Insufficient documentation

## 2016-06-13 HISTORY — PX: CHOLECYSTECTOMY: SHX55

## 2016-06-13 SURGERY — LAPAROSCOPIC CHOLECYSTECTOMY
Anesthesia: General

## 2016-06-13 MED ORDER — ONDANSETRON HCL 4 MG/2ML IJ SOLN
INTRAMUSCULAR | Status: DC | PRN
  Administered 2016-06-13: 4 mg via INTRAVENOUS

## 2016-06-13 MED ORDER — PHENYLEPHRINE 40 MCG/ML (10ML) SYRINGE FOR IV PUSH (FOR BLOOD PRESSURE SUPPORT)
PREFILLED_SYRINGE | INTRAVENOUS | Status: AC
  Filled 2016-06-13: qty 10

## 2016-06-13 MED ORDER — DIPHENHYDRAMINE HCL 50 MG/ML IJ SOLN
INTRAMUSCULAR | Status: DC | PRN
  Administered 2016-06-13: 12.5 mg via INTRAVENOUS

## 2016-06-13 MED ORDER — ACETAMINOPHEN 650 MG RE SUPP: 650.0000 mg | Freq: Four times a day (QID) | RECTAL | Status: DC | PRN

## 2016-06-13 MED ORDER — LIDOCAINE 2% (20 MG/ML) 5 ML SYRINGE
INTRAMUSCULAR | Status: AC
  Filled 2016-06-13: qty 10

## 2016-06-13 MED ORDER — LACTATED RINGERS IR SOLN
Status: DC | PRN
  Administered 2016-06-13: 1000 mL

## 2016-06-13 MED ORDER — DIPHENHYDRAMINE HCL 50 MG/ML IJ SOLN
INTRAMUSCULAR | Status: AC
  Filled 2016-06-13: qty 1

## 2016-06-13 MED ORDER — DIPHENHYDRAMINE HCL 12.5 MG/5ML PO ELIX: 12.5000 mg | ORAL_SOLUTION | Freq: Four times a day (QID) | ORAL | Status: DC | PRN

## 2016-06-13 MED ORDER — FENTANYL CITRATE (PF) 100 MCG/2ML IJ SOLN
INTRAMUSCULAR | Status: AC
  Filled 2016-06-13: qty 2

## 2016-06-13 MED ORDER — BUPIVACAINE HCL (PF) 0.25 % IJ SOLN
INTRAMUSCULAR | Status: DC | PRN
  Administered 2016-06-13: 30 mL

## 2016-06-13 MED ORDER — CIPROFLOXACIN IN D5W 400 MG/200ML IV SOLN
400.0000 mg | INTRAVENOUS | Status: AC
  Administered 2016-06-13: 400 mg via INTRAVENOUS
  Filled 2016-06-13: qty 200

## 2016-06-13 MED ORDER — DEXAMETHASONE SODIUM PHOSPHATE 4 MG/ML IJ SOLN
INTRAMUSCULAR | Status: DC | PRN
  Administered 2016-06-13: 10 mg via INTRAVENOUS

## 2016-06-13 MED ORDER — HYDROMORPHONE HCL 1 MG/ML IJ SOLN
INTRAMUSCULAR | Status: DC | PRN
  Administered 2016-06-13: .4 mg via INTRAVENOUS
  Administered 2016-06-13: .2 mg via INTRAVENOUS
  Administered 2016-06-13: 0.5 mg via INTRAVENOUS
  Administered 2016-06-13: .5 mg via INTRAVENOUS
  Administered 2016-06-13 (×2): .2 mg via INTRAVENOUS

## 2016-06-13 MED ORDER — HYDROMORPHONE HCL 2 MG/ML IJ SOLN
INTRAMUSCULAR | Status: AC
  Filled 2016-06-13: qty 1

## 2016-06-13 MED ORDER — 0.9 % SODIUM CHLORIDE (POUR BTL) OPTIME
TOPICAL | Status: DC | PRN
  Administered 2016-06-13: 1000 mL

## 2016-06-13 MED ORDER — HYDROCODONE-ACETAMINOPHEN 5-325 MG PO TABS: 1.0000 | ORAL_TABLET | ORAL | Status: DC | PRN

## 2016-06-13 MED ORDER — HYDROCODONE-ACETAMINOPHEN 5-325 MG PO TABS: 1.0000 | ORAL_TABLET | Freq: Four times a day (QID) | ORAL | 0 refills | Status: DC | PRN

## 2016-06-13 MED ORDER — CELECOXIB 200 MG PO CAPS
400.0000 mg | ORAL_CAPSULE | ORAL | Status: AC
  Administered 2016-06-13: 400 mg via ORAL
  Filled 2016-06-13: qty 2

## 2016-06-13 MED ORDER — SUGAMMADEX SODIUM 200 MG/2ML IV SOLN
INTRAVENOUS | Status: DC | PRN
  Administered 2016-06-13 (×2): 100 mg via INTRAVENOUS
  Administered 2016-06-13: 300 mg via INTRAVENOUS

## 2016-06-13 MED ORDER — DEXAMETHASONE SODIUM PHOSPHATE 10 MG/ML IJ SOLN
INTRAMUSCULAR | Status: AC
  Filled 2016-06-13: qty 1

## 2016-06-13 MED ORDER — LACTATED RINGERS IV SOLN
INTRAVENOUS | Status: DC
  Administered 2016-06-13: 07:00:00 via INTRAVENOUS

## 2016-06-13 MED ORDER — MORPHINE SULFATE (PF) 4 MG/ML IV SOLN: 2.0000 mg | INTRAVENOUS | Status: DC | PRN

## 2016-06-13 MED ORDER — ONDANSETRON HCL 4 MG/2ML IJ SOLN
INTRAMUSCULAR | Status: AC
  Filled 2016-06-13: qty 2

## 2016-06-13 MED ORDER — POLYETHYLENE GLYCOL 3350 17 G PO PACK: 17.0000 g | PACK | Freq: Every day | ORAL | Status: DC | PRN

## 2016-06-13 MED ORDER — PROPOFOL 10 MG/ML IV BOLUS
INTRAVENOUS | Status: DC | PRN
  Administered 2016-06-13: 120 mg via INTRAVENOUS

## 2016-06-13 MED ORDER — ROCURONIUM BROMIDE 50 MG/5ML IV SOSY
PREFILLED_SYRINGE | INTRAVENOUS | Status: AC
  Filled 2016-06-13: qty 5

## 2016-06-13 MED ORDER — PHENYLEPHRINE 40 MCG/ML (10ML) SYRINGE FOR IV PUSH (FOR BLOOD PRESSURE SUPPORT)
PREFILLED_SYRINGE | INTRAVENOUS | Status: DC | PRN
  Administered 2016-06-13 (×2): 40 ug via INTRAVENOUS
  Administered 2016-06-13: 80 ug via INTRAVENOUS
  Administered 2016-06-13: 120 ug via INTRAVENOUS

## 2016-06-13 MED ORDER — ACETAMINOPHEN 500 MG PO TABS
1000.0000 mg | ORAL_TABLET | ORAL | Status: AC
Start: 2016-06-13 — End: 2016-06-13
  Administered 2016-06-13: 1000 mg via ORAL
  Filled 2016-06-13: qty 2

## 2016-06-13 MED ORDER — CIPROFLOXACIN IN D5W 400 MG/200ML IV SOLN
INTRAVENOUS | Status: AC
  Filled 2016-06-13: qty 200

## 2016-06-13 MED ORDER — SODIUM CHLORIDE 0.45 % IV SOLN
INTRAVENOUS | Status: DC
  Administered 2016-06-13: 12:00:00 via INTRAVENOUS

## 2016-06-13 MED ORDER — PROPOFOL 10 MG/ML IV BOLUS
INTRAVENOUS | Status: AC
  Filled 2016-06-13: qty 20

## 2016-06-13 MED ORDER — LIDOCAINE 2% (20 MG/ML) 5 ML SYRINGE
INTRAMUSCULAR | Status: DC | PRN
  Administered 2016-06-13: 1.5 mg/kg/h via INTRAVENOUS

## 2016-06-13 MED ORDER — PROMETHAZINE HCL 25 MG/ML IJ SOLN: 6.2500 mg | INTRAMUSCULAR | Status: DC | PRN

## 2016-06-13 MED ORDER — ONDANSETRON HCL 4 MG/2ML IJ SOLN: 4.0000 mg | Freq: Four times a day (QID) | INTRAMUSCULAR | Status: DC | PRN

## 2016-06-13 MED ORDER — DIPHENHYDRAMINE HCL 50 MG/ML IJ SOLN: 12.5000 mg | Freq: Four times a day (QID) | INTRAMUSCULAR | Status: DC | PRN

## 2016-06-13 MED ORDER — ONDANSETRON 4 MG PO TBDP: 4.0000 mg | ORAL_TABLET | Freq: Four times a day (QID) | ORAL | Status: DC | PRN

## 2016-06-13 MED ORDER — ACETAMINOPHEN 325 MG PO TABS: 650.0000 mg | ORAL_TABLET | Freq: Four times a day (QID) | ORAL | Status: DC | PRN

## 2016-06-13 MED ORDER — BUPIVACAINE HCL (PF) 0.25 % IJ SOLN
INTRAMUSCULAR | Status: AC
  Filled 2016-06-13: qty 30

## 2016-06-13 MED ORDER — SODIUM CHLORIDE 0.9 % IJ SOLN
INTRAMUSCULAR | Status: AC
  Filled 2016-06-13: qty 10

## 2016-06-13 MED ORDER — FENTANYL CITRATE (PF) 100 MCG/2ML IJ SOLN
INTRAMUSCULAR | Status: DC | PRN
  Administered 2016-06-13 (×2): 50 ug via INTRAVENOUS

## 2016-06-13 MED ORDER — HYDRALAZINE HCL 20 MG/ML IJ SOLN: 10.0000 mg | INTRAMUSCULAR | Status: DC | PRN

## 2016-06-13 MED ORDER — ROCURONIUM BROMIDE 50 MG/5ML IV SOSY
PREFILLED_SYRINGE | INTRAVENOUS | Status: DC | PRN
  Administered 2016-06-13: 50 mg via INTRAVENOUS

## 2016-06-13 MED ORDER — HYDROMORPHONE HCL 1 MG/ML IJ SOLN: 0.2500 mg | INTRAMUSCULAR | Status: DC | PRN

## 2016-06-13 SURGICAL SUPPLY — 42 items
APL SKNCLS STERI-STRIP NONHPOA (GAUZE/BANDAGES/DRESSINGS) ×1
APPLIER CLIP ROT 10 11.4 M/L (STAPLE)
APR CLP MED LRG 11.4X10 (STAPLE)
BAG SPEC RTRVL 10 TROC 200 (ENDOMECHANICALS) ×1
BANDAGE ADH SHEER 1  50/CT (GAUZE/BANDAGES/DRESSINGS) ×10 IMPLANT
BENZOIN TINCTURE PRP APPL 2/3 (GAUZE/BANDAGES/DRESSINGS) ×2 IMPLANT
CABLE HIGH FREQUENCY MONO STRZ (ELECTRODE) ×2 IMPLANT
CATH CHOLANG 76X19 KUMAR (CATHETERS) IMPLANT
CHLORAPREP W/TINT 26ML (MISCELLANEOUS) ×2 IMPLANT
CLIP APPLIE ROT 10 11.4 M/L (STAPLE) IMPLANT
CLIP LIGATING HEM O LOK PURPLE (MISCELLANEOUS) IMPLANT
CLIP LIGATING HEMO LOK XL GOLD (MISCELLANEOUS) IMPLANT
COVER MAYO STAND STRL (DRAPES) IMPLANT
COVER SURGICAL LIGHT HANDLE (MISCELLANEOUS) ×2 IMPLANT
DECANTER SPIKE VIAL GLASS SM (MISCELLANEOUS) ×2 IMPLANT
DRAIN CHANNEL 19F RND (DRAIN) IMPLANT
DRAPE C-ARM 42X120 X-RAY (DRAPES) IMPLANT
EVACUATOR SILICONE 100CC (DRAIN) IMPLANT
GLOVE BIOGEL PI IND STRL 7.0 (GLOVE) ×1 IMPLANT
GLOVE BIOGEL PI INDICATOR 7.0 (GLOVE) ×1
GLOVE SURG SS PI 7.0 STRL IVOR (GLOVE) ×2 IMPLANT
GOWN STRL REUS W/TWL LRG LVL3 (GOWN DISPOSABLE) ×2 IMPLANT
GOWN STRL REUS W/TWL XL LVL3 (GOWN DISPOSABLE) ×4 IMPLANT
GRASPER SUT TROCAR 14GX15 (MISCELLANEOUS) ×2 IMPLANT
KIT BASIN OR (CUSTOM PROCEDURE TRAY) ×2 IMPLANT
POUCH RETRIEVAL ECOSAC 10 (ENDOMECHANICALS) ×1 IMPLANT
POUCH RETRIEVAL ECOSAC 10MM (ENDOMECHANICALS) ×1
SCISSORS LAP 5X35 DISP (ENDOMECHANICALS) ×2 IMPLANT
SET IRRIG TUBING LAPAROSCOPIC (IRRIGATION / IRRIGATOR) ×2 IMPLANT
SHEARS HARMONIC ACE PLUS 36CM (ENDOMECHANICALS) IMPLANT
SLEEVE XCEL OPT CAN 5 100 (ENDOMECHANICALS) ×4 IMPLANT
STOPCOCK 4 WAY LG BORE MALE ST (IV SETS) ×2 IMPLANT
STRIP CLOSURE SKIN 1/2X4 (GAUZE/BANDAGES/DRESSINGS) ×2 IMPLANT
SUT ETHILON 2 0 PS N (SUTURE) IMPLANT
SUT MNCRL AB 4-0 PS2 18 (SUTURE) ×2 IMPLANT
SUT VICRYL 0 ENDOLOOP (SUTURE) IMPLANT
TOWEL OR 17X26 10 PK STRL BLUE (TOWEL DISPOSABLE) ×2 IMPLANT
TOWEL OR NON WOVEN STRL DISP B (DISPOSABLE) ×2 IMPLANT
TRAY LAPAROSCOPIC (CUSTOM PROCEDURE TRAY) ×2 IMPLANT
TROCAR BLADELESS OPT 5 100 (ENDOMECHANICALS) ×2 IMPLANT
TROCAR XCEL 12X100 BLDLESS (ENDOMECHANICALS) ×2 IMPLANT
TUBING INSUF HEATED (TUBING) ×2 IMPLANT

## 2016-06-13 NOTE — Progress Notes (Signed)
Discharge instructions given to patient. Patient expresses understanding, aware of what to call MD for, how to resume medications, and care of surgical incisions. Discharge to home in private vehicle.

## 2016-06-13 NOTE — Op Note (Signed)
PATIENT:  Edward Frank  72 y.o. male  PRE-OPERATIVE DIAGNOSIS:  CHOLEDOCHOLITHIASIS  POST-OPERATIVE DIAGNOSIS:  CHOLEDOCHOLITHIASIS  PROCEDURE:  Procedure(s): LAPAROSCOPIC CHOLECYSTECTOMY   SURGEON:  Surgeon(s): Arta Bruce Neelam Tiggs, MD  ASSISTANT: none  ANESTHESIA:   local and general  Indications for procedure: [name] is a 71 y.o. male with symptoms of Abdominal pain consistent with gallbladder disease, Confirmed by Ultrasound.  Description of procedure: The patient was brought into the operative suite, placed supine. Anesthesia was administered with endotracheal tube. Patient was strapped in place and foot board was secured. All pressure points were offloaded by foam padding. The patient was prepped and draped in the usual sterile fashion.  A small incision was made to the right of the umbilicus. A 57mm trocar was inserted into the peritoneal cavity with optical entry. Pneumoperitoneum was applied with high flow low pressure. 2 48mm trocars were placed in the RUQ. A 56mm trocar was placed in the subxiphoid space. All trocars sites were first anesthesized with 0.25% marcaine with epinephrine in the subcutaneous and preperitoneal layers. Next the patient was placed in reverse trendelenberg.   The gallbladder has filmy adhesions to the omentum. These were taken down with cautery.  The gallbladder was retracted cephalad and lateral. The peritoneum was reflected off the infundibulum working lateral to medial. The cystic duct and cystic artery were identified and further dissection revealed a critical view. The cystic duct and cystic artery were doubly clipped and ligated.   The gallbladder was removed off the liver bed with cautery. The Gallbladder was placed in a specimen bag. The gallbladder fossa was irrigated and hemostasis was applied with cautery. The gallbladder was removed via the 39mm trocar. The fascial defect was closed with interrupted 0 vicryl suture via  laparoscopic trans-fascial suture passer. Pneumoperitoneum was removed, all trocar were removed. All incisions were closed with 4-0 monocryl subcuticular stitch. The patient woke from anesthesia and was brought to PACU in stable condition. All counts were correct  Findings: inflamed gallbladder  Specimen: gallbladder  Blood loss: No intake/output data recorded. ml  Local anesthesia: 30 ml [id number] marcaine  Complications: none  PLAN OF CARE: Admit for overnight observation  PATIENT DISPOSITION:  PACU - hemodynamically stable.  Gurney Maxin, M.D. General, Bariatric, & Minimally Invasive Surgery [facility]

## 2016-06-13 NOTE — Discharge Summary (Signed)
Physician Discharge Summary  Edward Frank [id number] DOB: 1946-02-27 DOA: 06/13/2016  PCP: Eliezer Lofts, MD  Admit date: 06/13/2016 Discharge date: 06/13/2016  Recommendations for Outpatient Follow-up:  1.  (include homehealth, outpatient follow-up instructions, specific recommendations for PCP to follow-up on, etc.)   Discharge Diagnoses:  Active Problems:   Acute cholecystitis   Surgical Procedure: lap chole  Discharge Condition: Good Disposition: Home  Diet recommendation: low fat diet   Hospital Course:  71 yo male with ALS underwent lap chole. POD he had good strength and was able to tolerate liquids. He was discharged home a few hours after the surgery.  Discharge Instructions  Discharge Instructions    Call MD for:  difficulty breathing, headache or visual disturbances    Complete by:  As directed    Call MD for:  hives    Complete by:  As directed    Call MD for:  persistant nausea and vomiting    Complete by:  As directed    Call MD for:  redness, tenderness, or signs of infection (pain, swelling, redness, odor or green/yellow discharge around incision site)    Complete by:  As directed    Call MD for:  severe uncontrolled pain    Complete by:  As directed    Call MD for:  temperature >100.4    Complete by:  As directed    Diet - low sodium heart healthy    Complete by:  As directed    Discharge wound care:    Complete by:  As directed    Remove bandages tomorrow. Ok to Games developer. Steristrips will likely peel off in 1-3 weeks. No bandage required   Driving Restrictions    Complete by:  As directed    No driving while on narcotics   Increase activity slowly    Complete by:  As directed    Lifting restrictions    Complete by:  As directed    No lifting greater than 20 pounds for 3 weeks     Allergies as of 06/13/2016      Reactions   Riluzole Other (See Comments)   Peripheral edema in the legs, feet, arms   Gabapentin Swelling   Peripheral edema in the legs, feet, and arms      Medication List    TAKE these medications   aspirin EC 81 MG tablet Take 81 mg by mouth daily.   chlorthalidone 50 MG tablet Commonly known as:  HYGROTON Take 1 tablet (50 mg total) by mouth daily.   CoQ10 400 MG Caps Take 400 mg by mouth daily.   CRANBERRY PO Take 1 capsule by mouth 2 (two) times daily.   desonide 0.05 % cream Commonly known as:  DESOWEN Apply 1 application topically daily as needed (psoriasis).   FISH OIL TRIPLE STRENGTH 1400 MG Caps Take 2,800 mg by mouth 2 (two) times daily.   HYDROcodone-acetaminophen 5-325 MG tablet Commonly known as:  NORCO/VICODIN Take 1-2 tablets by mouth every 6 (six) hours as needed for moderate pain.   indomethacin 25 MG capsule Commonly known as:  INDOCIN Take 25 mg by mouth daily.   losartan 50 MG tablet Commonly known as:  COZAAR Take 1 tablet (50 mg total) by mouth daily.   methocarbamol 750 MG tablet Commonly known as:  ROBAXIN Take 750 mg by mouth 2 (two) times daily.   multivitamin with minerals Tabs tablet Take 1 tablet by mouth daily.   omeprazole 20 MG capsule Commonly known  as:  PRILOSEC Take 40 mg by mouth daily.   OVER THE COUNTER MEDICATION Take 1 tablet by mouth daily. Calcium citrate+D3 500-800 mg   pravastatin 20 MG tablet Commonly known as:  PRAVACHOL Take 1 tablet (20 mg total) by mouth at bedtime.   Resveratrol 100 MG Caps Take 300 mg by mouth daily.   tiZANidine 4 MG tablet Commonly known as:  ZANAFLEX Take 4 mg by mouth at bedtime as needed for muscle spasms.   traMADol 50 MG tablet Commonly known as:  ULTRAM Take 50 mg by mouth 2 (two) times daily.   Vitamin B-12 2500 MCG Subl Place 2,500 mcg under the tongue daily.         The results of significant diagnostics from this hospitalization (including imaging, microbiology, ancillary and laboratory) are listed below for reference.    Significant Diagnostic Studies: No results  found.  Labs: Basic Metabolic Panel:  Recent Labs Lab 06/10/16 1157  NA 139  K 4.4  CL 104  CO2 26  GLUCOSE 106*  BUN 21*  CREATININE 1.25*  CALCIUM 10.1   Liver Function Tests:  Recent Labs Lab 06/10/16 1157  AST 31  ALT 32  ALKPHOS 48  BILITOT 0.6  PROT 7.6  ALBUMIN 4.3    CBC:  Recent Labs Lab 06/10/16 1157  WBC 8.7  NEUTROABS 5.0  HGB 15.4  HCT 44.5  MCV 93.1  PLT 216    CBG: No results for input(s): GLUCAP in the last 168 hours.  Active Problems:   Acute cholecystitis   Time coordinating discharge: <93min

## 2016-06-13 NOTE — H&P (Signed)
Edward Frank is an 71 y.o. male.   Chief Complaint: abdominal pain HPI: 71 yo male with multiple other chronic medical problems who presents with intermittent abdominal pain consistent with biliary colic.  Past Medical History:  Diagnosis Date  . Arthritis   . Bradycardia   . Fatty liver   . Fracture, tibia, with fibula teenage yrs.    no surgery, wore cast  . GERD (gastroesophageal reflux disease)   . Hyperlipidemia   . Hypertension   . 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 OSA (obstructive sleep apnea) 01/22/2011  . PONV (postoperative nausea and vomiting)   . Primary lateral sclerosis   . Right bundle branch block   . Syncope     Past Surgical History:  Procedure Laterality Date  . EYE SURGERY     Cataracts bil  . FOOT SURGERY  11-2008   hammer toe and bunion  . HERNIA REPAIR  12-2001   inguinal hernia bilateral  . SHOULDER ARTHROSCOPY WITH ROTATOR CUFF REPAIR AND SUBACROMIAL DECOMPRESSION Right 06/18/2012   Procedure: RIGHT SHOULDER ARTHROSCOPY WITH SUBACROMIAL DECOMPRESSION AND DISTAL CLAVICLE RESECTION AND ROTATOR CUFF REPAIR;  Surgeon: Marin Shutter, MD;  Location: [facility];  Service: Orthopedics;  Laterality: Right;  . SHOULDER SURGERY  1977   Luxating     Family History  Problem Relation Age of Onset  . Heart disease Mother     ? afib and got pacemaker  . Uterine cancer Mother   . Prostate cancer Father   . Hypertension Father   . Kidney failure Father   . Stroke Father   . Colon cancer Sister 77   Social History:  reports that he has never smoked. He has never used smokeless tobacco. He reports that he drinks alcohol. He reports that he does not use drugs.  Allergies:  Allergies  Allergen Reactions  . Riluzole Other (See Comments)    Peripheral edema in the legs, feet, arms  . Gabapentin Swelling    Peripheral edema in the legs, feet, and arms    Medications Prior to Admission  Medication Sig  Dispense Refill  . aspirin EC 81 MG tablet Take 81 mg by mouth daily.    . chlorthalidone (HYGROTON) 50 MG tablet Take 1 tablet (50 mg total) by mouth daily. 30 tablet 11  . Coenzyme Q10 (COQ10) 400 MG CAPS Take 400 mg by mouth daily.    [name] CRANBERRY PO Take 1 capsule by mouth 2 (two) times daily.     . Cyanocobalamin (VITAMIN B-12) 2500 MCG SUBL Place 2,500 mcg under the tongue daily.    [name] desonide (DESOWEN) 0.05 % cream Apply 1 application topically daily as needed (psoriasis).     . indomethacin (INDOCIN) 25 MG capsule Take 25 mg by mouth daily.     [name] losartan (COZAAR) 50 MG tablet Take 1 tablet (50 mg total) by mouth daily. 90 tablet 3  . methocarbamol (ROBAXIN) 750 MG tablet Take 750 mg by mouth 2 (two) times daily.     . Multiple Vitamin (MULTIVITAMIN WITH MINERALS) TABS Take 1 tablet by mouth daily.    . Omega-3 Fatty Acids (FISH OIL TRIPLE STRENGTH) 1400 MG CAPS Take 2,800 mg by mouth 2 (two) times daily.     [name] omeprazole (PRILOSEC) 20 MG capsule Take 40 mg by mouth daily.     [name] OVER THE COUNTER MEDICATION Take 1 tablet by mouth daily. Calcium citrate+D3 500-800 mg    .  pravastatin (PRAVACHOL) 20 MG tablet Take 1 tablet (20 mg total) by mouth at bedtime. 90 tablet 3  . RESVERATROL 100 MG CAPS Take 300 mg by mouth daily.     [name] tiZANidine (ZANAFLEX) 4 MG tablet Take 4 mg by mouth at bedtime as needed for muscle spasms.     . traMADol (ULTRAM) 50 MG tablet Take 50 mg by mouth 2 (two) times daily.       No results found for this or any previous visit (from the past 48 hour(s)). No results found.  Review of Systems  Constitutional: Negative for chills and fever.  HENT: Negative for hearing loss.   Eyes: Negative for blurred vision and double vision.  Respiratory: Negative for cough and hemoptysis.   Cardiovascular: Negative for chest pain and palpitations.  Gastrointestinal: Negative for abdominal pain, nausea and vomiting.  Genitourinary: Negative for dysuria and urgency.   Musculoskeletal: Negative for myalgias and neck pain.  Skin: Negative for itching and rash.  Neurological: Negative for dizziness, tingling and headaches.  Endo/Heme/Allergies: Does not bruise/bleed easily.  Psychiatric/Behavioral: Negative for depression and suicidal ideas.    Blood pressure 140/90, pulse 82, temperature 98.2 F (36.8 C), temperature source Oral, resp. rate 16, height 5\' 11"  (1.803 m), weight 106.6 kg (235 lb), SpO2 98 %. Physical Exam  Vitals reviewed. Constitutional: He is oriented to person, place, and time. He appears well-developed and well-nourished.  HENT:  Head: Normocephalic and atraumatic.  Eyes: Conjunctivae and EOM are normal. Pupils are equal, round, and reactive to light.  Neck: Normal range of motion. Neck supple.  Cardiovascular: Normal rate and regular rhythm.   Respiratory: Effort normal and breath sounds normal.  GI: Soft. Bowel sounds are normal. He exhibits no distension. There is no tenderness.  Musculoskeletal: Normal range of motion.  Neurological: He is alert and oriented to person, place, and time.  Skin: Skin is warm and dry.  Psychiatric: He has a normal mood and affect. His behavior is normal.     Assessment/Plan 71 yo male with chronic cholecystitis -lap chole -obs overnight due to ALS and RBBB, will check in afternoon for possible discharge  Mickeal Skinner, MD 06/13/2016, 9:11 AM

## 2016-06-13 NOTE — Anesthesia Preprocedure Evaluation (Signed)
Anesthesia Evaluation  Patient identified by MRN, date of birth, ID band Patient awake    Reviewed: Allergy & Precautions, NPO status , Patient's Chart, lab work & pertinent test results  History of Anesthesia Complications (+) PONV  Airway Mallampati: II  TM Distance: >3 FB Neck ROM: Full    Dental no notable dental hx.    Pulmonary sleep apnea ,    Pulmonary exam normal breath sounds clear to auscultation       Cardiovascular hypertension, + Peripheral Vascular Disease  Normal cardiovascular exam Rhythm:Regular Rate:Normal     Neuro/Psych ?ALS  Neuromuscular disease negative psych ROS   GI/Hepatic negative GI ROS, Neg liver ROS,   Endo/Other  negative endocrine ROS  Renal/GU negative Renal ROS  negative genitourinary   Musculoskeletal negative musculoskeletal ROS (+)   Abdominal   Peds negative pediatric ROS (+)  Hematology negative hematology ROS (+)   Anesthesia Other Findings   Reproductive/Obstetrics negative OB ROS                             Anesthesia Physical Anesthesia Plan  ASA: III  Anesthesia Plan: General   Post-op Pain Management:    Induction: Intravenous  Airway Management Planned: Oral ETT  Additional Equipment:   Intra-op Plan:   Post-operative Plan: Extubation in OR  Informed Consent: I have reviewed the patients History and Physical, chart, labs and discussed the procedure including the risks, benefits and alternatives for the proposed anesthesia with the patient or authorized representative who has indicated his/her understanding and acceptance.   Dental advisory given  Plan Discussed with: CRNA and Surgeon  Anesthesia Plan Comments:         Anesthesia Quick Evaluation

## 2016-06-13 NOTE — Anesthesia Procedure Notes (Signed)
Procedure Name: Intubation Date/Time: 06/13/2016 9:26 AM Performed by: Deliah Boston Pre-anesthesia Checklist: Patient identified, Emergency Drugs available, Suction available and Patient being monitored Patient Re-evaluated:Patient Re-evaluated prior to inductionOxygen Delivery Method: Circle system utilized Preoxygenation: Pre-oxygenation with 100% oxygen Intubation Type: IV induction Ventilation: Mask ventilation without difficulty Tube type: Oral Tube size: 7.5 mm Number of attempts: 1 Airway Equipment and Method: Stylet and Oral airway Placement Confirmation: ETT inserted through vocal cords under direct vision,  positive ETCO2 and breath sounds checked- equal and bilateral Secured at: 23 cm Tube secured with: Tape Dental Injury: Teeth and Oropharynx as per pre-operative assessment

## 2016-06-13 NOTE — Transfer of Care (Signed)
Immediate Anesthesia Transfer of Care Note  Patient: Edward Frank  Procedure(s) Performed: Procedure(s): LAPAROSCOPIC CHOLECYSTECTOMY (N/A)  Patient Location: PACU  Anesthesia Type:General  Level of Consciousness: Patient easily awoken, sedated, comfortable, cooperative, following commands, responds to stimulation.   Airway & Oxygen Therapy: Patient spontaneously breathing, ventilating well, oxygen via simple oxygen mask.  Post-op Assessment: Report given to PACU RN, vital signs reviewed and stable, moving all extremities.   Post vital signs: Reviewed and stable.  Complications: No apparent anesthesia complications Last Vitals:  Vitals:   06/13/16 0636  BP: 140/90  Pulse: 82  Resp: 16  Temp: 36.8 C    Last Pain:  Vitals:   06/13/16 0658  TempSrc:   PainSc: 4       Patients Stated Pain Goal: 3 ([id number])  Complications: No apparent anesthesia complications

## 2016-06-13 NOTE — Anesthesia Postprocedure Evaluation (Signed)
Anesthesia Post Note  Patient: Edward Frank  Procedure(s) Performed: Procedure(s) (LRB): LAPAROSCOPIC CHOLECYSTECTOMY (N/A)  Patient location during evaluation: PACU Anesthesia Type: General Level of consciousness: awake and alert Pain management: pain level controlled Vital Signs Assessment: post-procedure vital signs reviewed and stable Respiratory status: spontaneous breathing, nonlabored ventilation, respiratory function stable and patient connected to nasal cannula oxygen Cardiovascular status: blood pressure returned to baseline and stable Postop Assessment: no signs of nausea or vomiting Anesthetic complications: no       Last Vitals:  Vitals:   06/13/16 1100 06/13/16 1115  BP: (!) 141/88 (!) 135/95  Pulse: 78 76  Resp: 11 17  Temp:  37.2 C    Last Pain:  Vitals:   06/13/16 1115  TempSrc:   PainSc: 3                  [name]

## 2016-06-28 ENCOUNTER — Other Ambulatory Visit: Payer: Self-pay | Admitting: Unknown Physician Specialty

## 2016-06-28 DIAGNOSIS — I714 Abdominal aortic aneurysm, without rupture, unspecified: Secondary | ICD-10-CM

## 2016-07-17 ENCOUNTER — Ambulatory Visit
Admission: RE | Admit: 2016-07-17 | Discharge: 2016-07-17 | Disposition: A | Discharge status: Home / Self Care | Payer: Medicare Other | Source: Ambulatory Visit | Attending: Unknown Physician Specialty | Admitting: Unknown Physician Specialty

## 2016-07-17 DIAGNOSIS — I714 Abdominal aortic aneurysm, without rupture, unspecified: Secondary | ICD-10-CM

## 2016-09-23 NOTE — Anesthesia Postprocedure Evaluation (Signed)
Anesthesia Post Note  Patient: Edward Frank  Procedure(s) Performed: Procedure(s) (LRB): LAPAROSCOPIC CHOLECYSTECTOMY (N/A)     Anesthesia Post Evaluation  Last Vitals:  Vitals:   06/13/16 1400 06/13/16 1506  BP: (!) 136/97 126/84  Pulse: 88 90  Resp: 16 16  Temp: 36.9 C 36.9 C    Last Pain:  Vitals:   06/13/16 1506  TempSrc: Oral  PainSc:                  [name] S

## 2016-09-23 NOTE — Addendum Note (Signed)
Addendum  created 09/23/16 1125 by Myrtie Soman, MD   Sign clinical note

## 2016-10-21 ENCOUNTER — Other Ambulatory Visit: Payer: Self-pay | Admitting: Cardiology

## 2016-10-22 NOTE — Telephone Encounter (Signed)
Followed by [facility]

## 2016-10-24 NOTE — Progress Notes (Signed)
Cardiology Office Note   Date:  10/26/2016   ID:  Edward Frank, DOB January 29, 1946, MRN [id number]  PCP:  [name], MD  Cardiologist:   Minus Breeding, MD    Chief Complaint  Patient presents with  . Loss of Consciousness      History of Present Illness: Edward Frank is a 71 y.o. male who presents for follow up of dizziness  .  He was previously seen by Dr. Marigene Ehlers.  He has ALS and has had syncope in the past that was thought to be neurocardiogenic with micturation syncope.  He has had chronic leg edema and some dyspnea with activity.  He does get some mild orthostatic symptoms at times.  However, he has had no further syncope.  He does get DOE but he is limited by his ALS and thinks this causes his symptoms.  The patient denies any new symptoms such as chest discomfort, neck or arm discomfort. There has been no new shortness of breath, PND or orthopnea. There have been no reported palpitations, presyncope or syncope.  He has chronic leg edema.   Past Medical History:  Diagnosis Date  . Arthritis   . Bradycardia   . Fatty liver   . Fracture, tibia, with fibula teenage yrs.    no surgery, wore cast  . GERD (gastroesophageal reflux disease)   . Hyperlipidemia   . Hypertension   . 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 OSA (obstructive sleep apnea) 01/22/2011  . PONV (postoperative nausea and vomiting)   . Primary lateral sclerosis ([place])   . Right bundle branch block   . Syncope     Past Surgical History:  Procedure Laterality Date  . CHOLECYSTECTOMY N/A 06/13/2016   Procedure: LAPAROSCOPIC CHOLECYSTECTOMY;  Surgeon: Arta Bruce Kinsinger, MD;  Location: [facility];  Service: General;  Laterality: N/A;  . EYE SURGERY     Cataracts bil  . FOOT SURGERY  11-2008   hammer toe and bunion  . HERNIA REPAIR  12-2001   inguinal hernia bilateral  . SHOULDER ARTHROSCOPY WITH ROTATOR CUFF REPAIR AND SUBACROMIAL DECOMPRESSION  Right 06/18/2012   Procedure: RIGHT SHOULDER ARTHROSCOPY WITH SUBACROMIAL DECOMPRESSION AND DISTAL CLAVICLE RESECTION AND ROTATOR CUFF REPAIR;  Surgeon: Marin Shutter, MD;  Location: [facility];  Service: Orthopedics;  Laterality: Right;  . SHOULDER SURGERY  1977   Luxating      Current Outpatient Prescriptions  Medication Sig Dispense Refill  . aspirin EC 81 MG tablet Take 81 mg by mouth daily.    . chlorthalidone (HYGROTON) 50 MG tablet Take 1 tablet (50 mg total) by mouth daily. 90 tablet 3  . Coenzyme Q10 (COQ10) 400 MG CAPS Take 400 mg by mouth daily.    [name] CRANBERRY PO Take 1 capsule by mouth 2 (two) times daily.     . Cyanocobalamin (VITAMIN B-12) 2500 MCG SUBL Place 2,500 mcg under the tongue daily.    [name] desonide (DESOWEN) 0.05 % cream Apply 1 application topically daily as needed (psoriasis).     [name] HYDROcodone-acetaminophen (NORCO/VICODIN) 5-325 MG tablet Take 1-2 tablets by mouth every 6 (six) hours as needed for moderate pain. 30 tablet 0  . indomethacin (INDOCIN) 25 MG capsule Take 25 mg by mouth daily.     [name] losartan (COZAAR) 50 MG tablet Take 1 tablet (50 mg total) by mouth daily. 90 tablet 3  . methocarbamol (ROBAXIN) 750 MG tablet Take 750 mg  by mouth 2 (two) times daily.     . Multiple Vitamin (MULTIVITAMIN WITH MINERALS) TABS Take 1 tablet by mouth daily.    . Omega-3 Fatty Acids (FISH OIL TRIPLE STRENGTH) 1400 MG CAPS Take 2,800 mg by mouth 2 (two) times daily.     [name] omeprazole (PRILOSEC) 20 MG capsule Take 40 mg by mouth daily.     [name] OVER THE COUNTER MEDICATION Take 1 tablet by mouth daily. Calcium citrate+D3 500-800 mg    . pravastatin (PRAVACHOL) 20 MG tablet Take 1 tablet (20 mg total) by mouth at bedtime. 90 tablet 3  . RESVERATROL 100 MG CAPS Take 300 mg by mouth daily.     [name] tiZANidine (ZANAFLEX) 4 MG tablet Take 4 mg by mouth at bedtime as needed for muscle spasms.     . traMADol (ULTRAM) 50 MG tablet Take 50 mg by mouth 2 (two) times daily.      No current  facility-administered medications for this visit.     Allergies:   Riluzole and Gabapentin    ROS:  Please see the history of present illness.   Otherwise, review of systems are positive for none.   All other systems are reviewed and negative.    PHYSICAL EXAM: VS:  BP 133/86   Pulse 60   Ht 5\' 11"  (1.803 m)   Wt 244 lb (110.7 kg)   BMI 34.03 kg/m  , BMI Body mass index is 34.03 kg/m. GENERAL:  Well appearing HEENT:  Pupils equal round and reactive, fundi not visualized, oral mucosa unremarkable NECK:  No jugular venous distention, waveform within normal limits, carotid upstroke brisk and symmetric, no bruits, no thyromegaly LYMPHATICS:  No cervical, inguinal adenopathy LUNGS:  Clear to auscultation bilaterally BACK:  No CVA tenderness CHEST:  Unremarkable HEART:  PMI not displaced or sustained,S1 and S2 within normal limits, no S3, no S4, no clicks, no rubs, no murmurs ABD:  Flat, positive bowel sounds normal in frequency in pitch, no bruits, no rebound, no guarding, no midline pulsatile mass, no hepatomegaly, no splenomegaly EXT:  2 plus pulses throughout, mild bilateral edema, no cyanosis no clubbing SKIN:  No rashes no nodules NEURO:  Diffuse muscle weakness.  PSYCH:  Cognitively intact, oriented to person place and time    EKG:  EKG is ordered today. The ekg ordered today demonstrates Sinus rhythm, rate 60, slightly unusual P-wave axis, right bundle branch block, no acute ST-T wave changes.     Recent Labs: 06/10/2016: ALT 32; BUN 21; Creatinine, Ser 1.25; Hemoglobin 15.4; Platelets 216; Potassium 4.4; Sodium 139    Lipid Panel    Component Value Date/Time   CHOL 159 11/17/2015 0922   TRIG 147.0 11/17/2015 0922   HDL 41.00 11/17/2015 0922   CHOLHDL 4 11/17/2015 0922   VLDL 29.4 11/17/2015 0922   LDLCALC 89 11/17/2015 0922   LDLDIRECT 125.8 10/26/2009 0830      Wt Readings from Last 3 Encounters:  10/25/16 244 lb (110.7 kg)  06/13/16 235 lb (106.6 kg)    06/10/16 235 lb (106.6 kg)      Other studies Reviewed: Additional studies/ records that were reviewed today include: None. Review of the above records demonstrates:  Please see elsewhere in the note.     ASSESSMENT AND PLAN:  SYNCOPE:  He has had no further syncope. He understands precautions on standing and symptoms to be aware of. No change in therapy is indicated.  EDEMA:  This is chronic. It is stable. No change  in medications is indicated.  HTN:  The blood pressure is at target. No change in medications is indicated. We will continue with therapeutic lifestyle changes (TLC).  AAA:  3.9 cm on ultrasound 3/18.    Current medicines are reviewed at length with the patient today.  The patient does not have concerns regarding medicines.  The following changes have been made:  no change  Labs/ tests ordered today include: None  Orders Placed This Encounter  Procedures  . EKG 12-Lead     Disposition:   FU with me in one year.     Signed, Minus Breeding, MD  10/26/2016 7:47 PM    [facility]

## 2016-10-25 ENCOUNTER — Ambulatory Visit (INDEPENDENT_AMBULATORY_CARE_PROVIDER_SITE_OTHER): Payer: Medicare Other | Admitting: Cardiology

## 2016-10-25 ENCOUNTER — Encounter: Payer: Self-pay | Admitting: Cardiology

## 2016-10-25 VITALS — BP 133/86 | HR 60 | Ht 71.0 in | Wt 244.0 lb

## 2016-10-25 DIAGNOSIS — I4510 Unspecified right bundle-branch block: Secondary | ICD-10-CM | POA: Diagnosis not present

## 2016-10-25 MED ORDER — LOSARTAN POTASSIUM 50 MG PO TABS: 50.0000 mg | ORAL_TABLET | Freq: Every day | ORAL | 3 refills | Status: DC

## 2016-10-25 MED ORDER — CHLORTHALIDONE 50 MG PO TABS: 50.0000 mg | ORAL_TABLET | Freq: Every day | ORAL | 3 refills | Status: DC

## 2016-10-25 MED ORDER — PRAVASTATIN SODIUM 20 MG PO TABS: 20.0000 mg | ORAL_TABLET | Freq: Every day | ORAL | 3 refills | Status: DC

## 2016-10-25 NOTE — Patient Instructions (Signed)
Medication Instructions:  Your physician recommends that you continue on your current medications as directed. Please refer to the Current Medication list given to you today.   Labwork: NONE  Testing/Procedures: NONE  Follow-Up: Your physician wants you to follow-up in: 12 MONTHS WITH DR. HOCHREIN. You will receive a reminder letter in the mail two months in advance. If you don't receive a letter, please call our office to schedule the follow-up appointment.   Any Other Special Instructions Will Be Listed Below (If Applicable).     If you need a refill on your cardiac medications before your next appointment, please call your pharmacy.   

## 2016-10-26 ENCOUNTER — Encounter: Payer: Self-pay | Admitting: Cardiology

## 2016-11-18 ENCOUNTER — Other Ambulatory Visit (INDEPENDENT_AMBULATORY_CARE_PROVIDER_SITE_OTHER): Payer: Medicare Other

## 2016-11-18 ENCOUNTER — Telehealth: Payer: Self-pay | Admitting: Family Medicine

## 2016-11-18 DIAGNOSIS — Z125 Encounter for screening for malignant neoplasm of prostate: Secondary | ICD-10-CM

## 2016-11-18 DIAGNOSIS — E7800 Pure hypercholesterolemia, unspecified: Secondary | ICD-10-CM | POA: Diagnosis not present

## 2016-11-18 LAB — COMPREHENSIVE METABOLIC PANEL
ALT: 28 U/L (ref 0–53)
AST: 26 U/L (ref 0–37)
Albumin: 4.4 g/dL (ref 3.5–5.2)
Alkaline Phosphatase: 42 U/L (ref 39–117)
BUN: 21 mg/dL (ref 6–23)
CALCIUM: 9.9 mg/dL (ref 8.4–10.5)
CO2: 30 meq/L (ref 19–32)
CREATININE: 1.14 mg/dL (ref 0.40–1.50)
Chloride: 102 mEq/L (ref 96–112)
GFR: 66.95 mL/min (ref >60.00)
GLUCOSE: 103 mg/dL — AB (ref 70–99)
Potassium: 4.3 mEq/L (ref 3.5–5.1)
Sodium: 139 mEq/L (ref 135–145)
Total Bilirubin: 0.5 mg/dL (ref 0.2–1.2)
Total Protein: 6.7 g/dL (ref 6.0–8.3)

## 2016-11-18 LAB — LIPID PANEL
CHOL/HDL RATIO: 4
Cholesterol: 161 mg/dL (ref 0–200)
HDL: 40.2 mg/dL (ref >39.00)
LDL CALC: 85 mg/dL (ref 0–99)
NonHDL: 120.83
Triglycerides: 178 mg/dL — ABNORMAL HIGH (ref 0.0–149.0)
VLDL: 35.6 mg/dL (ref 0.0–40.0)

## 2016-11-18 LAB — PSA, MEDICARE: PSA: 1.93 ng/mL (ref 0.10–4.00)

## 2016-11-18 NOTE — Telephone Encounter (Signed)
-----   Message from Ellamae Sia sent at 11/06/2016  4:14 PM EDT ----- Regarding: Lab orders for Monday, 7.30.18 Patient is scheduled for CPX labs, please order future labs, Thanks , Karna Christmas

## 2016-11-22 ENCOUNTER — Encounter: Payer: Self-pay | Admitting: Family Medicine

## 2016-11-22 ENCOUNTER — Ambulatory Visit (INDEPENDENT_AMBULATORY_CARE_PROVIDER_SITE_OTHER): Payer: Medicare Other | Admitting: Family Medicine

## 2016-11-22 ENCOUNTER — Telehealth: Payer: Self-pay | Admitting: Family Medicine

## 2016-11-22 VITALS — BP 110/70 | HR 68 | Temp 97.6°F | Ht 70.25 in | Wt 247.5 lb

## 2016-11-22 DIAGNOSIS — K640 First degree hemorrhoids: Secondary | ICD-10-CM | POA: Diagnosis not present

## 2016-11-22 DIAGNOSIS — Z0000 Encounter for general adult medical examination without abnormal findings: Secondary | ICD-10-CM | POA: Diagnosis not present

## 2016-11-22 DIAGNOSIS — E7800 Pure hypercholesterolemia, unspecified: Secondary | ICD-10-CM | POA: Diagnosis not present

## 2016-11-22 DIAGNOSIS — I714 Abdominal aortic aneurysm, without rupture, unspecified: Secondary | ICD-10-CM

## 2016-11-22 DIAGNOSIS — G1221 Amyotrophic lateral sclerosis: Secondary | ICD-10-CM | POA: Diagnosis not present

## 2016-11-22 DIAGNOSIS — M79672 Pain in left foot: Secondary | ICD-10-CM

## 2016-11-22 DIAGNOSIS — G1229 Other motor neuron disease: Secondary | ICD-10-CM

## 2016-11-22 DIAGNOSIS — I10 Essential (primary) hypertension: Secondary | ICD-10-CM | POA: Diagnosis not present

## 2016-11-22 MED ORDER — HYDROCORTISONE 2.5 % RE CREA: 1.0000 "application " | TOPICAL_CREAM | Freq: Two times a day (BID) | RECTAL | 0 refills | Status: DC

## 2016-11-22 NOTE — Assessment & Plan Note (Addendum)
Followed by Neuro at [facility].. Dr.Bedlac.

## 2016-11-22 NOTE — Assessment & Plan Note (Signed)
Stable at last [place] 3/18.

## 2016-11-22 NOTE — Assessment & Plan Note (Signed)
Due to foot deformity and abnormal pressure on 1st mcp joint.  Refer to podiatry for treatment options.

## 2016-11-22 NOTE — Telephone Encounter (Signed)
Tanya,Dr.Turner's office, returned Edward Frank's call. Lavella Lemons said patient's last eye exam was 06/25/16.

## 2016-11-22 NOTE — Progress Notes (Signed)
Subjective:    Patient ID: Edward Frank, male    DOB: Oct 20, 1945, 71 y.o.   MRN: [id number]  HPI   The patient presents for annual medicare wellness, complete physical and review of chronic health problems.  He/She also has the following acute concerns today: Hemmorhoid pain, constipation as well pain in left central anterior sole of foot  I have personally reviewed the Medicare Annual Wellness questionnaire and have noted 1. [name] medical and social history 2. Their use of alcohol, tobacco or illicit drugs 3. Their current medications and supplements 4. [name] functional ability including ADL's, fall risks, home safety risks and hearing or visual             impairment. 5. Diet and physical activities 6. Evidence for depression or mood disorders 7.         Updated provider list Cognitive evaluation was performed and recorded on pt medicare questionnaire form. The patients weight, height, BMI and visual acuity have been recorded in the chart  I have made referrals, counseling and provided education to the patient based review of the above and I have provided the pt with a written personalized care plan for preventive services.   Documentation of this information was scanned into the electronic record under the media tab.   Sleep apnea:  Resolved with weight loss followed by Dr.Sood. No longer requiring CPAP  Hypertension:   good control on losartan and chlorthalidone  Using medication without problems or lightheadedness:  none Chest pain with exertion: Edema: occ Short of breath: stable Average home BPs: stable control Other issues:  Elevated Cholesterol:  LDL at goal on pravastatin Lab Results  Component Value Date   CHOL 161 11/18/2016   HDL 40.20 11/18/2016   LDLCALC 85 11/18/2016   LDLDIRECT 125.8 10/26/2009   TRIG 178.0 (H) 11/18/2016   CHOLHDL 4 11/18/2016  Using medications without problems: Muscle aches:  Diet compliance: good. Has been  eating more fried okra Exercise: minimal given balance issues Other complaints: prediabetes   Followed by cardiologist for neurogenic syncope , RBBB and bradycardia. Dr. Percival Spanish  PLS/upper motor dominant AS: Continue slow progression change.  Was followed at [place]. Now followed by Dr. Delford Field He has walker and cane.. Has not been using much. Pt doing fairly well.  Falling 1-2 times a month.  Moves slowly to avoid.  Diagnosis of fusiform  Terminal AAA, . Incidental finding at [facility]    3/18: 3.9 cm, stable Per pt , he was told no further follow up needed given type of AAA and his age at diagnosis.   Pain  In left anterior sole of foot when barefoot. Better wearing boots. Worse in last several months.  Has callus there.  No redness.  Constipated off and on. Metamucil. He has been having some bleeding and itching . Constant in last month.  Prep H helps temporarily.   Social History /Family History/Past Medical History reviewed in detail and updated in EMR if needed.  Advance directives and end of life planning reviewed in detail with patient and documented in EMR. Patient given handout on advance care directives if needed. HCPOA and living will updated if needed. Fall, depression, hearing and vision screening reviewed and updated. Blood pressure 110/70, pulse 68, temperature 97.6 F (36.4 C), temperature source Oral, height 5' 10.25" (1.784 m), weight 247 lb 8 oz (112.3 kg), SpO2 92 %.  Review of Systems  Constitutional: Negative for fatigue and fever.  HENT: Negative for ear pain.  Eyes: Negative for pain.  Respiratory: Negative for cough and shortness of breath.   Cardiovascular: Negative for chest pain, palpitations and leg swelling.  Gastrointestinal: Positive for blood in stool and constipation. Negative for abdominal pain.  Genitourinary: Negative for dysuria.  Musculoskeletal: Negative for arthralgias.  Neurological: Negative for syncope, light-headedness and headaches.    Psychiatric/Behavioral: Negative for dysphoric mood.           Objective:   Physical Exam  Constitutional: Vital signs are normal. He appears well-developed and well-nourished.  Overweight male in NAD  HENT:  Head: Normocephalic.  Right Ear: Hearing normal.  Left Ear: Hearing normal.  Nose: Nose normal.  Mouth/Throat: Oropharynx is clear and moist and mucous membranes are normal.  Neck: Trachea normal. Carotid bruit is not present. No thyroid mass and no thyromegaly present.  Cardiovascular: Normal rate, regular rhythm and normal pulses.  Exam reveals no gallop, no distant heart sounds and no friction rub.   No murmur heard. No peripheral edema at this time, mild chronic venous stasis changes.  Pulmonary/Chest: Effort normal and breath sounds normal. No respiratory distress. He has no decreased breath sounds. He has no wheezes. He has no rhonchi. He has no rales.  Abdominal: Normal appearance and bowel sounds are normal. There is no hepatosplenomegaly. There is no tenderness. There is no CVA tenderness. No hernia.  Genitourinary: Rectal exam shows internal hemorrhoid, tenderness and guaiac positive stool. Rectal exam shows no external hemorrhoid, no fissure, no mass and anal tone normal.  Genitourinary Comments: Anoscopy: performed showing non-oozing internal hemorrhoids grade 1, no complications to procedure  Skin: Skin is warm, dry and intact. No rash noted.  Psychiatric: He has a normal mood and affect. His speech is normal and behavior is normal. Thought content normal.          Assessment & Plan:  [name] preventative maintenance and recommended screening tests for an annual wellness exam were reviewed in full today. Brought up to date unless services declined.  Counselled on the importance of diet, exercise, and its role in overall health and mortality. [name] FH and SH was reviewed, including their home life, tobacco status, and drug and alcohol status.    Vaccines: Due for TDap,  Uptodate Pneumvax and prevnar and shingles. Prostate: Father with prostate cancer age 47s. Discussed in detail, have chose to check PSA and rectal exam yearly. Lab Results  Component Value Date   PSA 1.93 11/18/2016   PSA 1.83 11/17/2015   PSA 1.76 11/10/2014  Colon: Sister with colon cancer. Had 10/12/13 at [facility], repeat in 5 years.  nonsmoker

## 2016-11-22 NOTE — Patient Instructions (Addendum)
Look into getting tetanus at [facility].  Please stop at the front for a referral to podiatry  Work on increasing fiber and water in diet.  Miralax OTC  Daily until BM are regular.  Apply rectal treatment as directed.

## 2016-11-22 NOTE — Assessment & Plan Note (Signed)
Treat constipation with miralax, increased fiber and water. Treat with topical steroid cream. If not improving consider referral to GI.

## 2016-11-22 NOTE — Telephone Encounter (Signed)
Noted  

## 2016-11-22 NOTE — Assessment & Plan Note (Signed)
Well controlled. Continue current medication.  

## 2016-12-12 ENCOUNTER — Ambulatory Visit: Payer: Medicare Other | Admitting: Cardiology

## 2017-01-20 ENCOUNTER — Ambulatory Visit (INDEPENDENT_AMBULATORY_CARE_PROVIDER_SITE_OTHER): Payer: Medicare Other | Admitting: Podiatry

## 2017-01-20 ENCOUNTER — Encounter: Payer: Self-pay | Admitting: Podiatry

## 2017-01-20 VITALS — BP 110/90 | HR 74

## 2017-01-20 DIAGNOSIS — Q828 Other specified congenital malformations of skin: Secondary | ICD-10-CM | POA: Diagnosis not present

## 2017-01-20 NOTE — Progress Notes (Signed)
   Subjective:    Patient ID: DEJION GRILLO, male    DOB: 1945-05-31, 71 y.o.   MRN: [id number]  HPI This patient presents today with approximately a month history of a painful skin lesion on the plantar first MPJ. There is uncomfortable with weightbearing and walking. 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  Review of Systems  All other systems reviewed and are negative.      Objective:   Physical Exam  Orientated 3 patient presents with wife and treatment room  Vascular: No calf edema or calf tenderness bilaterally DP and PT pulses 2/4 bilaterally Reflex within normal limits bilaterally  Neurological: Sensation to 10 g monofilament wire intact 5/5 bilaterally Vibratory sensation reactive bilaterally Ankle reflex reactive bilaterally  Dermatological: No open skin lesions bilaterally Nucleated keratoses plantar left first MPJ which duplicates patient's discomfort  Musculoskeletal: Patient is unsteady gait HAV right Manual motor testing dorsi flexion, plantar flexion, inversion, eversion 5/5 bilaterally       Assessment & Plan:   Assessment: Gait disturbance associated with history of a male atrophic lateral sclerosis Porokeratosis plantar left first MPJ  Plan: Debride porokeratosis without any bleeding and apply salinocaine  Appointment at patient's request

## 2017-01-21 ENCOUNTER — Ambulatory Visit (INDEPENDENT_AMBULATORY_CARE_PROVIDER_SITE_OTHER): Payer: Medicare Other | Admitting: Family Medicine

## 2017-01-21 ENCOUNTER — Encounter: Payer: Self-pay | Admitting: Family Medicine

## 2017-01-21 VITALS — BP 122/70 | HR 60 | Temp 97.7°F | Wt 244.5 lb

## 2017-01-21 DIAGNOSIS — M7022 Olecranon bursitis, left elbow: Secondary | ICD-10-CM

## 2017-01-21 DIAGNOSIS — M19012 Primary osteoarthritis, left shoulder: Secondary | ICD-10-CM | POA: Insufficient documentation

## 2017-01-21 DIAGNOSIS — Z23 Encounter for immunization: Secondary | ICD-10-CM | POA: Diagnosis not present

## 2017-01-21 NOTE — Assessment & Plan Note (Addendum)
Traumatic bursitis after fall 2 wks ago. Discussed aspiration vs monitoring. Will monitor for now, hopeful fluid will reabsorb. RTC 2-3 wks if no improvement for aspiration. Pt and wife agree with plan.

## 2017-01-21 NOTE — Progress Notes (Signed)
BP 122/70 (BP Location: Left Arm, Patient Position: Sitting, Cuff Size: Normal)   Pulse 60   Temp 97.7 F (36.5 C) (Oral)   Wt 244 lb 8 oz (110.9 kg)   SpO2 96%   BMI 34.83 kg/m    CC: L elbow swelling after fall  Subjective:    Patient ID: Edward Frank, male    DOB: 1945/10/19, 71 y.o.   MRN: [id number]  HPI: Edward Frank is a 71 y.o. male presenting on 01/21/2017 for Edema (in left elbow due a fall about 2 wks ago. Says area is healing slowly)   2 wk h/o L elbow swelling after fall. Lost his balance while at home on gravel driveway. He does regularly use cane. Landed on elbow. Denies fevers/chills, redness or warmth of bursa. No current pain.   Fluid has accumulated over last 2 weeks.   Relevant past medical, surgical, family and social history reviewed and updated as indicated. Interim medical history since our last visit reviewed. Allergies and medications reviewed and updated. Outpatient Medications Prior to Visit  Medication Sig Dispense Refill  . aspirin EC 81 MG tablet Take 81 mg by mouth daily.    . chlorthalidone (HYGROTON) 50 MG tablet Take 1 tablet (50 mg total) by mouth daily. 90 tablet 3  . Coenzyme Q10 (COQ10) 400 MG CAPS Take 400 mg by mouth daily.    [name] CRANBERRY PO Take 1 capsule by mouth 2 (two) times daily.     . Cyanocobalamin (VITAMIN B-12) 2500 MCG SUBL Place 2,500 mcg under the tongue daily.    [name] desonide (DESOWEN) 0.05 % cream Apply 1 application topically daily as needed (psoriasis).     . hydrocortisone (ANUSOL-HC) 2.5 % rectal cream Place 1 application rectally 2 (two) times daily. 30 g 0  . indomethacin (INDOCIN) 25 MG capsule Take 25 mg by mouth daily.     [name] losartan (COZAAR) 50 MG tablet Take 1 tablet (50 mg total) by mouth daily. 90 tablet 3  . methocarbamol (ROBAXIN) 750 MG tablet Take 750 mg by mouth 2 (two) times daily.     . Multiple Vitamin (MULTIVITAMIN WITH MINERALS) TABS Take 1 tablet by mouth daily.    . Omega-3 Fatty Acids (FISH  OIL TRIPLE STRENGTH) 1400 MG CAPS Take 2,800 mg by mouth 2 (two) times daily.     [name] omeprazole (PRILOSEC) 20 MG capsule Take 40 mg by mouth daily.     [name] OVER THE COUNTER MEDICATION Take 1 tablet by mouth daily. Calcium citrate+D3 500-800 mg    . pravastatin (PRAVACHOL) 20 MG tablet Take 1 tablet (20 mg total) by mouth at bedtime. 90 tablet 3  . RESVERATROL 100 MG CAPS Take 300 mg by mouth daily.     [name] tiZANidine (ZANAFLEX) 4 MG tablet Take 4 mg by mouth at bedtime as needed for muscle spasms.     . traMADol (ULTRAM) 50 MG tablet Take 50 mg by mouth 2 (two) times daily.     [name] HYDROcodone-acetaminophen (NORCO/VICODIN) 5-325 MG tablet Take 1-2 tablets by mouth every 6 (six) hours as needed for moderate pain. 30 tablet 0   No facility-administered medications prior to visit.      Per HPI unless specifically indicated in ROS section below Review of Systems     Objective:    BP 122/70 (BP Location: Left Arm, Patient Position: Sitting, Cuff Size: Normal)   Pulse 60   Temp 97.7 F (36.5 C) (Oral)   Wt 244 lb  8 oz (110.9 kg)   SpO2 96%   BMI 34.83 kg/m   Wt Readings from Last 3 Encounters:  01/21/17 244 lb 8 oz (110.9 kg)  11/22/16 247 lb 8 oz (112.3 kg)  10/25/16 244 lb (110.7 kg)    Physical Exam  Constitutional: He appears well-developed and well-nourished. No distress.  Musculoskeletal: He exhibits no edema.  Swollen L olecranon bursa without erythema or warmth  Neurological:  Walks with cane  Skin: Skin is warm and dry.  Large healing scab at left elbow  Nursing note and vitals reviewed.      Assessment & Plan:   Problem List Items Addressed This Visit    Olecranon bursitis of left elbow - Primary    Traumatic bursitis after fall 2 wks ago. Discussed aspiration vs monitoring. Will monitor for now, hopeful fluid will reabsorb. RTC 2-3 wks if no improvement for aspiration. Pt and wife agree with plan.           Follow up plan: Return if symptoms worsen or fail to  improve.  Ria Bush, MD

## 2017-01-21 NOTE — Patient Instructions (Addendum)
You do have traumatic olecranon bursitis, but fortunately not infected Let's give this some time - body should re absorb fluid over time. If no improvement in 2-3 weeks, come in for drainage.  High dose flu shot today.

## 2017-01-23 DIAGNOSIS — Z23 Encounter for immunization: Secondary | ICD-10-CM | POA: Diagnosis not present

## 2017-01-23 NOTE — Addendum Note (Signed)
Addended by: Brenton Grills on: 13/11/8717 59:74 AM   Modules accepted: Orders

## 2017-05-21 ENCOUNTER — Encounter: Payer: Self-pay | Admitting: Family Medicine

## 2017-05-21 ENCOUNTER — Ambulatory Visit (INDEPENDENT_AMBULATORY_CARE_PROVIDER_SITE_OTHER): Payer: Medicare Other | Admitting: Family Medicine

## 2017-05-21 VITALS — BP 126/80 | HR 72 | Temp 97.9°F | Wt 255.0 lb

## 2017-05-21 DIAGNOSIS — R600 Localized edema: Secondary | ICD-10-CM | POA: Diagnosis not present

## 2017-05-21 DIAGNOSIS — R1031 Right lower quadrant pain: Secondary | ICD-10-CM

## 2017-05-21 LAB — POC URINALSYSI DIPSTICK (AUTOMATED)
BILIRUBIN UA: NEGATIVE
GLUCOSE UA: NEGATIVE
Ketones, UA: NEGATIVE
LEUKOCYTES UA: NEGATIVE
NITRITE UA: NEGATIVE
Protein, UA: NEGATIVE
RBC UA: NEGATIVE
Spec Grav, UA: 1.02 (ref 1.010–1.025)
UROBILINOGEN UA: 0.2 U/dL
pH, UA: 6.5 (ref 5.0–8.0)

## 2017-05-21 LAB — CBC WITH DIFFERENTIAL/PLATELET
BASOS ABS: 0.1 10*3/uL (ref 0.0–0.1)
Basophils Relative: 1.1 % (ref 0.0–3.0)
EOS ABS: 0.3 10*3/uL (ref 0.0–0.7)
Eosinophils Relative: 5 % (ref 0.0–5.0)
HEMATOCRIT: 45.3 % (ref 39.0–52.0)
HEMOGLOBIN: 15.4 g/dL (ref 13.0–17.0)
LYMPHS PCT: 30.7 % (ref 12.0–46.0)
Lymphs Abs: 1.9 10*3/uL (ref 0.7–4.0)
MCHC: 33.9 g/dL (ref 30.0–36.0)
MCV: 97 fl (ref 78.0–100.0)
Monocytes Absolute: 0.6 10*3/uL (ref 0.1–1.0)
Monocytes Relative: 9.9 % (ref 3.0–12.0)
NEUTROS ABS: 3.3 10*3/uL (ref 1.4–7.7)
Neutrophils Relative %: 53.3 % (ref 43.0–77.0)
Platelets: 181 10*3/uL (ref 150.0–400.0)
RBC: 4.67 Mil/uL (ref 4.22–5.81)
RDW: 13.9 % (ref 11.5–15.5)
WBC: 6.2 10*3/uL (ref 4.0–10.5)

## 2017-05-21 LAB — TSH: TSH: 1.68 u[IU]/mL (ref 0.35–4.50)

## 2017-05-21 LAB — COMPREHENSIVE METABOLIC PANEL
ALK PHOS: 48 U/L (ref 39–117)
ALT: 45 U/L (ref 0–53)
AST: 33 U/L (ref 0–37)
Albumin: 4.4 g/dL (ref 3.5–5.2)
BILIRUBIN TOTAL: 0.5 mg/dL (ref 0.2–1.2)
BUN: 24 mg/dL — ABNORMAL HIGH (ref 6–23)
CALCIUM: 9.6 mg/dL (ref 8.4–10.5)
CO2: 29 mEq/L (ref 19–32)
Chloride: 104 mEq/L (ref 96–112)
Creatinine, Ser: 1.28 mg/dL (ref 0.40–1.50)
GFR: 58.49 mL/min — AB (ref >60.00)
GLUCOSE: 93 mg/dL (ref 70–99)
Potassium: 4.4 mEq/L (ref 3.5–5.1)
Sodium: 141 mEq/L (ref 135–145)
TOTAL PROTEIN: 7.2 g/dL (ref 6.0–8.3)

## 2017-05-21 NOTE — Assessment & Plan Note (Signed)
Worsened pedal edema after recent prolonged road trip to [place] for educational ALS conference. 11 lb weight gain noted since 01/2017. Anticipate dependent edema due to this. Discussed. rec conservative management today - elevation of legs, limiting sodium, increased water. Will continue chlorthalidone. He states he cannot wear compression stockings Check labwork (UA, CBC, CPM, TSH) today to assess other causes of pedal edema.  Check D dimer after prolonged travel. If positive will check venous [place] r/o DVT.

## 2017-05-21 NOTE — Addendum Note (Signed)
Addended by: Brenton Grills on: 0/74/6002 98:47 PM   Modules accepted: Orders

## 2017-05-21 NOTE — Patient Instructions (Addendum)
I think leg swelling is due to recent prolonged trip.  Keep legs elevated, limit salt in diet, drink plenty of water.  Labs today, urinalysis today.  We may do a short course of a stronger water pill.   Edema Edema is when you have too much fluid in your body or under your skin. Edema may make your legs, feet, and ankles swell up. Swelling is also common in looser tissues, like around your eyes. This is a common condition. It gets more common as you get older. There are many possible causes of edema. Eating too much salt (sodium) and being on your feet or sitting for a long time can cause edema in your legs, feet, and ankles. Hot weather may make edema worse. Edema is usually painless. Your skin may look swollen or shiny. Follow these instructions at home:  Keep the swollen body part raised (elevated) above the level of your heart when you are sitting or lying down.  Do not sit still or stand for a long time.  Do not wear tight clothes. Do not wear garters on your upper legs.  Exercise your legs. This can help the swelling go down.  Wear elastic bandages or support stockings as told by your doctor.  Eat a low-salt (low-sodium) diet to reduce fluid as told by your doctor.  Depending on the cause of your swelling, you may need to limit how much fluid you drink (fluid restriction).  Take over-the-counter and prescription medicines only as told by your doctor. Contact a doctor if:  Treatment is not working.  You have heart, liver, or kidney disease and have symptoms of edema.  You have sudden and unexplained weight gain. Get help right away if:  You have shortness of breath or chest pain.  You cannot breathe when you lie down.  You have pain, redness, or warmth in the swollen areas.  You have heart, liver, or kidney disease and get edema all of a sudden.  You have a fever and your symptoms get worse all of a sudden. Summary  Edema is when you have too much fluid in your body  or under your skin.  Edema may make your legs, feet, and ankles swell up. Swelling is also common in looser tissues, like around your eyes.  Raise (elevate) the swollen body part above the level of your heart when you are sitting or lying down.  Follow your doctor's instructions about diet and how much fluid you can drink (fluid restriction). This information is not intended to replace advice given to you by your health care provider. Make sure you discuss any questions you have with your health care provider. Document Released: 09/25/2007 Document Revised: 04/26/2016 Document Reviewed: 04/26/2016 Elsevier Interactive Patient Education  2017 [employer].

## 2017-05-21 NOTE — Progress Notes (Signed)
BP 126/80 (BP Location: Left Arm, Patient Position: Sitting, Cuff Size: Large)   Pulse 72   Temp 97.9 F (36.6 C) (Oral)   Wt 255 lb (115.7 kg)   SpO2 94%   BMI 36.33 kg/m    CC: leg swelling, abd discomfort after fall Subjective:    Patient ID: Edward Frank, male    DOB: 10-29-1945, 72 y.o.   MRN: [id number]  HPI: Edward Frank is a 72 y.o. male presenting on 05/21/2017 for Leg Swelling (Bilateral LE swelling up to groin, left is worse. Has pain/burning in tailbone area of back. Started about 6 days ago.) and Abdominal Pain (On left side due to a fall awhile ago. Pain is worsening.)   Here with wife today.   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 Noticing some cough last few days. Chronic dyspnea with exertion. Some RUQ discomfort since fall 1 yr ago where he cracked ribs. S/p cholecystectomy 05/2016. He sleeps on couch due to lower back discomfort when laying supine on bed.   Denies fevers/chills, congestion, chest pain, nausea/vomiting, diarrhea. No redness or warmth or pain of legs with swelling, no weeping.  Denies changes in diet, or increased salt intake.  He was recently at [facility] for 5 days. Using scooter so he was able to manage well. They drove down to [place]. Returned on Monday.   Relevant past medical, surgical, family and social history reviewed and updated as indicated. Interim medical history since our last visit reviewed. Allergies and medications reviewed and updated. Outpatient Medications Prior to Visit  Medication Sig Dispense Refill  . aspirin EC 81 MG tablet Take 81 mg by mouth daily.    . chlorthalidone (HYGROTON) 50 MG tablet Take 1 tablet (50 mg total) by mouth daily. 90 tablet 3  . Coenzyme Q10 (COQ10) 400 MG CAPS Take 400 mg by mouth daily.    [name] CRANBERRY PO Take 1 capsule by mouth 2  (two) times daily.     . Cyanocobalamin (VITAMIN B-12) 2500 MCG SUBL Place 2,500 mcg under the tongue daily.    [name] desonide (DESOWEN) 0.05 % cream Apply 1 application topically daily as needed (psoriasis).     . hydrocortisone (ANUSOL-HC) 2.5 % rectal cream Place 1 application rectally 2 (two) times daily. 30 g 0  . indomethacin (INDOCIN) 25 MG capsule Take 25 mg by mouth daily.     [name] losartan (COZAAR) 50 MG tablet Take 1 tablet (50 mg total) by mouth daily. 90 tablet 3  . methocarbamol (ROBAXIN) 750 MG tablet Take 750 mg by mouth 2 (two) times daily.     . Multiple Vitamin (MULTIVITAMIN WITH MINERALS) TABS Take 1 tablet by mouth daily.    . Omega-3 Fatty Acids (FISH OIL TRIPLE STRENGTH) 1400 MG CAPS Take 2,800 mg by mouth 2 (two) times daily.     [name] omeprazole (PRILOSEC) 20 MG capsule Take 40 mg by mouth daily.     [name] OVER THE COUNTER MEDICATION Take 1 tablet by mouth daily. Calcium citrate+D3 500-800 mg    . pravastatin (PRAVACHOL) 20 MG tablet Take 1 tablet (20 mg total) by mouth at bedtime. 90 tablet 3  . RESVERATROL 100 MG CAPS Take 300 mg by mouth daily.     [name] tiZANidine (ZANAFLEX) 4 MG tablet Take 4 mg by mouth at bedtime as needed  for muscle spasms.     . traMADol (ULTRAM) 50 MG tablet Take 50 mg by mouth 2 (two) times daily.      No facility-administered medications prior to visit.      Per HPI unless specifically indicated in ROS section below Review of Systems     Objective:    BP 126/80 (BP Location: Left Arm, Patient Position: Sitting, Cuff Size: Large)   Pulse 72   Temp 97.9 F (36.6 C) (Oral)   Wt 255 lb (115.7 kg)   SpO2 94%   BMI 36.33 kg/m   Wt Readings from Last 3 Encounters:  05/21/17 255 lb (115.7 kg)  01/21/17 244 lb 8 oz (110.9 kg)  11/22/16 247 lb 8 oz (112.3 kg)    Physical Exam  Constitutional: He is oriented to person, place, and time. He appears well-developed and well-nourished. No distress.  HENT:  Mouth/Throat: Oropharynx is clear and moist. No  oropharyngeal exudate.  Eyes: Conjunctivae and EOM are normal. Pupils are equal, round, and reactive to light.  Neck: Normal range of motion. Neck supple. No thyromegaly present.  Cardiovascular: Normal rate, regular rhythm, normal heart sounds and intact distal pulses.  No murmur heard. Pulmonary/Chest: Effort normal and breath sounds normal. No respiratory distress. He has no wheezes. He has no rales.  Abdominal: Soft. Normal appearance and bowel sounds are normal. He exhibits no distension, no fluid wave and no mass. There is no hepatosplenomegaly. There is no tenderness. There is no rigidity, no rebound, no guarding and negative Murphy's sign.  Musculoskeletal: He exhibits edema (1+ pitting edema to knees).  Neurological: He is alert and oriented to person, place, and time.  Skin: Skin is warm and dry. No rash noted. No erythema.  Psychiatric: He has a normal mood and affect.  Nursing note and vitals reviewed.  Results for orders placed or performed in visit on 11/18/16  Lipid panel  Result Value Ref Range   Cholesterol 161 0 - 200 mg/dL   Triglycerides 178.0 (H) 0.0 - 149.0 mg/dL   HDL 40.20 >39.00 mg/dL   VLDL 35.6 0.0 - 40.0 mg/dL   LDL Cholesterol 85 0 - 99 mg/dL   Total CHOL/HDL Ratio 4    NonHDL 120.83   Comprehensive metabolic panel  Result Value Ref Range   Sodium 139 135 - 145 mEq/L   Potassium 4.3 3.5 - 5.1 mEq/L   Chloride 102 96 - 112 mEq/L   CO2 30 19 - 32 mEq/L   Glucose, Bld 103 (H) 70 - 99 mg/dL   BUN 21 6 - 23 mg/dL   Creatinine, Ser 1.14 0.40 - 1.50 mg/dL   Total Bilirubin 0.5 0.2 - 1.2 mg/dL   Alkaline Phosphatase 42 39 - 117 U/L   AST 26 0 - 37 U/L   ALT 28 0 - 53 U/L   Total Protein 6.7 6.0 - 8.3 g/dL   Albumin 4.4 3.5 - 5.2 g/dL   Calcium 9.9 8.4 - 10.5 mg/dL   GFR 66.95 >60.00 mL/min  PSA, Medicare  Result Value Ref Range   PSA 1.93 0.10 - 4.00 ng/ml      Assessment & Plan:   Problem List Items Addressed This Visit    Pedal edema - Primary     Worsened pedal edema after recent prolonged road trip to [place] for educational ALS conference. 11 lb weight gain noted since 01/2017. Anticipate dependent edema due to this. Discussed. rec conservative management today - elevation of legs, limiting sodium, increased water.  Will continue chlorthalidone. He states he cannot wear compression stockings Check labwork (UA, CBC, CPM, TSH) today to assess other causes of pedal edema.  Check D dimer after prolonged travel. If positive will check venous [place] r/o DVT.       Relevant Orders   Comprehensive metabolic panel   TSH   CBC with Differential/Platelet   D-dimer, quantitative (not at [facility])       Follow up plan: Return if symptoms worsen or fail to improve.  Ria Bush, MD

## 2017-05-22 ENCOUNTER — Other Ambulatory Visit: Payer: Self-pay | Admitting: Family Medicine

## 2017-05-22 ENCOUNTER — Ambulatory Visit (HOSPITAL_COMMUNITY)
Admission: RE | Admit: 2017-05-22 | Discharge: 2017-05-22 | Disposition: A | Discharge status: Home / Self Care | Payer: Medicare Other | Source: Ambulatory Visit | Attending: Family Medicine | Admitting: Family Medicine

## 2017-05-22 DIAGNOSIS — R600 Localized edema: Secondary | ICD-10-CM

## 2017-05-22 LAB — D-DIMER, QUANTITATIVE: D-Dimer, Quant: 0.99 mcg/mL FEU — ABNORMAL HIGH (ref <0.50)

## 2017-05-22 NOTE — Progress Notes (Addendum)
LE venous duplex prelim: negative for DVT. Landry Mellow, RDMS, RVT  Called results to Dr. Danise Mina.

## 2017-05-23 ENCOUNTER — Encounter (HOSPITAL_COMMUNITY): Payer: Medicare Other

## 2017-05-23 ENCOUNTER — Telehealth: Payer: Self-pay | Admitting: Family Medicine

## 2017-05-23 NOTE — Telephone Encounter (Signed)
Spoke with pt's wife, Neoma Laming (on dpr), relaying results. [See CV Proc, 05/22/17.]

## 2017-05-23 NOTE — Telephone Encounter (Signed)
Copied from [place]. Topic: Quick Communication - See Telephone Encounter >> May 23, 2017 12:42 PM Bea Graff, NT wrote: CRM for notification. See Telephone encounter for: Jackelyn Poling, pts wife calling and states pt had his leg US done today and was told to go home and the office would call with further instructions on what the next steps will be for her husband. She is wanting to see if they will be called today? She is concerned for her husband.   05/23/17.

## 2017-05-23 NOTE — Telephone Encounter (Signed)
Final read just came up this morning. See result note. Thanks

## 2017-06-03 ENCOUNTER — Other Ambulatory Visit: Payer: Self-pay

## 2017-06-03 ENCOUNTER — Encounter: Payer: Self-pay | Admitting: Family Medicine

## 2017-06-03 ENCOUNTER — Ambulatory Visit (INDEPENDENT_AMBULATORY_CARE_PROVIDER_SITE_OTHER): Payer: Medicare Other | Admitting: Family Medicine

## 2017-06-03 VITALS — BP 110/72 | HR 71 | Temp 98.5°F | Ht 70.25 in | Wt 250.2 lb

## 2017-06-03 DIAGNOSIS — R1010 Upper abdominal pain, unspecified: Secondary | ICD-10-CM

## 2017-06-03 DIAGNOSIS — R600 Localized edema: Secondary | ICD-10-CM | POA: Diagnosis not present

## 2017-06-03 DIAGNOSIS — R140 Abdominal distension (gaseous): Secondary | ICD-10-CM

## 2017-06-03 NOTE — Patient Instructions (Signed)
Please stop at the front desk to set up referral.  

## 2017-06-03 NOTE — Assessment & Plan Note (Signed)
Given recurrences, non-dependant edema and severity of peripheral swelling at time..Family very concerned. On chlorthalidone. Pt not very active given PLS.. Edema and DOE may be due to  Deconditioning but does not appear to have significant venous insufficiency.  Also pt with abd distention  gradually worsening.  Nml lab eval with recent labs, nml LFTs. No new kidney or thyroid issues. No sign of DVT. Will eval with CT abd for ascites or mass that could be causing lymph compression resulting in severe lower ext edema.

## 2017-06-03 NOTE — Progress Notes (Signed)
   Subjective:    Patient ID: Edward Frank, male    DOB: 1945-12-07, 72 y.o.   MRN: [id number]  HPI    72 year old male with ALS/PLS presents with new onset  significant  In last 2 weeks. Abdominal bloating.  Off and on soreness to palpation  Bilateral upper abdomen.[name] occ  occ diarrhrea  No new meds. Stable dyspnea on exertion.  Deconditioned given PLS.   Saw Dr. Darnell Level on 05/21/2017: 11 lb weight gain noted since 01/2017  Evaluated with labs:  didmer was increase.  Bilateral dopplers  No DVT.  Other lab cbc, CMET , TSH nml  Swelling has improved in last week.      Wt Readings from Last 3 Encounters:  06/03/17 250 lb 4 oz (113.5 kg)  05/21/17 255 lb (115.7 kg)  01/21/17 244 lb 8 oz (110.9 kg)   ECHO 0102: grade 1 diastolic dysfunction  nml EF     05/2016 cholecystitis.  Review of Systems  Constitutional: Positive for fatigue.  HENT: Negative for ear pain.   Eyes: Negative for pain.  Respiratory: Positive for shortness of breath. Negative for cough.   Cardiovascular: Positive for leg swelling. Negative for chest pain and palpitations.  Gastrointestinal: Positive for abdominal distention and abdominal pain. Negative for blood in stool.       Occ diarrhea off and on       Objective:   Physical Exam  Constitutional: He is oriented to person, place, and time. He appears well-developed and well-nourished. No distress.  HENT:  Mouth/Throat: Oropharynx is clear and moist. No oropharyngeal exudate.  Eyes: Conjunctivae and EOM are normal. Pupils are equal, round, and reactive to light.  Neck: Normal range of motion. Neck supple. No thyromegaly present.  Cardiovascular: Normal rate, regular rhythm, normal heart sounds and intact distal pulses.  No murmur heard. Pulmonary/Chest: Effort normal and breath sounds normal. No respiratory distress. He has no wheezes. He has no rales.  Abdominal: Soft. Normal appearance and bowel sounds are normal. He exhibits distension. He exhibits no  shifting dullness, no fluid wave, no ascites, no pulsatile midline mass and no mass. There is no hepatosplenomegaly. There is tenderness in the right upper quadrant. There is no rigidity, no rebound, no guarding and negative Murphy's sign.  Mild pain in RUQ.  no clear hepatomegaly  Musculoskeletal: He exhibits edema (1+ pitting edema to knees).  Neurological: He is alert and oriented to person, place, and time.  Skin: Skin is warm and dry. No rash noted. No erythema.  Psychiatric: He has a normal mood and affect.  Nursing note and vitals reviewed.         Assessment & Plan:

## 2017-06-06 ENCOUNTER — Encounter: Payer: Self-pay | Admitting: *Deleted

## 2017-06-06 ENCOUNTER — Telehealth: Payer: Self-pay | Admitting: Family Medicine

## 2017-06-06 ENCOUNTER — Ambulatory Visit: Payer: Self-pay | Admitting: *Deleted

## 2017-06-06 ENCOUNTER — Ambulatory Visit (INDEPENDENT_AMBULATORY_CARE_PROVIDER_SITE_OTHER)
Admission: RE | Admit: 2017-06-06 | Discharge: 2017-06-06 | Disposition: A | Discharge status: Home / Self Care | Payer: Medicare Other | Source: Ambulatory Visit | Attending: Family Medicine | Admitting: Family Medicine

## 2017-06-06 DIAGNOSIS — R600 Localized edema: Secondary | ICD-10-CM

## 2017-06-06 DIAGNOSIS — R140 Abdominal distension (gaseous): Secondary | ICD-10-CM

## 2017-06-06 MED ORDER — IOPAMIDOL (ISOVUE-300) INJECTION 61%: 100.0000 mL | Freq: Once | INTRAVENOUS | Status: DC | PRN

## 2017-06-06 NOTE — Telephone Encounter (Unsigned)
Copied from [place] [id number]. >> Jun 06, 2017  5:20 PM Neva Seat wrote: Pt's wife called to check on Rx that was suppose to be sent in for pt's Diarrhea.

## 2017-06-06 NOTE — Telephone Encounter (Signed)
See triage note.

## 2017-06-06 NOTE — Telephone Encounter (Unsigned)
Copied from [place] [id number]. Topic: Inquiry >> Jun 06, 2017  5:20 PM Neva Seat wrote: Pt's wife called to check on Rx that was suppose to be sent in for pt's Diarrhea.

## 2017-06-06 NOTE — Telephone Encounter (Signed)
Pt's wife states that the pt has been experiencing diarrhea for over a week. Pt was seen by Dr. Diona Browner on 2/12 and states she mentioned that the pt was experiencing watery explosive diarrhea, but she thinks it may have "slipped under the rug". Pt had a CT scan of abdomen today. Pt given home care advice to see if it would help with diarrhea the pt is experiencing. Pt's wife verbalized understanding and states that she will call the office back if home care doses not work.  Reason for Disposition . SEVERE diarrhea (e.g., 7 or more times / day more than normal)  Protocols used: DIARRHEA-A-AH

## 2017-09-02 ENCOUNTER — Telehealth: Payer: Self-pay | Admitting: Family Medicine

## 2017-09-02 NOTE — Telephone Encounter (Signed)
Pt declines AWV with health coach. Only wants to see PCP

## 2017-09-11 DIAGNOSIS — M25512 Pain in left shoulder: Secondary | ICD-10-CM | POA: Insufficient documentation

## 2017-09-22 ENCOUNTER — Other Ambulatory Visit: Payer: Self-pay | Admitting: Cardiology

## 2017-09-22 DIAGNOSIS — I4510 Unspecified right bundle-branch block: Secondary | ICD-10-CM

## 2017-11-01 NOTE — Progress Notes (Signed)
Cardiology Office Note   Date:  11/03/2017   ID:  Edward Frank, DOB 1946-01-02, MRN [id number]  PCP:  [name], MD  Cardiologist:   Minus Breeding, MD    No chief complaint on file.     History of Present Illness: Edward Frank is a 72 y.o. male who presents for follow up of edema.    I saw him last year for evaluation if dizziness.  He has had dizziness and syncope in the past thought to be neurocardiogenic with micturation syncope.  He has had chronic leg edema and some dyspnea with activity.  He has ALS and gets around slowly and mostly in a motorized scooter.  He has had evaluation of his edema in the past including a recent lower extremity Doppler which demonstrated no DVT.  Echocardiogram is demonstrated well-preserved left ventricular function and no significant pulmonary hypertension.  He is mostly limited by his neuromuscular problems.  His legs swell with some pitting edema which is somewhat intermittent but worse if his feet are down.  He is not describing any new resting shortness of breath, PND or orthopnea.  Is not any palpitations, presyncope or syncope.  He has not had any chest pressure, neck or arm discomfort.    Past Medical History:  Diagnosis Date  . Arthritis   . Bradycardia   . Fatty liver   . Fracture, tibia, with fibula teenage yrs.    no surgery, wore cast  . GERD (gastroesophageal reflux disease)   . Hyperlipidemia   . Hypertension   . 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 OSA (obstructive sleep apnea) 01/22/2011  . PONV (postoperative nausea and vomiting)   . 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 Right bundle branch block   . Syncope     Past Surgical History:  Procedure Laterality Date  . CHOLECYSTECTOMY N/A 06/13/2016   Procedure: LAPAROSCOPIC CHOLECYSTECTOMY;  Surgeon: Arta Bruce Kinsinger, MD;  Location: [facility];  Service: General;  Laterality: N/A;  . EYE SURGERY     Cataracts bil  . FOOT SURGERY  11-2008   hammer toe and bunion  . HERNIA REPAIR  12-2001   inguinal hernia bilateral  . SHOULDER ARTHROSCOPY WITH ROTATOR CUFF REPAIR AND SUBACROMIAL DECOMPRESSION Right 06/18/2012   Procedure: RIGHT SHOULDER ARTHROSCOPY WITH SUBACROMIAL DECOMPRESSION AND DISTAL CLAVICLE RESECTION AND ROTATOR CUFF REPAIR;  Surgeon: Marin Shutter, MD;  Location: [facility];  Service: Orthopedics;  Laterality: Right;  . SHOULDER SURGERY  1977   Luxating      Current Outpatient Medications  Medication Sig Dispense Refill  . aspirin EC 81 MG tablet Take 81 mg by mouth daily.    . chlorthalidone (HYGROTON) 50 MG tablet Take 1 tablet (50 mg total) by mouth daily. 90 tablet 3  . Coenzyme Q10 (COQ10) 400 MG CAPS Take 400 mg by mouth daily.    [name] CRANBERRY PO Take 1 capsule by mouth 2 (two) times daily.     . Cyanocobalamin (VITAMIN B-12) 2500 MCG SUBL Place 2,500 mcg under the tongue daily.    [name] desonide (DESOWEN) 0.05 % cream Apply 1 application topically daily as needed (psoriasis).     . hydrocortisone (ANUSOL-HC) 2.5 % rectal cream Place 1 application rectally 2 (two) times daily. 30 g 0  . indomethacin (INDOCIN) 25 MG capsule Take 25 mg by mouth daily.     [name] losartan (COZAAR) 50 MG tablet TAKE 1  TABLET BY MOUTH ONCE DAILY 90 tablet 1  . methocarbamol (ROBAXIN) 750 MG tablet Take 750 mg by mouth 2 (two) times daily.     . Multiple Vitamin (MULTIVITAMIN WITH MINERALS) TABS Take 1 tablet by mouth daily.    . Omega-3 Fatty Acids (FISH OIL TRIPLE STRENGTH) 1400 MG CAPS Take 2,800 mg by mouth 2 (two) times daily.     [name] omeprazole (PRILOSEC) 20 MG capsule Take 40 mg by mouth daily.     [name] OVER THE COUNTER MEDICATION Take 1 tablet by mouth daily. Calcium citrate+D3 500-800 mg    . pravastatin (PRAVACHOL) 20 MG tablet Take 1 tablet (20 mg total) by mouth at bedtime. 90 tablet 3  . RESVERATROL 100 MG CAPS Take 300 mg by mouth daily.     [name] tiZANidine (ZANAFLEX) 4 MG tablet Take 4 mg by mouth at  bedtime as needed for muscle spasms.     . traMADol (ULTRAM) 50 MG tablet Take 50 mg by mouth 2 (two) times daily.      No current facility-administered medications for this visit.     Allergies:   Gabapentin; Riluzole; and Penicillins    ROS:  Please see the history of present illness.   Otherwise, review of systems are positive for none.   All other systems are reviewed and negative.    PHYSICAL EXAM: VS:  BP 126/76   Pulse (!) 54   Ht 5' 10.25" (1.784 m)   Wt 249 lb (112.9 kg)   SpO2 96%   BMI 35.47 kg/m  , BMI Body mass index is 35.47 kg/m.  GENERAL:  Well appearing NECK:  No jugular venous distention, waveform within normal limits, carotid upstroke brisk and symmetric, no bruits, no thyromegaly LUNGS:  Clear to auscultation bilaterally CHEST:  Unremarkable HEART:  PMI not displaced or sustained,S1 and S2 within normal limits, no S3, no S4, no clicks, no rubs, no murmurs ABD:  Flat, positive bowel sounds normal in frequency in pitch, no bruits, no rebound, no guarding, no midline pulsatile mass, no hepatomegaly, no splenomegaly EXT:  2 plus pulses throughout, left greater than right leg edema, no cyanosis no clubbing  EKG:  EKG is  ordered today. The ekg ordered today demonstrates Sinus rhythm, rate 54, slightly unusual P-wave axis, right bundle branch block, no acute ST-T wave changes.     Recent Labs: 05/21/2017: ALT 45; BUN 24; Creatinine, Ser 1.28; Hemoglobin 15.4; Platelets 181.0; Potassium 4.4; Sodium 141; TSH 1.68    Lipid Panel    Component Value Date/Time   CHOL 161 11/18/2016 1120   TRIG 178.0 (H) 11/18/2016 1120   HDL 40.20 11/18/2016 1120   CHOLHDL 4 11/18/2016 1120   VLDL 35.6 11/18/2016 1120   LDLCALC 85 11/18/2016 1120   LDLDIRECT 125.8 10/26/2009 0830      Wt Readings from Last 3 Encounters:  11/03/17 249 lb (112.9 kg)  06/03/17 250 lb 4 oz (113.5 kg)  05/21/17 255 lb (115.7 kg)      Other studies Reviewed: Additional studies/ records that  were reviewed today include: [facility] Records. Review of the above records demonstrates:     ASSESSMENT AND PLAN:  EDEMA:  This is chronic.  I think this is related to his inability to walk and the fact that his feet are down or at most elevated on the ottoman for good part of the day.  I do not think this represents heart failure.  We talked about conservative strategies and given his previous  syncope I would not start more diuretic.  We are going to try conservative therapies and is going to look into a lift chair and seeing whether he is motorized wheelchair can keep his legs elevated.    HTN:   The blood pressure is at target. No change in medications is indicated. We will continue with therapeutic lifestyle changes (TLC).  AAA:  3.9 cm on ultrasound 3/18.  He will have follow up ultrasound  BRADYCARDIA:  He has no symptoms related to this.  I will follow up.    Current medicines are reviewed at length with the patient today.  The patient does not have concerns regarding medicines.  The following changes have been made:   None Labs/ tests ordered today include: None  No orders of the defined types were placed in this encounter.    Disposition:   FU with me as needed.   Signed, Minus Breeding, MD  11/03/2017 9:53 AM    [facility]

## 2017-11-03 ENCOUNTER — Encounter: Payer: Self-pay | Admitting: Cardiology

## 2017-11-03 ENCOUNTER — Ambulatory Visit (INDEPENDENT_AMBULATORY_CARE_PROVIDER_SITE_OTHER): Payer: Medicare Other | Admitting: Cardiology

## 2017-11-03 VITALS — BP 126/76 | HR 54 | Ht 70.25 in | Wt 249.0 lb

## 2017-11-03 DIAGNOSIS — I4510 Unspecified right bundle-branch block: Secondary | ICD-10-CM

## 2017-11-03 DIAGNOSIS — M7989 Other specified soft tissue disorders: Secondary | ICD-10-CM | POA: Diagnosis not present

## 2017-11-03 DIAGNOSIS — I714 Abdominal aortic aneurysm, without rupture, unspecified: Secondary | ICD-10-CM

## 2017-11-03 MED ORDER — CHLORTHALIDONE 50 MG PO TABS: 50.0000 mg | ORAL_TABLET | Freq: Every day | ORAL | 3 refills | Status: DC

## 2017-11-03 MED ORDER — LOSARTAN POTASSIUM 50 MG PO TABS: 50.0000 mg | ORAL_TABLET | Freq: Every day | ORAL | 3 refills | Status: DC

## 2017-11-03 NOTE — Patient Instructions (Signed)
Medication Instructions:  Continue current medications  If you need a refill on your cardiac medications before your next appointment, please call your pharmacy.  Labwork: None Ordered   Testing/Procedures: Your physician has requested that you have an abdominal aorta duplex. During this test, an ultrasound is used to evaluate the aorta. Allow 30 minutes for this exam. Do not eat after midnight the day before and avoid carbonated beverages  Follow-Up: Your physician wants you to follow-up in: As Needed.      Thank you for choosing [facility] at [facility]!!

## 2017-11-12 ENCOUNTER — Ambulatory Visit (HOSPITAL_COMMUNITY)
Admission: RE | Admit: 2017-11-12 | Discharge: 2017-11-12 | Disposition: A | Discharge status: Home / Self Care | Payer: Medicare Other | Source: Ambulatory Visit | Attending: Cardiology | Admitting: Cardiology

## 2017-11-12 DIAGNOSIS — I714 Abdominal aortic aneurysm, without rupture, unspecified: Secondary | ICD-10-CM

## 2017-11-13 ENCOUNTER — Telehealth: Payer: Self-pay | Admitting: Family Medicine

## 2017-11-13 DIAGNOSIS — E7800 Pure hypercholesterolemia, unspecified: Secondary | ICD-10-CM

## 2017-11-13 DIAGNOSIS — Z125 Encounter for screening for malignant neoplasm of prostate: Secondary | ICD-10-CM

## 2017-11-13 NOTE — Telephone Encounter (Signed)
-----   Message from Lendon Collar, RT sent at 11/12/2017 11:34 AM EDT ----- Regarding: Lab orders for Thursday 11/20/17 Please enter CPE lab orders for 11/20/17. Thanks-Lauren

## 2017-11-14 ENCOUNTER — Other Ambulatory Visit: Payer: Self-pay

## 2017-11-14 DIAGNOSIS — I714 Abdominal aortic aneurysm, without rupture, unspecified: Secondary | ICD-10-CM

## 2017-11-20 ENCOUNTER — Other Ambulatory Visit (INDEPENDENT_AMBULATORY_CARE_PROVIDER_SITE_OTHER): Payer: Medicare Other

## 2017-11-20 DIAGNOSIS — E7800 Pure hypercholesterolemia, unspecified: Secondary | ICD-10-CM

## 2017-11-20 DIAGNOSIS — Z125 Encounter for screening for malignant neoplasm of prostate: Secondary | ICD-10-CM | POA: Diagnosis not present

## 2017-11-20 LAB — COMPREHENSIVE METABOLIC PANEL
ALT: 31 U/L (ref 0–53)
AST: 25 U/L (ref 0–37)
Albumin: 4.6 g/dL (ref 3.5–5.2)
Alkaline Phosphatase: 49 U/L (ref 39–117)
BUN: 18 mg/dL (ref 6–23)
CALCIUM: 10.1 mg/dL (ref 8.4–10.5)
CHLORIDE: 103 meq/L (ref 96–112)
CO2: 30 meq/L (ref 19–32)
Creatinine, Ser: 1.17 mg/dL (ref 0.40–1.50)
GFR: 64.79 mL/min (ref >60.00)
Glucose, Bld: 109 mg/dL — ABNORMAL HIGH (ref 70–99)
POTASSIUM: 4.1 meq/L (ref 3.5–5.1)
Sodium: 140 mEq/L (ref 135–145)
Total Bilirubin: 0.5 mg/dL (ref 0.2–1.2)
Total Protein: 7 g/dL (ref 6.0–8.3)

## 2017-11-20 LAB — LIPID PANEL
CHOL/HDL RATIO: 4
Cholesterol: 163 mg/dL (ref 0–200)
HDL: 40.8 mg/dL (ref >39.00)
NONHDL: 122.29
TRIGLYCERIDES: 202 mg/dL — AB (ref 0.0–149.0)
VLDL: 40.4 mg/dL — AB (ref 0.0–40.0)

## 2017-11-20 LAB — LDL CHOLESTEROL, DIRECT: Direct LDL: 81 mg/dL

## 2017-11-20 LAB — PSA, MEDICARE: PSA: 2.07 ng/ml (ref 0.10–4.00)

## 2017-11-25 ENCOUNTER — Ambulatory Visit (INDEPENDENT_AMBULATORY_CARE_PROVIDER_SITE_OTHER): Payer: Medicare Other | Admitting: Family Medicine

## 2017-11-25 ENCOUNTER — Encounter: Payer: Self-pay | Admitting: Family Medicine

## 2017-11-25 VITALS — BP 104/60 | HR 71 | Temp 98.4°F | Ht 71.0 in | Wt 253.3 lb

## 2017-11-25 DIAGNOSIS — G1221 Amyotrophic lateral sclerosis: Secondary | ICD-10-CM

## 2017-11-25 DIAGNOSIS — Z1159 Encounter for screening for other viral diseases: Secondary | ICD-10-CM

## 2017-11-25 DIAGNOSIS — I714 Abdominal aortic aneurysm, without rupture, unspecified: Secondary | ICD-10-CM

## 2017-11-25 DIAGNOSIS — E7800 Pure hypercholesterolemia, unspecified: Secondary | ICD-10-CM

## 2017-11-25 DIAGNOSIS — Z23 Encounter for immunization: Secondary | ICD-10-CM | POA: Diagnosis not present

## 2017-11-25 DIAGNOSIS — I10 Essential (primary) hypertension: Secondary | ICD-10-CM

## 2017-11-25 DIAGNOSIS — Z0000 Encounter for general adult medical examination without abnormal findings: Secondary | ICD-10-CM

## 2017-11-25 DIAGNOSIS — K640 First degree hemorrhoids: Secondary | ICD-10-CM

## 2017-11-25 DIAGNOSIS — G1229 Other motor neuron disease: Secondary | ICD-10-CM

## 2017-11-25 DIAGNOSIS — R7303 Prediabetes: Secondary | ICD-10-CM | POA: Insufficient documentation

## 2017-11-25 NOTE — Assessment & Plan Note (Signed)
Well controlled. Continue current medication.  

## 2017-11-25 NOTE — Addendum Note (Signed)
Addended by: Carter Kitten on: 11/25/2017 03:09 PM   Modules accepted: Orders

## 2017-11-25 NOTE — Progress Notes (Signed)
Subjective:    Patient ID: Edward Frank, male    DOB: 1945-05-19, 72 y.o.   MRN: [id number]  HPI   The patient presents for annual medicare wellness, complete physical and review of chronic health problems. He/She also has the following acute concerns today:  I have personally reviewed the Medicare Annual Wellness questionnaire and have noted 1. [name] medical and social history 2. Their use of alcohol, tobacco or illicit drugs 3. Their current medications and supplements 4. [name] functional ability including ADL's, fall risks, home safety risks and hearing or visual             impairment. 5. Diet and physical activities 6. Evidence for depression or mood disorders 7.         Updated provider list Cognitive evaluation was performed and recorded on pt medicare questionnaire form. The patients weight, height, BMI and visual acuity have been recorded in the chart  I have made referrals, counseling and provided education to the patient based review of the above and I have provided the pt with a written personalized care plan for preventive services.   Documentation of this information was scanned into the electronic record under the media tab.  Hypertension:   good control on losartan and HCTZ  Using medication without problems or lightheadedness: none Chest pain with exertion:none Edema: yes Short of breath: some with exertion given deconditioning Average home BPs: Other issues:  Elevated Cholesterol:  LDL at goal on pravastatin Lab Results  Component Value Date   CHOL 163 11/20/2017   HDL 40.80 11/20/2017   LDLCALC 85 11/18/2016   LDLDIRECT 81.0 11/20/2017   TRIG 202.0 (H) 11/20/2017   CHOLHDL 4 11/20/2017  Using medications without problems: Muscle aches:  Diet compliance: moderate Exercise: minimal Other complaints:  Followed by cardiologist for neurogenic syncope , RBBB and bradycardia. Dr. Percival Spanish Felt peripheral edema from inactivity and  venous insufficiency. Recommended compression hose.  PLS/upper motor dominant AS: Continue slow progression change.  Was followed at [place]. Now followed by Dr. Delford Field He has walker and cane.. Has not been using much. Pt doing fairly well.  Falling 1-2 times a month.  Moves slowly to avoid.  Diagnosis of fusiform Terminal AAA, . [facility] finding [facility]  03/22/2016 3.7 cm   07/17/2017: 3.9 cm, stable 11/12/2017  Stable 3.9 cm Followed q 2 years  Followed by Dr. Percival Spanish Cardiology.  Social History /Family History/Past Medical History reviewed in detail and updated in EMR if needed. Blood pressure 104/60, pulse 71, temperature 98.4 F (36.9 C), temperature source Oral, height 5\' 11"  (1.803 m), weight 253 lb 5 oz (114.9 kg).   Advance directives and end of life planning reviewed in detail with patient and documented in EMR. Patient given handout on advance care directives if needed. HCPOA and living will updated if needed.  Hearing Screening   Method: Audiometry   125Hz  250Hz  500Hz  1000Hz  2000Hz  3000Hz  4000Hz  6000Hz  8000Hz   Right ear:   20 20 20  20     Left ear:   40 40 20  0    Vision Screening Comments: Eye Exam with Dr. Radford Pax in [place] on 2018  Fall Risk  11/25/2017 11/22/2016 12/27/2015 11/21/2015 11/17/2014  Falls in the past year? No Yes Yes Yes Yes  Comment - - Emmi Telephone Survey: data to providers prior to load - -  Number falls in past yr: - 2 or more 2 or more 2 or more 2 or more  Comment - -  Emmi Telephone Survey Actual Response = 6 - -  Injury with Fall? - No Yes Yes No  Risk Factor Category  - - - High Fall Risk High Fall Risk  Risk for fall due to : - - - History of fall(s) History of fall(s)   Depression screen [facility] 2/9 11/25/2017 11/22/2016 11/21/2015  Decreased Interest 0 0 0  Down, Depressed, Hopeless 0 0 0  PHQ - 2 Score 0 0 0    Review of Systems  Constitutional: Negative for fatigue and fever.  HENT: Negative for ear pain.   Eyes: Negative for pain.  Respiratory:  Negative for cough and shortness of breath.   Cardiovascular: Negative for chest pain, palpitations and leg swelling.  Gastrointestinal: Negative for abdominal pain.  Genitourinary: Negative for dysuria.  Musculoskeletal: Negative for arthralgias.  Neurological: Negative for syncope, light-headedness and headaches.  Psychiatric/Behavioral: Negative for dysphoric mood.       Objective:   Physical Exam  Constitutional: Vital signs are normal. He appears well-developed and well-nourished.  Overweight male in NAD  HENT:  Head: Normocephalic.  Right Ear: Hearing normal.  Left Ear: Hearing normal.  Nose: Nose normal.  Mouth/Throat: Oropharynx is clear and moist and mucous membranes are normal.  Neck: Trachea normal. Carotid bruit is not present. No thyroid mass and no thyromegaly present.  Cardiovascular: Normal rate, regular rhythm and normal pulses. Exam reveals no gallop, no distant heart sounds and no friction rub.  No murmur heard. Bilateral 2 plus pitting peripheral edema at this time, mild chronic venous stasis changes.  Pulmonary/Chest: Effort normal and breath sounds normal. No respiratory distress. He has no decreased breath sounds. He has no wheezes. He has no rhonchi. He has no rales.  Abdominal: Normal appearance and bowel sounds are normal. There is no hepatosplenomegaly. There is no tenderness. There is no CVA tenderness. No hernia.  Genitourinary: Rectal exam shows internal hemorrhoid, tenderness and guaiac positive stool. Rectal exam shows no external hemorrhoid, no fissure, no mass and anal tone normal.  Genitourinary Comments: Anoscopy: performed showing non-oozing internal hemorrhoids grade 1, no complications to procedure  Skin: Skin is warm, dry and intact. No rash noted.  Psychiatric: He has a normal mood and affect. His speech is normal and behavior is normal. Thought content normal.          Assessment & Plan:  [name] preventative maintenance and  recommended screening tests for an annual wellness exam were reviewed in full today. Brought up to date unless services declined.  Counselled on the importance of diet, exercise, and its role in overall health and mortality. [name] FH and SH was reviewed, including their home life, tobacco status, and drug and alcohol status.   Vaccines: Due for Pneumvax and UPTODATE prevnar and shingles. Prostate: Father with prostate cancer age 23s. Discussed in detail, have chose to check PSA and rectal exam yearly. Lab Results  Component Value Date   PSA 2.07 11/20/2017   PSA 1.93 11/18/2016   PSA 1.83 11/17/2015   Colon: Sister with colon cancer. Had 10/12/13 at [facility], repeat in 5 years.  nonsmoker Hep C screening: do

## 2017-11-25 NOTE — Assessment & Plan Note (Signed)
Work on low [employer].

## 2017-11-25 NOTE — Assessment & Plan Note (Signed)
Followed by neurology.   

## 2017-11-25 NOTE — Patient Instructions (Addendum)
Work on low [employer].  Please stop at the lab to have labs drawn.

## 2017-11-25 NOTE — Assessment & Plan Note (Signed)
Stable recent check .[name] Followed by Dr. Percival Spanish.

## 2017-11-26 LAB — HEPATITIS C ANTIBODY
Hepatitis C Ab: NONREACTIVE
SIGNAL TO CUT-OFF: 0.01 (ref <1.00)

## 2017-11-27 ENCOUNTER — Encounter: Payer: Self-pay | Admitting: *Deleted

## 2017-11-28 ENCOUNTER — Other Ambulatory Visit: Payer: Self-pay | Admitting: Cardiology

## 2017-11-28 DIAGNOSIS — I4510 Unspecified right bundle-branch block: Secondary | ICD-10-CM

## 2018-02-04 ENCOUNTER — Encounter (HOSPITAL_BASED_OUTPATIENT_CLINIC_OR_DEPARTMENT_OTHER): Payer: Medicare Other | Attending: Physician Assistant

## 2018-02-04 DIAGNOSIS — L89313 Pressure ulcer of right buttock, stage 3: Secondary | ICD-10-CM | POA: Insufficient documentation

## 2018-02-04 DIAGNOSIS — M419 Scoliosis, unspecified: Secondary | ICD-10-CM | POA: Diagnosis not present

## 2018-02-04 DIAGNOSIS — I10 Essential (primary) hypertension: Secondary | ICD-10-CM | POA: Diagnosis not present

## 2018-02-04 DIAGNOSIS — G1221 Amyotrophic lateral sclerosis: Secondary | ICD-10-CM | POA: Insufficient documentation

## 2018-02-11 DIAGNOSIS — L89313 Pressure ulcer of right buttock, stage 3: Secondary | ICD-10-CM | POA: Diagnosis not present

## 2018-02-18 DIAGNOSIS — L89313 Pressure ulcer of right buttock, stage 3: Secondary | ICD-10-CM | POA: Diagnosis not present

## 2018-03-05 ENCOUNTER — Telehealth: Payer: Self-pay

## 2018-03-05 NOTE — Telephone Encounter (Signed)
Dr. Percival Spanish  Pt needs shoulder repair under general anesthesia., I will talk with him on Friday, but if stable can I clear?  And is it ok to hold ASA for 5 days for procedure. Thanks. Mickel Baas please send results to pre-op

## 2018-03-05 NOTE — Telephone Encounter (Signed)
   [facility] Pre-operative Risk Assessment    Request for surgical clearance:  1. What type of surgery is being performed? Reversed Total Left Shoulder  2. When is this surgery scheduled? TBD  3. What type of clearance is required (medical clearance vs. Pharmacy clearance to hold med vs. Both)? Both  4. Are there any medications that need to be held prior to surgery and how long? Aspirin  5. Practice name and name of physician performing surgery? [facility]  Dr.Kevin Supple  6. What is your office phone number [phone]   7.   What is your office fax number [phone]  8.   Anesthesia type (None, local, MAC, general) ? General   Edward Frank 03/05/2018, 8:23 AM  _________________________________________________________________   (provider comments below)

## 2018-03-06 NOTE — Telephone Encounter (Signed)
I talked to pt and he has not had chest pain or SOB, he has ALS and cannot do lots of activity, he does work in yard at times, getting up leaves, he tells me he cannot stand the pain in shoulder.  I believe he is at his normal level of health and would clear but would like input from [facility].  And about ASA.  I left message for [facility]

## 2018-03-08 NOTE — Telephone Encounter (Signed)
OK to have shoulder surgery.  According to ACC/AHA guidelines no further testing is indicated.  Call Mr. Schalk with the results and send results to [name], MD

## 2018-03-09 NOTE — Telephone Encounter (Signed)
Patient notified that he has been cleared and was given aspirin instructions he voiced understanding. Clearance letter faxed to Dr Susie Cassette office.

## 2018-03-09 NOTE — Telephone Encounter (Signed)
   Primary Cardiologist: Minus Breeding, MD  Chart reviewed as part of pre-operative protocol coverage. Given past medical history and time since last visit, based on ACC/AHA guidelines, Edward Frank would be at acceptable risk for the planned procedure without further cardiovascular testing.   I will route this recommendation to the requesting party via Epic fax function and remove from pre-op pool.  Please call with questions. May hold ASA for 5 days prior to the procedure. Case has been reviewed by Dr. Percival Spanish. Per Dr. Percival Spanish: "OK to have shoulder surgery.  According to ACC/AHA guidelines no further testing is indicated.  Call Edward Frank with the results and send results to [name], MD"  Edward Frank, [place] 03/09/2018, 2:27 PM

## 2018-03-18 ENCOUNTER — Encounter (HOSPITAL_BASED_OUTPATIENT_CLINIC_OR_DEPARTMENT_OTHER): Payer: Medicare Other | Attending: Physician Assistant

## 2018-03-18 DIAGNOSIS — X58XXXA Exposure to other specified factors, initial encounter: Secondary | ICD-10-CM | POA: Insufficient documentation

## 2018-03-18 DIAGNOSIS — G1221 Amyotrophic lateral sclerosis: Secondary | ICD-10-CM | POA: Diagnosis not present

## 2018-03-18 DIAGNOSIS — L89313 Pressure ulcer of right buttock, stage 3: Secondary | ICD-10-CM | POA: Insufficient documentation

## 2018-03-18 DIAGNOSIS — M419 Scoliosis, unspecified: Secondary | ICD-10-CM | POA: Insufficient documentation

## 2018-03-18 DIAGNOSIS — S31829A Unspecified open wound of left buttock, initial encounter: Secondary | ICD-10-CM | POA: Insufficient documentation

## 2018-03-18 DIAGNOSIS — I10 Essential (primary) hypertension: Secondary | ICD-10-CM | POA: Insufficient documentation

## 2018-03-22 ENCOUNTER — Other Ambulatory Visit: Payer: Self-pay | Admitting: Cardiology

## 2018-03-22 DIAGNOSIS — I4510 Unspecified right bundle-branch block: Secondary | ICD-10-CM

## 2018-04-08 ENCOUNTER — Encounter (HOSPITAL_BASED_OUTPATIENT_CLINIC_OR_DEPARTMENT_OTHER): Payer: Medicare Other | Attending: Internal Medicine

## 2018-04-08 DIAGNOSIS — Z872 Personal history of diseases of the skin and subcutaneous tissue: Secondary | ICD-10-CM | POA: Insufficient documentation

## 2018-04-08 DIAGNOSIS — Z09 Encounter for follow-up examination after completed treatment for conditions other than malignant neoplasm: Secondary | ICD-10-CM | POA: Diagnosis present

## 2018-04-08 DIAGNOSIS — I10 Essential (primary) hypertension: Secondary | ICD-10-CM | POA: Diagnosis not present

## 2018-05-22 NOTE — Patient Instructions (Signed)
Edward Frank  05/22/2018   Your procedure is scheduled on: Thursday 05/28/2018  Report to [facility] Main  Entrance              Report to admitting at  0530  AM    Call this number if you have problems the morning of surgery [phone]    Remember: Do not eat food or drink liquids :After Midnight.               BRUSH YOUR TEETH MORNING OF SURGERY AND RINSE YOUR MOUTH OUT, NO CHEWING GUM CANDY OR MINTS.     Take these medicines the morning of surgery with A SIP OF WATER:  Omeprazole (Prilosec)                                  You may not have any metal on your body including hair pins and              piercings  Do not wear jewelry, make-up, lotions, powders or perfumes, deodorant             Men may shave face and neck.   Do not bring valuables to the hospital. [name].  Contacts, dentures or bridgework may not be worn into surgery.  Leave suitcase in the car. After surgery it may be brought to your room.                Please read over the following fact sheets you were given: _____________________________________________________________________             [facility] - Preparing for Surgery Before surgery, you can play an important role.  Because skin is not sterile, your skin needs to be as free of germs as possible.  You can reduce the number of germs on your skin by washing with CHG (chlorahexidine gluconate) soap before surgery.  CHG is an antiseptic cleaner which kills germs and bonds with the skin to continue killing germs even after washing. Please DO NOT use if you have an allergy to CHG or antibacterial soaps.  If your skin becomes reddened/irritated stop using the CHG and inform your nurse when you arrive at Short Stay. Do not shave (including legs and underarms) for at least 48 hours prior to the first CHG shower.  You may shave your face/neck. Please follow these instructions  carefully:  1.  Shower with CHG Soap the night before surgery and the  morning of Surgery.  2.  If you choose to wash your hair, wash your hair first as usual with your  normal  shampoo.  3.  After you shampoo, rinse your hair and body thoroughly to remove the  shampoo.                           4.  Use CHG as you would any other liquid soap.  You can apply chg directly  to the skin and wash                       Gently with a scrungie or clean washcloth.  5.  Apply the CHG Soap to your body ONLY  FROM THE NECK DOWN.   Do not use on face/ open                           Wound or open sores. Avoid contact with eyes, ears mouth and genitals (private parts).                       Wash face,  Genitals (private parts) with your normal soap.             6.  Wash thoroughly, paying special attention to the area where your surgery  will be performed.  7.  Thoroughly rinse your body with warm water from the neck down.  8.  DO NOT shower/wash with your normal soap after using and rinsing off  the CHG Soap.                9.  Pat yourself dry with a clean towel.            10.  Wear clean pajamas.            11.  Place clean sheets on your bed the night of your first shower and do not  sleep with pets. Day of Surgery : Do not apply any lotions/deodorants the morning of surgery.  Please wear clean clothes to the hospital/surgery center.  FAILURE TO FOLLOW THESE INSTRUCTIONS MAY RESULT IN THE CANCELLATION OF YOUR SURGERY PATIENT SIGNATURE_________________________________  NURSE SIGNATURE__________________________________  ________________________________________________________________________   Edward Frank  An incentive spirometer is a tool that can help keep your lungs clear and active. This tool measures how well you are filling your lungs with each breath. Taking long deep breaths may help reverse or decrease the chance of developing breathing (pulmonary) problems (especially infection)  following:  A long period of time when you are unable to move or be active. BEFORE THE PROCEDURE   If the spirometer includes an indicator to show your best effort, your nurse or respiratory therapist will set it to a desired goal.  If possible, sit up straight or lean slightly forward. Try not to slouch.  Hold the incentive spirometer in an upright position. INSTRUCTIONS FOR USE  1. Sit on the edge of your bed if possible, or sit up as far as you can in bed or on a chair. 2. Hold the incentive spirometer in an upright position. 3. Breathe out normally. 4. Place the mouthpiece in your mouth and seal your lips tightly around it. 5. Breathe in slowly and as deeply as possible, raising the piston or the ball toward the top of the column. 6. Hold your breath for 3-5 seconds or for as long as possible. Allow the piston or ball to fall to the bottom of the column. 7. Remove the mouthpiece from your mouth and breathe out normally. 8. Rest for a few seconds and repeat Steps 1 through 7 at least 10 times every 1-2 hours when you are awake. Take your time and take a few normal breaths between deep breaths. 9. The spirometer may include an indicator to show your best effort. Use the indicator as a goal to work toward during each repetition. 10. After each set of 10 deep breaths, practice coughing to be sure your lungs are clear. If you have an incision (the cut made at the time of surgery), support your incision when coughing by placing a pillow or rolled up towels firmly against it. Once you  are able to get out of bed, walk around indoors and cough well. You may stop using the incentive spirometer when instructed by your caregiver.  RISKS AND COMPLICATIONS  Take your time so you do not get dizzy or light-headed.  If you are in pain, you may need to take or ask for pain medication before doing incentive spirometry. It is harder to take a deep breath if you are having pain. AFTER USE  Rest and  breathe slowly and easily.  It can be helpful to keep track of a log of your progress. Your caregiver can provide you with a simple table to help with this. If you are using the spirometer at home, follow these instructions: [employer] IF:   You are having difficultly using the spirometer.  You have trouble using the spirometer as often as instructed.  Your pain medication is not giving enough relief while using the spirometer.  You develop fever of 100.5 F (38.1 C) or higher. SEEK IMMEDIATE MEDICAL CARE IF:   You cough up bloody sputum that had not been present before.  You develop fever of 102 F (38.9 C) or greater.  You develop worsening pain at or near the incision site. MAKE SURE YOU:   Understand these instructions.  Will watch your condition.  Will get help right away if you are not doing well or get worse. Document Released: 08/19/2006 Document Revised: 07/01/2011 Document Reviewed: 10/20/2006 [facility] Patient Information 2014 [place], [place].   ________________________________________________________________________

## 2018-05-22 NOTE — Progress Notes (Signed)
03/09/2018-Cardiac Clearance from [facility], [place] on chart  11/12/2017- noted in Epic- VAS [place] AAA duplex complete  11/03/2017- noted in [place]- office noted with Dr. Percival Spanish  06/06/2017- noted in [place] w/contrast

## 2018-05-25 ENCOUNTER — Encounter (HOSPITAL_COMMUNITY): Payer: Self-pay

## 2018-05-25 ENCOUNTER — Encounter (HOSPITAL_COMMUNITY)
Admission: RE | Admit: 2018-05-25 | Discharge: 2018-05-25 | Disposition: A | Discharge status: Home / Self Care | Payer: Medicare Other | Source: Ambulatory Visit | Attending: Orthopedic Surgery | Admitting: Orthopedic Surgery

## 2018-05-25 ENCOUNTER — Other Ambulatory Visit: Payer: Self-pay

## 2018-05-25 DIAGNOSIS — G4733 Obstructive sleep apnea (adult) (pediatric): Secondary | ICD-10-CM | POA: Diagnosis not present

## 2018-05-25 DIAGNOSIS — G1223 Primary lateral sclerosis: Secondary | ICD-10-CM | POA: Diagnosis not present

## 2018-05-25 DIAGNOSIS — Z823 Family history of stroke: Secondary | ICD-10-CM | POA: Diagnosis not present

## 2018-05-25 DIAGNOSIS — M19012 Primary osteoarthritis, left shoulder: Secondary | ICD-10-CM | POA: Insufficient documentation

## 2018-05-25 DIAGNOSIS — M25312 Other instability, left shoulder: Secondary | ICD-10-CM | POA: Diagnosis not present

## 2018-05-25 DIAGNOSIS — M75102 Unspecified rotator cuff tear or rupture of left shoulder, not specified as traumatic: Secondary | ICD-10-CM | POA: Diagnosis not present

## 2018-05-25 DIAGNOSIS — Z01812 Encounter for preprocedural laboratory examination: Secondary | ICD-10-CM | POA: Insufficient documentation

## 2018-05-25 DIAGNOSIS — Z79899 Other long term (current) drug therapy: Secondary | ICD-10-CM | POA: Diagnosis not present

## 2018-05-25 DIAGNOSIS — Z8249 Family history of ischemic heart disease and other diseases of the circulatory system: Secondary | ICD-10-CM | POA: Diagnosis not present

## 2018-05-25 DIAGNOSIS — K219 Gastro-esophageal reflux disease without esophagitis: Secondary | ICD-10-CM | POA: Diagnosis not present

## 2018-05-25 DIAGNOSIS — I10 Essential (primary) hypertension: Secondary | ICD-10-CM | POA: Diagnosis not present

## 2018-05-25 DIAGNOSIS — Z7982 Long term (current) use of aspirin: Secondary | ICD-10-CM | POA: Diagnosis not present

## 2018-05-25 DIAGNOSIS — Z888 Allergy status to other drugs, medicaments and biological substances status: Secondary | ICD-10-CM | POA: Diagnosis not present

## 2018-05-25 DIAGNOSIS — R001 Bradycardia, unspecified: Secondary | ICD-10-CM | POA: Diagnosis not present

## 2018-05-25 DIAGNOSIS — Z800 Family history of malignant neoplasm of digestive organs: Secondary | ICD-10-CM | POA: Diagnosis not present

## 2018-05-25 DIAGNOSIS — Z791 Long term (current) use of non-steroidal anti-inflammatories (NSAID): Secondary | ICD-10-CM | POA: Diagnosis not present

## 2018-05-25 DIAGNOSIS — E785 Hyperlipidemia, unspecified: Secondary | ICD-10-CM | POA: Diagnosis not present

## 2018-05-25 LAB — BASIC METABOLIC PANEL
Anion gap: 10 (ref 5–15)
BUN: 25 mg/dL — ABNORMAL HIGH (ref 8–23)
CHLORIDE: 106 mmol/L (ref 98–111)
CO2: 23 mmol/L (ref 22–32)
Calcium: 9.6 mg/dL (ref 8.9–10.3)
Creatinine, Ser: 1.13 mg/dL (ref 0.61–1.24)
GFR calc Af Amer: >60 mL/min (ref >60)
GFR calc non Af Amer: >60 mL/min (ref >60)
Glucose, Bld: 108 mg/dL — ABNORMAL HIGH (ref 70–99)
POTASSIUM: 3.7 mmol/L (ref 3.5–5.1)
Sodium: 139 mmol/L (ref 135–145)

## 2018-05-25 LAB — CBC
HEMATOCRIT: 48.3 % (ref 39.0–52.0)
Hemoglobin: 16.1 g/dL (ref 13.0–17.0)
MCH: 32.5 pg (ref 26.0–34.0)
MCHC: 33.3 g/dL (ref 30.0–36.0)
MCV: 97.6 fL (ref 80.0–100.0)
NRBC: 0 % (ref 0.0–0.2)
Platelets: 196 10*3/uL (ref 150–400)
RBC: 4.95 MIL/uL (ref 4.22–5.81)
RDW: 13.4 % (ref 11.5–15.5)
WBC: 8.5 10*3/uL (ref 4.0–10.5)

## 2018-05-25 LAB — SURGICAL PCR SCREEN
MRSA, PCR: NEGATIVE
Staphylococcus aureus: POSITIVE — AB

## 2018-05-26 NOTE — Anesthesia Preprocedure Evaluation (Addendum)
Anesthesia Evaluation  Patient identified by MRN, date of birth, ID band Patient awake    Reviewed: Allergy & Precautions, NPO status , Patient's Chart, lab work & pertinent test results  History of Anesthesia Complications (+) PONV and history of anesthetic complications  Airway Mallampati: II  TM Distance: >3 FB Neck ROM: Full    Dental no notable dental hx. (+) Dental Advisory Given   Pulmonary sleep apnea ,    Pulmonary exam normal        Cardiovascular hypertension, Pt. on medications Normal cardiovascular exam  Impressions:  - Mild to moderate LVH with LVEF 60-65%. Probable grade 1 diastolic dysfunction. MAC with trivial mitral regurgitation. Mildly dilated aortic root. Mildly calcified aortic annulus. Mild RV dilatation with normal contraction. Trivial tricuspid regurgitation with PASP estimated 23 mmHg. Mild right atrial enlargement.   Neuro/Psych negative neurological ROS     GI/Hepatic Neg liver ROS, GERD  ,  Endo/Other  Morbid obesity  Renal/GU negative Renal ROS     Musculoskeletal negative musculoskeletal ROS (+)   Abdominal   Peds  Hematology negative hematology ROS (+)   Anesthesia Other Findings Day of surgery medications reviewed with the patient.  Reproductive/Obstetrics                                                            Anesthesia Evaluation  Patient identified by MRN, date of birth, ID band Patient awake    Reviewed: Allergy & Precautions, NPO status , Patient's Chart, lab work & pertinent test results  History of Anesthesia Complications (+) PONV  Airway Mallampati: II  TM Distance: >3 FB Neck ROM: Full    Dental no notable dental hx.    Pulmonary sleep apnea ,    Pulmonary exam normal breath sounds clear to auscultation       Cardiovascular hypertension, + Peripheral Vascular Disease  Normal cardiovascular  exam Rhythm:Regular Rate:Normal     Neuro/Psych ?ALS  Neuromuscular disease negative psych ROS   GI/Hepatic negative GI ROS, Neg liver ROS,   Endo/Other  negative endocrine ROS  Renal/GU negative Renal ROS  negative genitourinary   Musculoskeletal negative musculoskeletal ROS (+)   Abdominal   Peds negative pediatric ROS (+)  Hematology negative hematology ROS (+)   Anesthesia Other Findings   Reproductive/Obstetrics negative OB ROS                             Anesthesia Physical Anesthesia Plan  ASA: III  Anesthesia Plan: General   Post-op Pain Management:    Induction: Intravenous  Airway Management Planned: Oral ETT  Additional Equipment:   Intra-op Plan:   Post-operative Plan: Extubation in OR  Informed Consent: I have reviewed the patients History and Physical, chart, labs and discussed the procedure including the risks, benefits and alternatives for the proposed anesthesia with the patient or authorized representative who has indicated his/her understanding and acceptance.   Dental advisory given  Plan Discussed with: CRNA and Surgeon  Anesthesia Plan Comments:         Anesthesia Quick Evaluation  Anesthesia Physical Anesthesia Plan  ASA: III  Anesthesia Plan: General   Post-op Pain Management:  Regional for Post-op pain   Induction: Intravenous  PONV Risk Score and Plan: 3 and  Ondansetron, Dexamethasone and Diphenhydramine  Airway Management Planned: Oral ETT  Additional Equipment:   Intra-op Plan:   Post-operative Plan: Extubation in OR  Informed Consent: I have reviewed the patients History and Physical, chart, labs and discussed the procedure including the risks, benefits and alternatives for the proposed anesthesia with the patient or authorized representative who has indicated his/her understanding and acceptance.     Dental advisory given  Plan Discussed with: CRNA, Anesthesiologist and  Surgeon  Anesthesia Plan Comments: (See PST note 05/25/18, Konrad Felix, PA-C Discussed risks and benefits of PNB (ISB) with patient given his progressive neurologic disease.  Pt is a Animal nutritionist, and voiced an understanding of the situation and  the fact that there is not much data available for cases like this for his decision.  He ask to have an ISB with exparel and we will follow him closely. We discussed this with Dr. Onnie Graham. )     Anesthesia Quick Evaluation

## 2018-05-26 NOTE — Progress Notes (Signed)
Anesthesia Chart Review   Case:  [id number] Date/Time:  05/28/18 0715   Procedure:  REVERSE SHOULDER ARTHROPLASTY (Left ) - 140mn   Anesthesia type:  General   Pre-op diagnosis:  left shoulder osteoarthritis, rotator cuff dysfunction   Location[facility]  [facility] ROOM 07 / [facility]   Surgeon:  SJustice Britain MD      DISCUSSION:73 yo never smoker with h/o PONV, HTN, OSA, RBBB, upper motor neuron disease, HLD, GERD, left shoulder OA and rotator cuff dysfunction scheduled for above surgery on 05/28/18 with Dr. KJustice Britain   AAA followed by Dr. HPercival Spanish  Ultrasound 11/12/17 measured 3.9, stable.  Repeat [place]commended in 2 years.   Cardiac clearance received on 03/09/18.  Per [facility] PA-C, "Given past medical history and time since last visit, based on ACC/AHA guidelines, GSOLAN VOSLERwould be at acceptable risk for the planned procedure without further cardiovascular testing. I will route this recommendation to the requesting party via Epic fax function and remove from pre-op pool.  Please call with questions. May hold ASA for 5 days prior to the procedure. Case has been reviewed by Dr. HPercival Spanish Per Dr. HPercival Spanish "OK to have shoulder surgery. According to ACC/AHA guidelines no further testing is indicated. CallMr.Glasscockwith the results and send results to BJinny Sanders MD".   Pt can proceed with planned procedure barring acute status change.  VS: BP 131/82 (BP Location: Left Arm)   Pulse 94   Temp 37.3 C (Oral)   Resp 18   Ht 5' 10"  (1.778 m)   Wt 113.4 kg   SpO2 96%   BMI 35.87 kg/m   PROVIDERS: BJinny Sanders MD is PCP last seen 11/25/17  HMinus Breeding MD is Cardiologist  LABS: Labs reviewed: Acceptable for surgery. (all labs ordered are listed, but only abnormal results are displayed)  Labs Reviewed  SURGICAL PCR SCREEN - Abnormal; Notable for the following components:      Result Value   Staphylococcus aureus POSITIVE (*)    All other components within normal limits   BASIC METABOLIC PANEL - Abnormal; Notable for the following components:   Glucose, Bld 108 (*)    BUN 25 (*)    All other components within normal limits  CBC     IMAGES: CT Abdomen Pelvis 06/06/2017 IMPRESSION: No ascites.  Mild fatty infiltration of the liver.  Left colonic diverticulosis.  No active diverticulitis.  Bilateral inguinal hernias, right larger than left. Small umbilical hernia. These contain fat.  3.6 cm infrarenal abdominal aortic aneurysm compared to 3.9 cm on prior ultrasound. Recommend followup by ultrasound in 2 years. This recommendation follows ACR consensus guidelines: White Paper of the ACR Incidental Findings Committee II on Vascular Findings. J Am Coll Radiol 2013; 10:789-794.  VAS [place] Duplex 11/12/17 Final Interpretation: Abdominal Aorta: There is evidence of abnormal dilatation of the Distal Abdominal aorta. The largest aortic measurement is 3.9 cm. The largest aortic diameter remains essentially unchanged compared to prior exam. Previous diameter measurement was 3.9 cm  obtained on 07/17/2017. No focal stenosis noted in the bilateral common and external iliac arteries.   *See table(s) above for measurements and observations. Suggest follow up study in 12 months.  EKG: 11/03/17 Rate 54 bpm Sinus bradycardia with 1st degree AV block Right bundle branch block Abnormal ECG   CV: Echo 08/11/2015 Study Conclusions  - Left ventricle: The cavity size was normal. Wall thickness was   increased increased in a pattern of mild to moderate LVH.  Systolic function was normal. The estimated ejection fraction was   in the range of 60% to 65%. Doppler parameters are consistent   with abnormal left ventricular relaxation (grade 1 diastolic   dysfunction). - Aortic valve: Mildly calcified annulus. Probably trileaflet. - Aortic root: The aortic root was mildly dilated. - Mitral valve: Calcified annulus. There was trivial regurgitation. -  Right ventricle: The cavity size was mildly dilated. Systolic   function was normal. - Right atrium: The atrium was mildly dilated. Central venous   pressure (est): 3 mm Hg. - Atrial septum: No defect or patent foramen ovale was identified. - Tricuspid valve: There was trivial regurgitation. - Pulmonary arteries: PA peak pressure: 23 mm Hg (S). - Pericardium, extracardiac: There was no pericardial effusion.  Impressions:  - Mild to moderate LVH with LVEF 60-65%. Probable grade 1 diastolic   dysfunction. MAC with trivial mitral regurgitation. Mildly   dilated aortic root. Mildly calcified aortic annulus. Mild RV   dilatation with normal contraction. Trivial tricuspid   regurgitation with PASP estimated 23 mmHg. Mild right atrial   enlargement. Past Medical History:  Diagnosis Date  . Arthritis   . Bradycardia   . Fatty liver   . Fracture, tibia, with fibula teenage yrs.    no surgery, wore cast  . GERD (gastroesophageal reflux disease)   . Hyperlipidemia   . Hypertension   . 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 OSA (obstructive sleep apnea) 01/22/2011  . PONV (postoperative nausea and vomiting)   . 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 Right bundle branch block   . Syncope     Past Surgical History:  Procedure Laterality Date  . CHOLECYSTECTOMY N/A 06/13/2016   Procedure: LAPAROSCOPIC CHOLECYSTECTOMY;  Surgeon: Arta Bruce Kinsinger, MD;  [facility];  Service: General;  Laterality: N/A;  . EYE SURGERY     Cataracts bil  . FOOT SURGERY  11-2008   hammer toe and bunion  . HERNIA REPAIR  12-2001   inguinal hernia bilateral  . SHOULDER ARTHROSCOPY WITH ROTATOR CUFF REPAIR AND SUBACROMIAL DECOMPRESSION Right 06/18/2012   Procedure: RIGHT SHOULDER ARTHROSCOPY WITH SUBACROMIAL DECOMPRESSION AND DISTAL CLAVICLE RESECTION AND ROTATOR CUFF REPAIR;  Surgeon: Marin Shutter, MD;  Location: [place];  Service: Orthopedics;   Laterality: Right;  . SHOULDER SURGERY  1977   Luxating     MEDICATIONS: . aspirin EC 81 MG tablet  . chlorthalidone (HYGROTON) 50 MG tablet  . Coenzyme Q10 (COQ10) 400 MG CAPS  . CRANBERRY PO  . Cyanocobalamin (VITAMIN B-12) 2500 MCG SUBL  . hydrocortisone (ANUSOL-HC) 2.5 % rectal cream  . indomethacin (INDOCIN) 25 MG capsule  . losartan (COZAAR) 50 MG tablet  . methocarbamol (ROBAXIN) 750 MG tablet  . Multiple Vitamin (MULTIVITAMIN WITH MINERALS) TABS  . Omega-3 Fatty Acids (FISH OIL TRIPLE STRENGTH) 1400 MG CAPS  . omeprazole (PRILOSEC) 20 MG capsule  . OVER THE COUNTER MEDICATION  . OVER THE COUNTER MEDICATION  . pravastatin (PRAVACHOL) 20 MG tablet  . RESVERATROL 100 MG CAPS  . tiZANidine (ZANAFLEX) 4 MG tablet  . traMADol (ULTRAM) 50 MG tablet   No current facility-administered medications for this encounter.     [facility] Pre-Surgical Testing [phone] 05/26/18 12:13 PM

## 2018-05-28 ENCOUNTER — Inpatient Hospital Stay (HOSPITAL_COMMUNITY)
Admission: RE | Admit: 2018-05-28 | Discharge: 2018-05-29 | DRG: 483 | Disposition: A | Discharge status: Home / Self Care | Payer: Medicare Other | Attending: Orthopedic Surgery | Admitting: Orthopedic Surgery

## 2018-05-28 ENCOUNTER — Encounter (HOSPITAL_COMMUNITY)
Admission: RE | Disposition: A | Discharge status: Home / Self Care | Payer: Self-pay | Source: Home / Self Care | Attending: Orthopedic Surgery

## 2018-05-28 ENCOUNTER — Inpatient Hospital Stay (HOSPITAL_COMMUNITY): Payer: Medicare Other | Admitting: Physician Assistant

## 2018-05-28 ENCOUNTER — Inpatient Hospital Stay (HOSPITAL_COMMUNITY): Payer: Medicare Other | Admitting: Anesthesiology

## 2018-05-28 ENCOUNTER — Encounter (HOSPITAL_COMMUNITY): Payer: Self-pay | Admitting: Emergency Medicine

## 2018-05-28 ENCOUNTER — Other Ambulatory Visit: Payer: Self-pay

## 2018-05-28 DIAGNOSIS — R001 Bradycardia, unspecified: Secondary | ICD-10-CM | POA: Diagnosis present

## 2018-05-28 DIAGNOSIS — M25312 Other instability, left shoulder: Secondary | ICD-10-CM | POA: Diagnosis present

## 2018-05-28 DIAGNOSIS — M75102 Unspecified rotator cuff tear or rupture of left shoulder, not specified as traumatic: Secondary | ICD-10-CM | POA: Diagnosis present

## 2018-05-28 DIAGNOSIS — Z8042 Family history of malignant neoplasm of prostate: Secondary | ICD-10-CM | POA: Diagnosis not present

## 2018-05-28 DIAGNOSIS — Z888 Allergy status to other drugs, medicaments and biological substances status: Secondary | ICD-10-CM

## 2018-05-28 DIAGNOSIS — G4733 Obstructive sleep apnea (adult) (pediatric): Secondary | ICD-10-CM | POA: Diagnosis present

## 2018-05-28 DIAGNOSIS — E7800 Pure hypercholesterolemia, unspecified: Secondary | ICD-10-CM | POA: Diagnosis not present

## 2018-05-28 DIAGNOSIS — Z823 Family history of stroke: Secondary | ICD-10-CM

## 2018-05-28 DIAGNOSIS — E785 Hyperlipidemia, unspecified: Secondary | ICD-10-CM | POA: Diagnosis present

## 2018-05-28 DIAGNOSIS — I10 Essential (primary) hypertension: Secondary | ICD-10-CM | POA: Diagnosis present

## 2018-05-28 DIAGNOSIS — K219 Gastro-esophageal reflux disease without esophagitis: Secondary | ICD-10-CM | POA: Diagnosis present

## 2018-05-28 DIAGNOSIS — Z79899 Other long term (current) drug therapy: Secondary | ICD-10-CM

## 2018-05-28 DIAGNOSIS — Z800 Family history of malignant neoplasm of digestive organs: Secondary | ICD-10-CM | POA: Diagnosis not present

## 2018-05-28 DIAGNOSIS — M19012 Primary osteoarthritis, left shoulder: Secondary | ICD-10-CM | POA: Diagnosis not present

## 2018-05-28 DIAGNOSIS — Z7982 Long term (current) use of aspirin: Secondary | ICD-10-CM | POA: Diagnosis not present

## 2018-05-28 DIAGNOSIS — Z96612 Presence of left artificial shoulder joint: Secondary | ICD-10-CM

## 2018-05-28 DIAGNOSIS — Z6835 Body mass index (BMI) 35.0-35.9, adult: Secondary | ICD-10-CM | POA: Diagnosis not present

## 2018-05-28 DIAGNOSIS — Z791 Long term (current) use of non-steroidal anti-inflammatories (NSAID): Secondary | ICD-10-CM

## 2018-05-28 DIAGNOSIS — G8918 Other acute postprocedural pain: Secondary | ICD-10-CM | POA: Diagnosis not present

## 2018-05-28 DIAGNOSIS — Z8249 Family history of ischemic heart disease and other diseases of the circulatory system: Secondary | ICD-10-CM

## 2018-05-28 DIAGNOSIS — G1223 Primary lateral sclerosis: Secondary | ICD-10-CM | POA: Diagnosis not present

## 2018-05-28 HISTORY — PX: REVERSE SHOULDER ARTHROPLASTY: SHX5054

## 2018-05-28 SURGERY — ARTHROPLASTY, SHOULDER, TOTAL, REVERSE
Anesthesia: General | Site: Shoulder | Laterality: Left

## 2018-05-28 MED ORDER — RESVERATROL 100 MG PO CAPS: 300.0000 mg | ORAL_CAPSULE | Freq: Every day | ORAL | Status: DC

## 2018-05-28 MED ORDER — DEXAMETHASONE SODIUM PHOSPHATE 10 MG/ML IJ SOLN
INTRAMUSCULAR | Status: DC | PRN
  Administered 2018-05-28: 5 mg via INTRAVENOUS

## 2018-05-28 MED ORDER — DIPHENHYDRAMINE HCL 12.5 MG/5ML PO ELIX: 12.5000 mg | ORAL_SOLUTION | ORAL | Status: DC | PRN

## 2018-05-28 MED ORDER — MENTHOL 3 MG MT LOZG: 1.0000 | LOZENGE | OROMUCOSAL | Status: DC | PRN

## 2018-05-28 MED ORDER — LIDOCAINE 2% (20 MG/ML) 5 ML SYRINGE
INTRAMUSCULAR | Status: DC | PRN
  Administered 2018-05-28: 60 mg via INTRAVENOUS

## 2018-05-28 MED ORDER — PHENYLEPHRINE 40 MCG/ML (10ML) SYRINGE FOR IV PUSH (FOR BLOOD PRESSURE SUPPORT)
PREFILLED_SYRINGE | INTRAVENOUS | Status: DC | PRN
  Administered 2018-05-28 (×2): 120 ug via INTRAVENOUS

## 2018-05-28 MED ORDER — ROCURONIUM BROMIDE 100 MG/10ML IV SOLN
INTRAVENOUS | Status: AC
  Filled 2018-05-28: qty 1

## 2018-05-28 MED ORDER — TRANEXAMIC ACID-NACL 1000-0.7 MG/100ML-% IV SOLN
1000.0000 mg | INTRAVENOUS | Status: DC
  Filled 2018-05-28: qty 100

## 2018-05-28 MED ORDER — ALUM & MAG HYDROXIDE-SIMETH 200-200-20 MG/5ML PO SUSP: 30.0000 mL | ORAL | Status: DC | PRN

## 2018-05-28 MED ORDER — LOSARTAN POTASSIUM 50 MG PO TABS: 50.0000 mg | ORAL_TABLET | Freq: Every day | ORAL | Status: DC

## 2018-05-28 MED ORDER — OXYCODONE HCL 5 MG PO TABS: 10.0000 mg | ORAL_TABLET | ORAL | Status: DC | PRN

## 2018-05-28 MED ORDER — ONDANSETRON HCL 4 MG/2ML IJ SOLN
INTRAMUSCULAR | Status: DC | PRN
  Administered 2018-05-28: 4 mg via INTRAVENOUS

## 2018-05-28 MED ORDER — KETOROLAC TROMETHAMINE 15 MG/ML IJ SOLN
7.5000 mg | Freq: Four times a day (QID) | INTRAMUSCULAR | Status: AC
  Administered 2018-05-28 – 2018-05-29 (×4): 7.5 mg via INTRAVENOUS
  Filled 2018-05-28 (×4): qty 1

## 2018-05-28 MED ORDER — PROPOFOL 10 MG/ML IV BOLUS
INTRAVENOUS | Status: DC | PRN
  Administered 2018-05-28: 120 mg via INTRAVENOUS
  Administered 2018-05-28: 160 mg via INTRAVENOUS
  Administered 2018-05-28: 40 mg via INTRAVENOUS

## 2018-05-28 MED ORDER — HYDROMORPHONE HCL 1 MG/ML IJ SOLN: 0.5000 mg | INTRAMUSCULAR | Status: DC | PRN

## 2018-05-28 MED ORDER — MAGNESIUM CITRATE PO SOLN: 1.0000 | Freq: Once | ORAL | Status: DC | PRN

## 2018-05-28 MED ORDER — ASPIRIN EC 81 MG PO TBEC: 81.0000 mg | DELAYED_RELEASE_TABLET | Freq: Every day | ORAL | Status: DC

## 2018-05-28 MED ORDER — METOCLOPRAMIDE HCL 5 MG/ML IJ SOLN: 5.0000 mg | Freq: Three times a day (TID) | INTRAMUSCULAR | Status: DC | PRN

## 2018-05-28 MED ORDER — ONDANSETRON HCL 4 MG PO TABS: 4.0000 mg | ORAL_TABLET | Freq: Four times a day (QID) | ORAL | Status: DC | PRN

## 2018-05-28 MED ORDER — FENTANYL CITRATE (PF) 250 MCG/5ML IJ SOLN
INTRAMUSCULAR | Status: AC
  Filled 2018-05-28: qty 5

## 2018-05-28 MED ORDER — PROPOFOL 10 MG/ML IV BOLUS
INTRAVENOUS | Status: AC
  Filled 2018-05-28: qty 20

## 2018-05-28 MED ORDER — METHOCARBAMOL 500 MG IVPB - SIMPLE MED
500.0000 mg | Freq: Four times a day (QID) | INTRAVENOUS | Status: DC | PRN
  Filled 2018-05-28: qty 50

## 2018-05-28 MED ORDER — ASPIRIN EC 81 MG PO TBEC
81.0000 mg | DELAYED_RELEASE_TABLET | Freq: Every day | ORAL | Status: DC
  Administered 2018-05-28 – 2018-05-29 (×2): 81 mg via ORAL
  Filled 2018-05-28 (×2): qty 1

## 2018-05-28 MED ORDER — CEFAZOLIN SODIUM-DEXTROSE 2-4 GM/100ML-% IV SOLN
2.0000 g | INTRAVENOUS | Status: AC
  Administered 2018-05-28: 2 g via INTRAVENOUS

## 2018-05-28 MED ORDER — ACETAMINOPHEN 500 MG PO TABS
ORAL_TABLET | ORAL | Status: AC
  Filled 2018-05-28: qty 2

## 2018-05-28 MED ORDER — TRAMADOL HCL 50 MG PO TABS: 50.0000 mg | ORAL_TABLET | Freq: Four times a day (QID) | ORAL | Status: DC | PRN

## 2018-05-28 MED ORDER — FENTANYL CITRATE (PF) 100 MCG/2ML IJ SOLN
INTRAMUSCULAR | Status: DC | PRN
  Administered 2018-05-28 (×2): 50 ug via INTRAVENOUS

## 2018-05-28 MED ORDER — BUPIVACAINE LIPOSOME 1.3 % IJ SUSP
INTRAMUSCULAR | Status: DC | PRN
  Administered 2018-05-28: 10 mL via PERINEURAL

## 2018-05-28 MED ORDER — STERILE WATER FOR IRRIGATION IR SOLN
Status: DC | PRN
  Administered 2018-05-28: 2000 mL

## 2018-05-28 MED ORDER — SODIUM CHLORIDE 0.9 % IV BOLUS
1000.0000 mL | Freq: Once | INTRAVENOUS | Status: AC
  Administered 2018-05-28: 1000 mL via INTRAVENOUS

## 2018-05-28 MED ORDER — BISACODYL 5 MG PO TBEC: 5.0000 mg | DELAYED_RELEASE_TABLET | Freq: Every day | ORAL | Status: DC | PRN

## 2018-05-28 MED ORDER — PANTOPRAZOLE SODIUM 40 MG PO TBEC
40.0000 mg | DELAYED_RELEASE_TABLET | Freq: Every day | ORAL | Status: DC
  Administered 2018-05-29: 40 mg via ORAL
  Filled 2018-05-28: qty 1

## 2018-05-28 MED ORDER — ACETAMINOPHEN 325 MG PO TABS: 325.0000 mg | ORAL_TABLET | Freq: Four times a day (QID) | ORAL | Status: DC | PRN

## 2018-05-28 MED ORDER — METHOCARBAMOL 500 MG PO TABS: 750.0000 mg | ORAL_TABLET | Freq: Four times a day (QID) | ORAL | Status: DC | PRN

## 2018-05-28 MED ORDER — CHLORTHALIDONE 50 MG PO TABS
50.0000 mg | ORAL_TABLET | Freq: Every day | ORAL | Status: DC
  Filled 2018-05-28 (×2): qty 1

## 2018-05-28 MED ORDER — METOCLOPRAMIDE HCL 5 MG PO TABS: 5.0000 mg | ORAL_TABLET | Freq: Three times a day (TID) | ORAL | Status: DC | PRN

## 2018-05-28 MED ORDER — DIPHENHYDRAMINE HCL 50 MG/ML IJ SOLN
INTRAMUSCULAR | Status: AC
  Filled 2018-05-28: qty 1

## 2018-05-28 MED ORDER — ACETAMINOPHEN 500 MG PO TABS
1000.0000 mg | ORAL_TABLET | Freq: Once | ORAL | Status: AC
  Administered 2018-05-28: 1000 mg via ORAL

## 2018-05-28 MED ORDER — SODIUM CHLORIDE 0.9 % IR SOLN
Status: DC | PRN
  Administered 2018-05-28: 1000 mL

## 2018-05-28 MED ORDER — OXYCODONE HCL 5 MG PO TABS: 5.0000 mg | ORAL_TABLET | ORAL | Status: DC | PRN

## 2018-05-28 MED ORDER — LACTATED RINGERS IV SOLN
INTRAVENOUS | Status: DC
  Administered 2018-05-28 (×2): via INTRAVENOUS

## 2018-05-28 MED ORDER — FENTANYL CITRATE (PF) 100 MCG/2ML IJ SOLN: 25.0000 ug | INTRAMUSCULAR | Status: DC | PRN

## 2018-05-28 MED ORDER — DIPHENHYDRAMINE HCL 50 MG/ML IJ SOLN
INTRAMUSCULAR | Status: DC | PRN
  Administered 2018-05-28: 12.5 mg via INTRAVENOUS

## 2018-05-28 MED ORDER — DOCUSATE SODIUM 100 MG PO CAPS
100.0000 mg | ORAL_CAPSULE | Freq: Two times a day (BID) | ORAL | Status: DC
  Administered 2018-05-28 – 2018-05-29 (×2): 100 mg via ORAL
  Filled 2018-05-28 (×2): qty 1

## 2018-05-28 MED ORDER — CHLORHEXIDINE GLUCONATE 4 % EX LIQD: 60.0000 mL | Freq: Once | CUTANEOUS | Status: DC

## 2018-05-28 MED ORDER — GLYCOPYRROLATE PF 0.2 MG/ML IJ SOSY
PREFILLED_SYRINGE | INTRAMUSCULAR | Status: AC
  Filled 2018-05-28: qty 1

## 2018-05-28 MED ORDER — BUPIVACAINE HCL (PF) 0.5 % IJ SOLN
INTRAMUSCULAR | Status: DC | PRN
  Administered 2018-05-28: 15 mL via PERINEURAL

## 2018-05-28 MED ORDER — EPHEDRINE SULFATE-NACL 50-0.9 MG/10ML-% IV SOSY
PREFILLED_SYRINGE | INTRAVENOUS | Status: DC | PRN
  Administered 2018-05-28 (×2): 10 mg via INTRAVENOUS

## 2018-05-28 MED ORDER — ONDANSETRON HCL 4 MG/2ML IJ SOLN: 4.0000 mg | Freq: Four times a day (QID) | INTRAMUSCULAR | Status: DC | PRN

## 2018-05-28 MED ORDER — PHENOL 1.4 % MT LIQD: 1.0000 | OROMUCOSAL | Status: DC | PRN

## 2018-05-28 MED ORDER — SODIUM CHLORIDE 0.9 % IV BOLUS
500.0000 mL | Freq: Once | INTRAVENOUS | Status: AC
  Administered 2018-05-28: 500 mL via INTRAVENOUS

## 2018-05-28 MED ORDER — POLYETHYLENE GLYCOL 3350 17 G PO PACK: 17.0000 g | PACK | Freq: Every day | ORAL | Status: DC | PRN

## 2018-05-28 MED ORDER — PROMETHAZINE HCL 25 MG/ML IJ SOLN: 6.2500 mg | INTRAMUSCULAR | Status: DC | PRN

## 2018-05-28 MED ORDER — LACTATED RINGERS IV SOLN
INTRAVENOUS | Status: DC
  Administered 2018-05-28 (×3): via INTRAVENOUS

## 2018-05-28 MED ORDER — SODIUM CHLORIDE 0.9 % IV SOLN
INTRAVENOUS | Status: DC | PRN
  Administered 2018-05-28: 40 ug/min via INTRAVENOUS

## 2018-05-28 SURGICAL SUPPLY — 67 items
ADH SKN CLS APL DERMABOND .7 (GAUZE/BANDAGES/DRESSINGS) ×1
BAG SPEC THK2 15X12 ZIP CLS (MISCELLANEOUS) ×1
BAG ZIPLOCK 12X15 (MISCELLANEOUS) ×3 IMPLANT
BLADE SAW SGTL 83.5X18.5 (BLADE) ×3 IMPLANT
BSPLAT GLND +2X24 MDLR (Joint) ×1 IMPLANT
CLOSURE WOUND 1/2 X4 (GAUZE/BANDAGES/DRESSINGS)
COVER SURGICAL LIGHT HANDLE (MISCELLANEOUS) ×3 IMPLANT
COVER WAND RF STERILE (DRAPES) IMPLANT
CUP SUT UNIV REVERS 39 NEU (Shoulder) ×2 IMPLANT
DERMABOND ADVANCED (GAUZE/BANDAGES/DRESSINGS) ×2
DERMABOND ADVANCED .7 DNX12 (GAUZE/BANDAGES/DRESSINGS) ×1 IMPLANT
DRAPE INCISE IOBAN 66X45 STRL (DRAPES) ×2 IMPLANT
DRAPE ORTHO SPLIT 77X108 STRL (DRAPES) ×6
DRAPE SURG 17X11 SM STRL (DRAPES) ×3 IMPLANT
DRAPE SURG ORHT 6 SPLT 77X108 (DRAPES) ×2 IMPLANT
DRAPE U-SHAPE 47X51 STRL (DRAPES) ×3 IMPLANT
DRESSING AQUACEL AG SP 3.5X10 (GAUZE/BANDAGES/DRESSINGS) IMPLANT
DRSG AQUACEL AG ADV 3.5X10 (GAUZE/BANDAGES/DRESSINGS) ×3 IMPLANT
DRSG AQUACEL AG SP 3.5X10 (GAUZE/BANDAGES/DRESSINGS) ×3
DURAPREP 26ML APPLICATOR (WOUND CARE) ×5 IMPLANT
ELECT BLADE TIP CTD 4 INCH (ELECTRODE) ×3 IMPLANT
ELECT REM PT RETURN 15FT ADLT (MISCELLANEOUS) ×3 IMPLANT
FACESHIELD WRAPAROUND (MASK) ×9 IMPLANT
FACESHIELD WRAPAROUND OR TEAM (MASK) ×3 IMPLANT
GLENOID UNI REV MOD 24 +2 LAT (Joint) ×2 IMPLANT
GLENOSPHERE 39+4 LAT/24 UNI RV (Joint) ×2 IMPLANT
GLOVE BIO SURGEON STRL SZ7.5 (GLOVE) ×3 IMPLANT
GLOVE BIO SURGEON STRL SZ8 (GLOVE) ×3 IMPLANT
GLOVE SS BIOGEL STRL SZ 7 (GLOVE) ×1 IMPLANT
GLOVE SS BIOGEL STRL SZ 7.5 (GLOVE) ×1 IMPLANT
GLOVE SUPERSENSE BIOGEL SZ 7 (GLOVE) ×2
GLOVE SUPERSENSE BIOGEL SZ 7.5 (GLOVE) ×2
GOWN STRL REUS W/TWL LRG LVL3 (GOWN DISPOSABLE) ×6 IMPLANT
INSERT HUMERAL MED 39/ +3 (Shoulder) IMPLANT
INSERT MEDIUM HUMERAL 39/ +3 (Shoulder) ×2 IMPLANT
KIT BASIN OR (CUSTOM PROCEDURE TRAY) ×3 IMPLANT
MANIFOLD NEPTUNE II (INSTRUMENTS) ×3 IMPLANT
NDL TAPERED W/ NITINOL LOOP (MISCELLANEOUS) IMPLANT
NEEDLE TAPERED W/ NITINOL LOOP (MISCELLANEOUS) ×3 IMPLANT
NS IRRIG 1000ML POUR BTL (IV SOLUTION) ×3 IMPLANT
PACK SHOULDER (CUSTOM PROCEDURE TRAY) ×3 IMPLANT
PAD ARMBOARD 7.5X6 YLW CONV (MISCELLANEOUS) IMPLANT
PIN SET MODULAR GLENOID SYSTEM (PIN) ×2 IMPLANT
PROTECTOR NERVE ULNAR (MISCELLANEOUS) ×3 IMPLANT
SCREW CENTRAL MOD 35 (Screw) ×2 IMPLANT
SCREW PERI LOCK 5.5X16 (Screw) ×2 IMPLANT
SCREW PERI LOCK 5.5X32 (Screw) ×2 IMPLANT
SCREW PERI LOCK 5.5X36 (Screw) ×2 IMPLANT
SCREW PERIPHERAL 5.5X20 LOCK (Screw) ×2 IMPLANT
SLING ARM FOAM STRAP LRG (SOFTGOODS) ×2 IMPLANT
SLING ARM IMMOBILIZER LRG (SOFTGOODS) IMPLANT
SLING ARM IMMOBILIZER MED (SOFTGOODS) IMPLANT
SPACER REVERSE UNI 39/ +6MM (Shoulder) ×1 IMPLANT
SPACER SHLD UNI REV 39 +6 (Shoulder) ×1 IMPLANT
SPONGE LAP 18X18 RF (DISPOSABLE) IMPLANT
STEM HUMERAL UNI REVERS SZ9 (Stem) ×2 IMPLANT
STRIP CLOSURE SKIN 1/2X4 (GAUZE/BANDAGES/DRESSINGS) IMPLANT
SUCTION FRAZIER HANDLE 12FR (TUBING) ×2
SUCTION TUBE FRAZIER 12FR DISP (TUBING) ×1 IMPLANT
SUT MNCRL AB 3-0 PS2 18 (SUTURE) ×3 IMPLANT
SUT MON AB 2-0 CT1 36 (SUTURE) ×3 IMPLANT
SUT VIC AB 1 CT1 36 (SUTURE) ×3 IMPLANT
SUTURE TAPE 1.3 40 TPR END (SUTURE) ×1 IMPLANT
SUTURETAPE 1.3 40 TPR END (SUTURE) ×6
TOWEL OR 17X26 10 PK STRL BLUE (TOWEL DISPOSABLE) ×6 IMPLANT
TOWEL OR NON WOVEN STRL DISP B (DISPOSABLE) ×3 IMPLANT
WATER STERILE IRR 1000ML POUR (IV SOLUTION) ×6 IMPLANT

## 2018-05-28 NOTE — Anesthesia Postprocedure Evaluation (Signed)
Anesthesia Post Note  Patient: Edward Frank  Procedure(s) Performed: REVERSE SHOULDER ARTHROPLASTY (Left Shoulder)     Patient location during evaluation: PACU Anesthesia Type: General Level of consciousness: sedated Pain management: pain level controlled Vital Signs Assessment: post-procedure vital signs reviewed and stable Respiratory status: spontaneous breathing and respiratory function stable Cardiovascular status: stable Postop Assessment: no apparent nausea or vomiting Anesthetic complications: no    Last Vitals:  Vitals:   05/28/18 1015 05/28/18 1030  BP: 130/90 121/78  Pulse: 81 80  Resp: 18 20  Temp:    SpO2: 97% 98%    Last Pain:  Vitals:   05/28/18 1030  TempSrc:   PainSc: 0-No pain                 Jordin Vicencio DANIEL

## 2018-05-28 NOTE — H&P (Signed)
Edward Frank    Chief Complaint: left shoulder osteoarthritis, rotator cuff dysfunction HPI: The patient is a 73 y.o. male with end stage left shoulder OA  Past Medical History:  Diagnosis Date  . Arthritis   . Bradycardia   . Fatty liver   . Fracture, tibia, with fibula teenage yrs.    no surgery, wore cast  . GERD (gastroesophageal reflux disease)   . Hyperlipidemia   . Hypertension   . 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 OSA (obstructive sleep apnea) 01/22/2011  . PONV (postoperative nausea and vomiting)   . 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 Right bundle branch block   . Syncope     Past Surgical History:  Procedure Laterality Date  . CHOLECYSTECTOMY N/A 06/13/2016   Procedure: LAPAROSCOPIC CHOLECYSTECTOMY;  Surgeon: Arta Bruce Kinsinger, MD;  [facility];  Service: General;  Laterality: N/A;  . EYE SURGERY     Cataracts bil  . FOOT SURGERY  11-2008   hammer toe and bunion  . HERNIA REPAIR  12-2001   inguinal hernia bilateral  . SHOULDER ARTHROSCOPY WITH ROTATOR CUFF REPAIR AND SUBACROMIAL DECOMPRESSION Right 06/18/2012   Procedure: RIGHT SHOULDER ARTHROSCOPY WITH SUBACROMIAL DECOMPRESSION AND DISTAL CLAVICLE RESECTION AND ROTATOR CUFF REPAIR;  Surgeon: Marin Shutter, MD;  Location: [place];  Service: Orthopedics;  Laterality: Right;  . SHOULDER SURGERY  1977   Luxating     Family History  Problem Relation Age of Onset  . Heart disease Mother        ? afib and got pacemaker  . Uterine cancer Mother   . Prostate cancer Father   . Hypertension Father   . Kidney failure Father   . Stroke Father   . Colon cancer Sister 23    Social History:  reports that he has never smoked. He has never used smokeless tobacco. He reports current alcohol use. He reports that he does not use drugs.   Medications Prior to Admission  Medication Sig Dispense Refill  . aspirin EC 81 MG tablet Take 81 mg by mouth daily.     . chlorthalidone (HYGROTON) 50 MG tablet TAKE 1 TABLET BY MOUTH ONCE DAILY (Patient taking differently: Take 50 mg by mouth daily. ) 90 tablet 2  . Coenzyme Q10 (COQ10) 400 MG CAPS Take 400 mg by mouth daily.    [name] CRANBERRY PO Take 1 capsule by mouth 2 (two) times daily.     . Cyanocobalamin (VITAMIN B-12) 2500 MCG SUBL Place 2,500 mcg under the tongue daily.    . indomethacin (INDOCIN) 25 MG capsule Take 25 mg by mouth daily.     [name] losartan (COZAAR) 50 MG tablet Take 1 tablet (50 mg total) by mouth daily. 90 tablet 3  . methocarbamol (ROBAXIN) 750 MG tablet Take 750 mg by mouth 2 (two) times daily.     . Multiple Vitamin (MULTIVITAMIN WITH MINERALS) TABS Take 1 tablet by mouth daily.    . Omega-3 Fatty Acids (FISH OIL TRIPLE STRENGTH) 1400 MG CAPS Take 2,800 mg by mouth 2 (two) times daily.     [name] omeprazole (PRILOSEC) 20 MG capsule Take 40 mg by mouth daily.     [name] OVER THE COUNTER MEDICATION Take 1 tablet by mouth daily. Calcium citrate+D3 500-800 mg    . OVER THE COUNTER MEDICATION Apply 1 application topically as needed (dry skin/brusing). dermon for dry skin/brusing    . pravastatin (PRAVACHOL)  20 MG tablet TAKE 1 TABLET BY MOUTH AT BEDTIME (Patient taking differently: Take 20 mg by mouth daily. ) 90 tablet 3  . RESVERATROL 100 MG CAPS Take 300 mg by mouth daily.     [name] tiZANidine (ZANAFLEX) 4 MG tablet Take 4 mg by mouth at bedtime as needed for muscle spasms.     . traMADol (ULTRAM) 50 MG tablet Take 50 mg by mouth 2 (two) times daily.     . hydrocortisone (ANUSOL-HC) 2.5 % rectal cream Place 1 application rectally 2 (two) times daily. (Patient not taking: Reported on 05/25/2018) 30 g 0     Physical Exam: Left shoulder with painful and restricted motion as noted at recent office visits  Vitals  Temp:  [98.1 F (36.7 C)] 98.1 F (36.7 C) (02/06 0544) Pulse Rate:  [90] 90 (02/06 0544) BP: (146)/(96) 146/96 (02/06 0544) SpO2:  [96 %] 96 % (02/06 0544)  Assessment/Plan  Impression:  left shoulder osteoarthritis, rotator cuff dysfunction  Plan of Action: Procedure(s): REVERSE SHOULDER ARTHROPLASTY  Adriene Padula M Nico Rogness 05/28/2018, 6:07 AM Contact # [phone]

## 2018-05-28 NOTE — Anesthesia Procedure Notes (Signed)
Procedure Name: Intubation Performed by: Gean Maidens, CRNA Pre-anesthesia Checklist: Patient identified, Emergency Drugs available, Suction available, Patient being monitored and Timeout performed Patient Re-evaluated:Patient Re-evaluated prior to induction Oxygen Delivery Method: Circle system utilized Preoxygenation: Pre-oxygenation with 100% oxygen Induction Type: IV induction Ventilation: Mask ventilation without difficulty Laryngoscope Size: Mac and 4 Grade View: Grade I Tube type: Oral Tube size: 7.5 mm Number of attempts: 2 Airway Equipment and Method: Stylet Placement Confirmation: ETT inserted through vocal cords under direct vision,  positive ETCO2 and breath sounds checked- equal and bilateral Secured at: 23 cm Tube secured with: Tape Dental Injury: Teeth and Oropharynx as per pre-operative assessment

## 2018-05-28 NOTE — Transfer of Care (Signed)
Immediate Anesthesia Transfer of Care Note  Patient: Edward Frank  Procedure(s) Performed: REVERSE SHOULDER ARTHROPLASTY (Left Shoulder)  Patient Location: PACU  Anesthesia Type:General  Level of Consciousness: awake, alert  and oriented  Airway & Oxygen Therapy: Patient Spontanous Breathing and Patient connected to face mask oxygen  Post-op Assessment: Report given to RN and Post -op Vital signs reviewed and stable  Post vital signs: Reviewed and stable  Last Vitals:  Vitals Value Taken Time  BP    Temp    Pulse 83 05/28/2018  9:41 AM  Resp 14 05/28/2018  9:41 AM  SpO2 97 % 05/28/2018  9:41 AM  Vitals shown include unvalidated device data.  Last Pain:  Vitals:   05/28/18 0612  TempSrc:   PainSc: 2       Patients Stated Pain Goal: 4 (93/90/30 0923)  Complications: No apparent anesthesia complications

## 2018-05-28 NOTE — Anesthesia Procedure Notes (Signed)
Anesthesia Regional Block: Interscalene brachial plexus block   Pre-Anesthetic Checklist: ,, timeout performed, Correct Patient, Correct Site, Correct Laterality, Correct Procedure, Correct Position, site marked, Risks and benefits discussed,  Surgical consent,  Pre-op evaluation,  At surgeon's request and post-op pain management  Laterality: Left  Prep: chloraprep       Needles:  Injection technique: Single-shot  Needle Type: Echogenic Stimulator Needle     Needle Length: 5cm  Needle Gauge: 22     Additional Needles:   Narrative:  Start time: 05/28/2018 7:02 AM End time: 05/28/2018 7:12 AM Injection made incrementally with aspirations every 5 mL.  Performed by: Personally  Anesthesiologist: Duane Boston, MD  Additional Notes: Functioning IV was confirmed and monitors applied.  A 82mm 22ga echogenic arrow stimulator was used. Sterile prep and drape,hand hygiene and sterile gloves were used.Ultrasound guidance: relevant anatomy identified, needle position confirmed, local anesthetic spread visualized around nerve(s)., vascular puncture avoided.  Image printed for medical record.  Negative aspiration and negative test dose prior to incremental administration of local anesthetic. The patient tolerated the procedure well.

## 2018-05-28 NOTE — OR Nursing (Signed)
Left shoulder screw removed and given to patient.release form signed.j.Vincenza Dail,rn

## 2018-05-28 NOTE — Discharge Instructions (Signed)
° °Kevin M. Supple, M.D., F.A.A.O.S. °Orthopaedic Surgery °Specializing in Arthroscopic and Reconstructive °Surgery of the Shoulder °[phone] °[address] - [place], [place] [address] - Fax [phone] ° ° °POST-OP TOTAL SHOULDER REPLACEMENT INSTRUCTIONS ° °1. Call the office at [phone] to schedule your first post-op appointment 10-14 days from the date of your surgery. ° °2. The bandage over your incision is waterproof. You may begin showering with this dressing on. You may leave this dressing on until first follow up appointment within 2 weeks. We prefer you leave this dressing in place until follow up however after 5-7 days if you are having itching or skin irritation and would like to remove it you may do so. Go slow and tug at the borders gently to break the bond the dressing has with the skin. At this point if there is no drainage it is okay to go without a bandage or you may cover it with a light guaze and tape. You can also expect significant bruising around your shoulder that will drift down your arm and into your chest wall. This is very normal and should resolve over several days. ° ° 3. Wear your sling/immobilizer at all times except to perform the exercises below or to occasionally let your arm dangle by your side to stretch your elbow. You also need to sleep in your sling immobilizer until instructed otherwise. It is ok to remove your sling if you are sitting in a controlled environment and allow your arm to rest in a position of comfort by your side or on your lap with pillows to give your neck and skin a break from the sling. You may remove it to allow arm to dangle by side to shower. If you are up walking around and when you go to sleep at night you need to wear it. ° °4. Range of motion to your elbow, wrist, and hand are encouraged 3-5 times daily. Exercise to your hand and fingers helps to reduce swelling you may experience. ° °5. Utilize ice to the shoulder 3-5 times  minimum a day and additionally if you are experiencing pain. ° °6. Prescriptions for a pain medication and a muscle relaxant are provided for you. It is recommended that if you are experiencing pain that you pain medication alone is not controlling, add the muscle relaxant along with the pain medication which can give additional pain relief. The first 1-2 days is generally the most severe of your pain and then should gradually decrease. As your pain lessens it is recommended that you decrease your use of the pain medications to an "as needed basis'" only and to always comply with the recommended dosages of the pain medications. ° °7. Pain medications can produce constipation along with their use. If you experience this, the use of an over the counter stool softener or laxative daily is recommended.  ° °8. For additional questions or concerns, please do not hesitate to call the office. If after hours there is an answering service to forward your concerns to the physician on call. ° °9.Pain control following an exparel block ° °To help control your post-operative pain you received a nerve block  performed with Exparel which is a long acting anesthetic (numbing agent) which can provide pain relief and sensations of numbness (and relief of pain) in the operative shoulder and arm for up to 3 days. Sometimes it provides mixed relief, meaning you may still have numbness in certain areas of the arm but can still   be able to move  parts of that arm, hand, and fingers. We recommend that your prescribed pain medications  be used as needed. We do not feel it is necessary to "pre medicate" and "stay ahead" of pain.  Taking narcotic pain medications when you are not having any pain can lead to unnecessary and potentially dangerous side effects.   ° °POST-OP EXERCISES ° °Pendulum Exercises ° °Perform pendulum exercises while standing and bending at the waist. Support your uninvolved arm on a table or chair and allow your operated  arm to hang freely. Make sure to do these exercises passively - not using you shoulder muscles. ° °Repeat 20 times. Do 3 sessions per day. ° ° ° ° °

## 2018-05-28 NOTE — Op Note (Signed)
05/28/2018  9:27 AM  PATIENT:   Edward Frank  73 y.o. male  PRE-OPERATIVE DIAGNOSIS:  left shoulder osteoarthritis, rotator cuff dysfunction  POST-OPERATIVE DIAGNOSIS:  same  PROCEDURE:  1.  Left shoulder reverse arthroplasty utilizing a press-fit size 9 Arthrex stem, with a +6 spacer, +3 polyethylene insert, 39/+4 glenosphere, small baseplate  2.  Removal of retained hardware from the left glenoid in the form of a cortical screw  SURGEON:  Siniya Lichty, Metta Clines. M.D.  ASSISTANTS: Jenetta Loges, PA-C  ANESTHESIA:   General endotracheal and interscalene block with Exparel  EBL: 150 cc  SPECIMEN: None.  The removed screw was provided to the patient and his wife upon their informed consent and request  Drains: None   PATIENT DISPOSITION:  PACU - hemodynamically stable.    PLAN OF CARE: Admit for overnight observation  Brief history:  Patient is a 73 year old male with chronic and progressively increasing left shoulder pain related to posttraumatic arthritis.  He has a remote history of an open surgical stabilization for the treatment of recurrent shoulder instability.  He now has evidence for end-stage osteoarthritis and is brought to the operating this time for planned reverse shoulder arthroplasty  Preoperatively and counseled the patient regarding treatment options as well as the potential risks versus benefits thereof.  Possible surgical complications were reviewed including bleeding, infection, neurovascular injury, persistent pain, loss of motion, failure the implant, anesthetic complication, and possible need for additional surgery.  He understands and accepts and agrees with our planned procedure.  Procedure in detail:  After undergoing routine preop evaluation patient received prophylactic antibiotics and an interscalene block with Exparel was established in the holding area by the anesthesia department.  Placed supine on the operating table and underwent the smooth  induction of a general endotracheal anesthesia.  Placed into the beachchair position and appropriately padded and protected.  The left shoulder girdle region was sterilely prepped and draped in standard fashion.  Timeout was called.  An anterior deltopectoral approach was made to the left shoulder with the initial portion following his previous incision and then in extension laterally distally total length approximately 12 cm.  Skin flaps were elevated dissection carried deeply deltopectoral interval was then developed from proximal to distal.  Conjoined tendon was identified and retracted medially.  Long head biceps tendon was then tenodesed at the upper border of the pectoralis major tendon and was then unroofed and excised proximally.  Subscapularis was then split superiorly to the base of the coracoid and then was dissected from the lesser tuberosity using a "peel" technique and then the free margin was tagged with suture tape sutures.  Capsular attachments were then divided from the anterior and infra margins of the humeral neck and humeral head was delivered through the wound.  Humeral head resection was then outlined with the extra medullary guide this was then performed with an oscillating saw and then remnants of osteophyte removed from the anterior and inferior margins of the humeral neck.  Metal cap placed over the cut proximal humeral surface we then exposed the glenoid with appropriate retractors gaining circumferential exposure.  A circumferential labral resection was then completed.  A guidepin was then directed into the center of the glenoid with an approximately 10 degree inferior tilt and the glenoid was then reamed with our central reamer.  As we used the peripheral reamer it became clear that the retained metal screw in the inferior aspect of the glenoid was impacting her ability to complete preparation of  the glenoid.  With this finding we went ahead and identified the head of the retained screw  carefully remove the surrounding bone and then used a "X" screwdriver to remove the screw without difficulty.  This point we then proceeded with preparation of the glenoid reaming the service to a smooth bony surface and then were able to remove several large osteophytes from the inferior and posterior margins of the glenoid.  Terminal glenoid preparation was then completed and the central lag screw was tapped our implant was then assembled and a 35 mm lag screw with baseplate was then inserted with excellent fit and fixation.  The peripheral locking screws were all then drilled and seated with excellent fit and fixation.  The final 39/+4 glenosphere was then impacted onto the baseplate and the locking screw was placed centrally.  We then returned our attention back to the proximal humerus where the canal was hand reamed up to size 7 and broached up to size 9 and then metaphysis was prepared with the central reamer.  A trial reduction showed good soft tissue balance.  At this point the final implant was then assembled and then impacted into the humeral canal with excellent fit and fixation.  This point we then placed a series of trial implants and ultimately felt that a total of +9 off the stem gave [place] the best soft tissue balance.  With this we then applied a +6 spacer and a +3 polyethylene insert.  This point a final reduction was then completed.  We are very pleased with the overall shoulder motion and stability and soft tissue balance.  Wound was copiously irrigated.  Hemostasis was obtained.  The subscapularis was then repaired back to the metaphysis using the eyelets on the collar of our humeral stem.  The deltopectoral interval was then reapproximated with a series of figure-of-eight #1 Vicryl sutures.  2-0 Vicryl used for the subcu layer and intracuticular 3-0 Monocryl for the skin followed by Dermabond and Aquasol dressing.  Left arm was then placed into a sling and the patient was awakened, extubated,  taken recovery in stable condition.  Jenetta Loges, PA-C was used as an Environmental consultant throughout this case essential for help with positioning of the patient, positioning of extremity, tissue manipulation, implantation of the prosthesis, wound closure, and intraoperative decision-making.  Metta Clines Subrina Vecchiarelli MD   Contact # [phone]

## 2018-05-29 ENCOUNTER — Encounter (HOSPITAL_COMMUNITY): Payer: Self-pay | Admitting: Orthopedic Surgery

## 2018-05-29 MED ORDER — ALBUTEROL SULFATE (2.5 MG/3ML) 0.083% IN NEBU
2.5000 mg | INHALATION_SOLUTION | Freq: Once | RESPIRATORY_TRACT | Status: AC
  Administered 2018-05-29: 2.5 mg via RESPIRATORY_TRACT
  Filled 2018-05-29: qty 3

## 2018-05-29 MED ORDER — ALBUTEROL SULFATE HFA 108 (90 BASE) MCG/ACT IN AERS
2.0000 | INHALATION_SPRAY | Freq: Four times a day (QID) | RESPIRATORY_TRACT | Status: DC | PRN
  Administered 2018-05-29: 2 via RESPIRATORY_TRACT
  Filled 2018-05-29: qty 6.7

## 2018-05-29 MED ORDER — ALBUTEROL SULFATE (2.5 MG/3ML) 0.083% IN NEBU: 3.0000 mL | INHALATION_SOLUTION | Freq: Four times a day (QID) | RESPIRATORY_TRACT | Status: DC | PRN

## 2018-05-29 MED ORDER — OXYCODONE HCL 5 MG PO TABS: 5.0000 mg | ORAL_TABLET | Freq: Four times a day (QID) | ORAL | 0 refills | Status: DC | PRN

## 2018-05-29 NOTE — Addendum Note (Signed)
Addendum  created 05/29/18 6004 by Lollie Sails, CRNA   Charge Capture section accepted

## 2018-05-29 NOTE — Evaluation (Signed)
Occupational Therapy Evaluation Patient Details Name: Edward Frank MRN: [id number] DOB: 10/11/1945 Today's Date: 05/29/2018    History of Present Illness S/P reverse total shoulder arthroplasty, left   Clinical Impression   OT educated pt in ADL activity as well as exercises as indicated by MD.  ( lap slides and pendulums) Pt did have some wheezing with OT after ADL activity.  RN notified and RT called.      Follow Up Recommendations  Follow surgeon's recommendation for DC plan and follow-up therapies    Equipment Recommendations  None recommended by OT       Precautions / Restrictions Precautions Precaution Comments: Ok to instruct Pendulums and lap slides as exercises.  Ok to use for ADL activity within ER 20, ABD 40, FF 60 Required Braces or Orthoses: Sling Restrictions Weight Bearing Restrictions: No      Mobility Bed Mobility Overal bed mobility: Needs Assistance Bed Mobility: Supine to Sit;Sit to Supine     Supine to sit: Min assist Sit to supine: Min assist      Transfers Overall transfer level: Needs assistance Equipment used: 1 person hand held assist;Straight cane Transfers: Sit to/from [facility] Sit to Stand: Min assist Stand pivot transfers: Min assist       General transfer comment: VC for safety and technique        ADL either performed or assessed with clinical judgement         Vision Patient Visual Report: No change from baseline              Pertinent Vitals/Pain Pain Assessment: 0-10 Pain Score: 3  Pain Location: legs Pain Descriptors / Indicators: Sore Pain Intervention(s): Limited activity within patient's tolerance;Monitored during session     Hand Dominance     Extremity/Trunk Assessment Upper Extremity Assessment Upper Extremity Assessment: LUE deficits/detail LUE Deficits / Details: pt in sling and nerve block active.             Communication Communication Communication: No difficulties   Cognition Arousal/Alertness: Awake/alert Behavior During Therapy: WFL for tasks assessed/performed Overall Cognitive Status: Within Functional Limits for tasks assessed                                        Exercises  Lap slides, gentle pendulum, and elbow/wrist and hand ROM    Shoulder Instructions Shoulder Instructions Donning/doffing shirt without moving shoulder: Minimal assistance;Caregiver independent with task Method for sponge bathing under operated UE: Minimal assistance;Caregiver independent with task Donning/doffing sling/immobilizer: Minimal assistance;Caregiver independent with task Correct positioning of sling/immobilizer: Minimal assistance;Caregiver independent with task Pendulum exercises (written home exercise program): Minimal assistance;Caregiver independent with task ROM for elbow, wrist and digits of operated UE: Minimal assistance;Caregiver independent with task Sling wearing schedule (on at all times/off for ADL's): Minimal assistance;Caregiver independent with task Proper positioning of operated UE when showering: Minimal assistance;Caregiver independent with task Dressing change: Minimal assistance;Caregiver independent with task Positioning of UE while sleeping: Minimal assistance;Caregiver independent with task    Home Living Family/patient expects to be discharged to:: Private residence Living Arrangements: Spouse/significant other Available Help at Discharge: Family Type of Home: House Home Access: Stairs to enter     Home Layout: One level     Bathroom Shower/Tub: Hospital doctor Toilet: Handicapped height     Home Equipment: [place] - single point;Toilet riser;Shower seat  Prior Functioning/Environment Level of Independence: Independent                          OT Goals(Current goals can be found in the care plan section) Acute Rehab OT Goals Patient Stated Goal: home today OT Goal Formulation:  With patient  OT Frequency:                AM-PAC OT "6 Clicks" Daily Activity     Outcome Measure Help from another person eating meals?: A Little Help from another person taking care of personal grooming?: A Little Help from another person toileting, which includes using toliet, bedpan, or urinal?: A Little Help from another person bathing (including washing, rinsing, drying)?: A Little Help from another person to put on and taking off regular upper body clothing?: A Little Help from another person to put on and taking off regular lower body clothing?: A Little 6 Click Score: 18   End of Session Nurse Communication: Mobility status;Other (comment)(wheezing with walking)  Activity Tolerance: Patient tolerated treatment well Patient left: in chair;with call bell/phone within reach  OT Visit Diagnosis: Muscle weakness (generalized) (M62.81)                Time: 5993-5701 OT Time Calculation (min): 25 min Charges:  OT General Charges $OT Visit: 1 Visit OT Evaluation $OT Eval Low Complexity: 1 Low OT Treatments $Self Care/Home Management : 8-22 mins  Kari Baars, OT Acute Rehabilitation Services [phone] Office- [phone]     Shirley Bolle, Edwena Felty D 05/29/2018, 12:45 PM

## 2018-05-29 NOTE — Discharge Summary (Signed)
PATIENT ID:      Edward Frank  MRN:     [id number] DOB/AGE:    75/01/1946 / 73 y.o.     DISCHARGE SUMMARY  ADMISSION DATE:    05/28/2018 DISCHARGE DATE:    ADMISSION DIAGNOSIS: left shoulder osteoarthritis, rotator cuff dysfunction Past Medical History:  Diagnosis Date  . Arthritis   . Bradycardia   . Fatty liver   . Fracture, tibia, with fibula teenage yrs.    no surgery, wore cast  . GERD (gastroesophageal reflux disease)   . Hyperlipidemia   . Hypertension   . 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 OSA (obstructive sleep apnea) 01/22/2011  . PONV (postoperative nausea and vomiting)   . 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 Right bundle branch block   . Syncope     DISCHARGE DIAGNOSIS:   Active Problems:   S/P reverse total shoulder arthroplasty, left   PROCEDURE: Procedure(s): REVERSE SHOULDER ARTHROPLASTY on 05/28/2018  CONSULTS:    HISTORY:  See H&P in chart.  HOSPITAL COURSE:  Edward Frank is a 73 y.o. admitted on 05/28/2018 with a diagnosis of left shoulder osteoarthritis, rotator cuff dysfunction.  They were brought to the operating room on 05/28/2018 and underwent Procedure(s): [place].    They were given perioperative antibiotics:  Anti-infectives (From admission, onward)   Start     Dose/Rate Route Frequency Ordered Stop   05/28/18 0615  ceFAZolin (ANCEF) IVPB 2g/100 mL premix     2 g 200 mL/hr over 30 Minutes Intravenous On call to O.R. 05/28/18 0600 05/28/18 0820    .  Patient underwent the above named procedure and tolerated it well. The following day they were hemodynamically stable and pain was controlled on oral analgesics. They were neurovascularly intact to the operative extremity. OT was ordered and worked with patient per protocol. They were medically and orthopaedically stable for discharge on .    DIAGNOSTIC STUDIES:  RECENT RADIOGRAPHIC STUDIES :  No results  found.  RECENT VITAL SIGNS:   Patient Vitals for the past 24 hrs:  BP Temp Temp src Pulse Resp SpO2  05/29/18 0158 92/68 - - 72 16 -  05/29/18 0147 (!) 84/59 97.6 F (36.4 C) Oral 73 16 97 %  05/28/18 2148 99/64 98.1 F (36.7 C) Oral 75 17 96 %  05/28/18 1828 91/63 (!) 97.5 F (36.4 C) Oral 83 16 96 %  05/28/18 1744 - - - - - 95 %  05/28/18 1513 103/70 98.1 F (36.7 C) Oral 92 18 96 %  05/28/18 1412 (!) 89/59 - - 94 - -  05/28/18 1349 (!) 88/62 - - - - -  05/28/18 1341 (!) 86/63 97.6 F (36.4 C) - 93 16 92 %  05/28/18 1235 112/80 97.8 F (36.6 C) Oral 87 18 95 %  05/28/18 1119 116/76 98 F (36.7 C) Oral 84 18 92 %  05/28/18 1100 - 98 F (36.7 C) - 82 19 96 %  05/28/18 1045 116/85 - - 79 17 96 %  05/28/18 1030 121/78 - - 80 20 98 %  05/28/18 1015 130/90 - - 81 18 97 %  05/28/18 1000 130/67 - - 85 (!) 27 97 %  05/28/18 0945 120/67 - - 86 15 97 %  05/28/18 0938 109/65 98.4 F (36.9 C) - 83 14 99 %  .  RECENT EKG RESULTS:    Orders placed or performed in visit on  11/03/17  . EKG 12-Lead    DISCHARGE INSTRUCTIONS:    DISCHARGE MEDICATIONS:   Allergies as of 05/29/2018      Reactions   Gabapentin Swelling   Peripheral edema in the legs, feet, and arms Peripheral edema in the legs, feet, and arms Peripheral edema in the legs, feet, and arms   Riluzole Other (See Comments)   Peripheral edema in the legs, feet, arms Other reaction(s): Other (See Comments) Peripheral edema in the legs, feet, arms      Medication List    TAKE these medications   aspirin EC 81 MG tablet Take 81 mg by mouth daily.   chlorthalidone 50 MG tablet Commonly known as:  HYGROTON TAKE 1 TABLET BY MOUTH ONCE DAILY   CoQ10 400 MG Caps Take 400 mg by mouth daily.   CRANBERRY PO Take 1 capsule by mouth 2 (two) times daily.   FISH OIL TRIPLE STRENGTH 1400 MG Caps Take 2,800 mg by mouth 2 (two) times daily.   hydrocortisone 2.5 % rectal cream Commonly known as:  ANUSOL-HC Place 1  application rectally 2 (two) times daily.   indomethacin 25 MG capsule Commonly known as:  INDOCIN Take 25 mg by mouth daily.   losartan 50 MG tablet Commonly known as:  COZAAR Take 1 tablet (50 mg total) by mouth daily.   methocarbamol 750 MG tablet Commonly known as:  ROBAXIN Take 750 mg by mouth 2 (two) times daily.   multivitamin with minerals Tabs tablet Take 1 tablet by mouth daily.   omeprazole 20 MG capsule Commonly known as:  PRILOSEC Take 40 mg by mouth daily.   OVER THE COUNTER MEDICATION Apply 1 application topically as needed (dry skin/brusing). dermon for dry skin/brusing   OVER THE COUNTER MEDICATION Take 1 tablet by mouth daily. Calcium citrate+D3 500-800 mg   oxyCODONE 5 MG immediate release tablet Commonly known as:  ROXICODONE Take 1 tablet (5 mg total) by mouth every 6 (six) hours as needed for severe pain.   pravastatin 20 MG tablet Commonly known as:  PRAVACHOL TAKE 1 TABLET BY MOUTH AT BEDTIME What changed:  when to take this   Resveratrol 100 MG Caps Take 300 mg by mouth daily.   tiZANidine 4 MG tablet Commonly known as:  ZANAFLEX Take 4 mg by mouth at bedtime as needed for muscle spasms.   traMADol 50 MG tablet Commonly known as:  ULTRAM Take 50 mg by mouth 2 (two) times daily.   Vitamin B-12 2500 MCG Subl Place 2,500 mcg under the tongue daily.       FOLLOW UP VISIT:   Follow-up Information    Justice Britain, MD.   Specialty:  Orthopedic Surgery Why:  call to be seen in 10-14 days Contact information: [address] [place] [address] [phone]           DISCHARGE XT:GGYI   DISCHARGE CONDITION:  Thereasa Parkin Kirtan Sada for Dr. Justice Britain 05/29/2018, 8:26 AM

## 2018-06-08 DIAGNOSIS — Z96612 Presence of left artificial shoulder joint: Secondary | ICD-10-CM | POA: Diagnosis not present

## 2018-06-08 DIAGNOSIS — Z471 Aftercare following joint replacement surgery: Secondary | ICD-10-CM | POA: Diagnosis not present

## 2018-06-15 ENCOUNTER — Ambulatory Visit: Payer: Medicare Other | Attending: Orthopedic Surgery | Admitting: Physical Therapy

## 2018-06-15 ENCOUNTER — Other Ambulatory Visit: Payer: Self-pay

## 2018-06-15 ENCOUNTER — Encounter: Payer: Self-pay | Admitting: Physical Therapy

## 2018-06-15 DIAGNOSIS — M25512 Pain in left shoulder: Secondary | ICD-10-CM | POA: Diagnosis not present

## 2018-06-15 DIAGNOSIS — M25612 Stiffness of left shoulder, not elsewhere classified: Secondary | ICD-10-CM | POA: Diagnosis not present

## 2018-06-15 DIAGNOSIS — M6281 Muscle weakness (generalized): Secondary | ICD-10-CM

## 2018-06-15 NOTE — Therapy (Addendum)
[facility] [place], [place], [address] Phone: [phone]   Fax:  [phone]  Physical Therapy Evaluation  Patient Details  Name: Edward Frank MRN: [id number] Date of Birth: 1945-12-13 Referring Provider (PT): Jenetta Loges, PA-C/Kevin Supple MD   Encounter Date: 06/15/2018  PT End of Session - 06/15/18 1947    Visit Number  1    Number of Visits  24    Date for PT Re-Evaluation  08/17/18    Authorization Type  Progress note every 10th visit; KX modifier at 15th visit    PT Start Time  1348    PT Stop Time  1430    PT Time Calculation (min)  42 min    Activity Tolerance  Patient tolerated treatment well    Behavior During Therapy  [facility] for tasks assessed/performed       Past Medical History:  Diagnosis Date  . Arthritis   . Bradycardia   . Fatty liver   . Fracture, tibia, with fibula teenage yrs.    no surgery, wore cast  . GERD (gastroesophageal reflux disease)   . Hyperlipidemia   . Hypertension   . 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 OSA (obstructive sleep apnea) 01/22/2011  . PONV (postoperative nausea and vomiting)   . 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 Right bundle branch block   . Syncope     Past Surgical History:  Procedure Laterality Date  . CHOLECYSTECTOMY N/A 06/13/2016   Procedure: LAPAROSCOPIC CHOLECYSTECTOMY;  Surgeon: Arta Bruce Kinsinger, MD;  Location: [facility];  Service: General;  Laterality: N/A;  . EYE SURGERY     Cataracts bil  . FOOT SURGERY  11-2008   hammer toe and bunion  . HERNIA REPAIR  12-2001   inguinal hernia bilateral  . REVERSE SHOULDER ARTHROPLASTY Left 05/28/2018   Procedure: REVERSE SHOULDER ARTHROPLASTY;  Surgeon: Justice Britain, MD;  Location: [facility];  Service: Orthopedics;  Laterality: Left;  162min  . SHOULDER ARTHROSCOPY WITH ROTATOR CUFF REPAIR AND SUBACROMIAL DECOMPRESSION Right 06/18/2012   Procedure:  RIGHT SHOULDER ARTHROSCOPY WITH SUBACROMIAL DECOMPRESSION AND DISTAL CLAVICLE RESECTION AND ROTATOR CUFF REPAIR;  Surgeon: Marin Shutter, MD;  Location: [facility];  Service: Orthopedics;  Laterality: Right;  . SHOULDER SURGERY  1977   Luxating     There were no vitals filed for this visit.   Subjective Assessment - 06/15/18 1937    Subjective  Patient arrives to physical therapy with his wife with reports of left shoulder pain  and difficulty with performing ADLs due to a left reverse totals shoulder replacement on 05/28/2018. Patient requires assistance with ADLs, dressing, as well as cutting food while eating from his wife. Patient reports performing HEP "sometimes" but has been using his left arm "to prevent stiffness". Patient reports pain at worst is 4/10 and pain at best is 1/10. Patient reports he has been getting out of the sling due to ulnar nerve irritation. Patient's goals are to decrease pain, improve movement, improve strength for functional activities.    Patient is accompained by:  Family member   Wife   Pertinent History  ALS, HTN, abdominal aorta aneurysm, left reverse total shoulder replacement 05/28/2018    Limitations  Lifting;House hold activities    Patient Stated Goals  decrease pain, improve function.    Currently in Pain?  Yes    Pain Score  2     Pain Location  Shoulder    Pain Orientation  Left    Pain Descriptors / Indicators  Aching    Pain Type  Surgical pain    Pain Onset  1 to 4 weeks ago    Pain Frequency  Intermittent    Aggravating Factors   increased movement    Pain Relieving Factors  rest    Effect of Pain on Daily Activities  needs assistance with ADLs         [facility] PT Assessment - 06/15/18 0001      Assessment   Medical Diagnosis  left reverse total shoulder    Referring Provider (PT)  Jenetta Loges, PA-C/Kevin Supple MD    Onset Date/Surgical Date  05/28/18    Hand Dominance  Right    Next MD Visit  07/06/2018    Prior Therapy  no       Precautions   Precautions  Shoulder    Type of Shoulder Precautions  follow Reverse TSA protocol    Required Braces or Orthoses  Sling      Restrictions   Weight Bearing Restrictions  Yes    Other Position/Activity Restrictions  Can attempt early WB at 6 weeks post op per MD for walker dependence      Balance Screen   Has the patient fallen in the past 6 months  Yes    How many times?  multiple due to ALS    Has the patient had a decrease in activity level because of a fear of falling?   Yes    Is the patient reluctant to leave their home because of a fear of falling?   No      Home Environment   Living Environment  Private residence    Living Arrangements  Spouse/significant other      Prior Function   Level of Independence  Needs assistance with ADLs;Needs assistance with homemaking      Observation/Other Assessments   Observations  Sling donned but noted with AROM      ROM / Strength   AROM / PROM / Strength  PROM      PROM   PROM Assessment Site  Shoulder    Right/Left Shoulder  Left    Left Shoulder Flexion  105 Degrees    Left Shoulder ABduction  70 Degrees    Left Shoulder External Rotation  40 Degrees      Palpation   Palpation comment  minimal tenderness to palpation to incision, increased tone in left biceps      Transfers   Comments  min assist to transition from sit to stand      Ambulation/Gait   Assistive device  Straight cane    Gait Pattern  Ataxic                Objective measurements completed on examination: See above findings.      [facility] Adult PT Treatment/Exercise - 06/15/18 0001      Modalities   Modalities  Vasopneumatic      Vasopneumatic   Number Minutes Vasopneumatic   15 minutes    Vasopnuematic Location   Shoulder    Vasopneumatic Pressure  Low    Vasopneumatic Temperature   46             PT Education - 06/15/18 1946    Education Details  Continue HEP provided by surgeon (pendulums, AROM bicep curls)     Person(s) Educated  Patient;Spouse  PT Long Term Goals - 06/15/18 1948      PT LONG TERM GOAL #1   Title  Patient will be independent with advanced HEP    Time  8    Period  Weeks    Status  New      PT LONG TERM GOAL #2   Title  Patient will demonstrate 130+ degrees of left shoulder flexion AROM to improve ability to perform functional tasks.    Time  8    Period  Weeks    Status  New      PT LONG TERM GOAL #3   Title  Patient will demonstrate 60+ degrees of left shoulder ER AROM to improve ability to don/doff apparel.    Time  8    Period  Weeks    Status  New      PT LONG TERM GOAL #4   Title  Patient will demonstrate ability functional IR to L4 or higher to improve ability to perform lower body dressing    Time  8    Period  Weeks    Status  New      PT LONG TERM GOAL #5   Title  Patient will demonstrate 4-/5 or greater of left shoulder MMT to improve stability during functional task.    Time  8    Period  Weeks    Status  New             Plan - 06/15/18 2010    Clinical Impression Statement  Patient is a 73 year old right handed male who presents to physical therapy with decreased left shoulder PROM due to a left reverse total shoulder replacement on 05/28/2018. Patient requires min A for transitions from supine to sit. Patient and PT discussed the purpose of the sling to prevent AROM as patient noted with active movement at the shoulder joint inside the sling. PT also discussed importance of following protocol to allow structures to heal. Due to patient's balance deficits due to ALS, PT emphasized importance of protecting the shoulder especially in the event of a fall. Patient and patient's wife reported understanding. Patient would  benefit from skilled physical therapy to address deficits and address patient's goals.     History and Personal Factors relevant to plan of care:  left reverse TSA 05/28/2018, ALS, HTN, AAA    Clinical Presentation  Stable     Clinical Decision Making  Moderate    Rehab Potential  Good    PT Frequency  3x / week    PT Duration  8 weeks    PT Treatment/Interventions  ADLs/Self Care Home Management;Electrical Stimulation;Moist Heat;Cryotherapy;Manual techniques;Passive range of motion;Scar mobilization;Therapeutic exercise;Patient/family education;Therapeutic activities    PT Next Visit Plan  PROM to left shoulder, modalities PRN for pain relief    PT Home Exercise Plan  continue pendulums and AROM elbow flexion provided by surgeon    Consulted and Agree with Plan of Care  Patient;Family member/caregiver       Patient will benefit from skilled therapeutic intervention in order to improve the following deficits and impairments:  Pain, Decreased strength, Decreased range of motion, Decreased activity tolerance, Impaired UE functional use, Postural dysfunction  Visit Diagnosis: Acute pain of left shoulder - Plan: PT plan of care cert/re-cert  Stiffness of left shoulder, not elsewhere classified - Plan: PT plan of care cert/re-cert  Muscle weakness (generalized) - Plan: PT plan of care cert/re-cert     Problem List Patient Active Problem  List   Diagnosis Date Noted  . S/P reverse total shoulder arthroplasty, left 05/28/2018  . Prediabetes 11/25/2017  . Pain of left shoulder joint on movement 09/11/2017  . Bilateral leg edema 06/03/2017  . Abdominal distension 06/03/2017  . Osteoarthritis of left glenohumeral joint 01/21/2017  . Grade I internal hemorrhoids 11/22/2016  . Left foot pain 11/22/2016  . LVH (left ventricular hypertrophy) 08/24/2015  . Counseling regarding end of life decision making 11/17/2014  . DDD (degenerative disc disease), lumbosacral 01/12/2014  . Arthropathy of lumbar facet joint 01/12/2014  . Upper motor neuron disease ([place]) 01/12/2014  . AAA (abdominal aortic aneurysm) without rupture ([place]) 09/22/2012  . Spinal stenosis, lumbar 09/20/2011  . Routine general medical examination at a  health care facility 09/02/2011  . OSA (obstructive sleep apnea) 01/22/2011  . RIGHT BUNDLE BRANCH BLOCK 03/26/2010  . BRADYCARDIA 02/13/2010  . HYPERCHOLESTEROLEMIA 08/02/2009  . ALS 08/02/2009  . HYPERTENSION, MILD 08/02/2009  . GERD 08/02/2009   Gabriela Eves, PT, DPT 06/15/2018, 8:35 PM  [facility] [address] [place], [place], [address] Phone: [phone]   Fax:  [phone]  Name: MYRTLE BARNHARD MRN: [id number] Date of Birth: [birth year]

## 2018-06-17 ENCOUNTER — Ambulatory Visit: Payer: Medicare Other | Admitting: Physical Therapy

## 2018-06-17 ENCOUNTER — Encounter: Payer: Self-pay | Admitting: Physical Therapy

## 2018-06-17 DIAGNOSIS — M25512 Pain in left shoulder: Secondary | ICD-10-CM

## 2018-06-17 DIAGNOSIS — M6281 Muscle weakness (generalized): Secondary | ICD-10-CM | POA: Diagnosis not present

## 2018-06-17 DIAGNOSIS — M25612 Stiffness of left shoulder, not elsewhere classified: Secondary | ICD-10-CM | POA: Diagnosis not present

## 2018-06-17 NOTE — Therapy (Signed)
[facility] [place], [place], [address] Phone: [phone]   Fax:  [phone]  Physical Therapy Treatment  Patient Details  Name: Edward Frank MRN: [id number] Date of Birth: Jun 04, 1945 Referring Provider (PT): Jenetta Loges, PA-C/Kevin Supple MD   Encounter Date: 06/17/2018  PT End of Session - 06/17/18 1343    Visit Number  2    Number of Visits  24    Date for PT Re-Evaluation  08/17/18    Authorization Type  Progress note every 10th visit; KX modifier at 15th visit    PT Start Time  1348    PT Stop Time  1434    PT Time Calculation (min)  46 min    Activity Tolerance  Patient tolerated treatment well    Behavior During Therapy  [facility] for tasks assessed/performed       Past Medical History:  Diagnosis Date  . Arthritis   . Bradycardia   . Fatty liver   . Fracture, tibia, with fibula teenage yrs.    no surgery, wore cast  . GERD (gastroesophageal reflux disease)   . Hyperlipidemia   . Hypertension   . 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 OSA (obstructive sleep apnea) 01/22/2011  . PONV (postoperative nausea and vomiting)   . 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 Right bundle branch block   . Syncope     Past Surgical History:  Procedure Laterality Date  . CHOLECYSTECTOMY N/A 06/13/2016   Procedure: LAPAROSCOPIC CHOLECYSTECTOMY;  Surgeon: Arta Bruce Kinsinger, MD;  Location: [facility];  Service: General;  Laterality: N/A;  . EYE SURGERY     Cataracts bil  . FOOT SURGERY  11-2008   hammer toe and bunion  . HERNIA REPAIR  12-2001   inguinal hernia bilateral  . REVERSE SHOULDER ARTHROPLASTY Left 05/28/2018   Procedure: REVERSE SHOULDER ARTHROPLASTY;  Surgeon: Justice Britain, MD;  Location: [facility];  Service: Orthopedics;  Laterality: Left;  171min  . SHOULDER ARTHROSCOPY WITH ROTATOR CUFF REPAIR AND SUBACROMIAL DECOMPRESSION Right 06/18/2012   Procedure: RIGHT  SHOULDER ARTHROSCOPY WITH SUBACROMIAL DECOMPRESSION AND DISTAL CLAVICLE RESECTION AND ROTATOR CUFF REPAIR;  Surgeon: Marin Shutter, MD;  Location: [facility];  Service: Orthopedics;  Laterality: Right;  . SHOULDER SURGERY  1977   Luxating     There were no vitals filed for this visit.  Subjective Assessment - 06/17/18 1343    Subjective  No complaints.  (Pended)     Patient is accompained by:  Family member  (Pended)    Wife   Pertinent History  ALS, HTN, abdominal aorta aneurysm, left reverse total shoulder replacement 05/28/2018    Limitations  Lifting;House hold activities    Patient Stated Goals  decrease pain, improve function.    Currently in Pain?  Yes  (Pended)     Pain Score  2   (Pended)     Pain Location  Shoulder  (Pended)     Pain Orientation  Left  (Pended)     Pain Descriptors / Indicators  Discomfort  (Pended)     Pain Type  Surgical pain  (Pended)     Pain Onset  1 to 4 weeks ago  (Pended)          [facility] PT Assessment - 06/17/18 0001      Assessment   Medical Diagnosis  left reverse total shoulder    Referring Provider (PT)  Jenetta Loges,  PA-C/Kevin Supple MD    Onset Date/Surgical Date  05/28/18    Hand Dominance  Right    Next MD Visit  07/06/2018    Prior Therapy  no      Precautions   Precautions  Shoulder    Type of Shoulder Precautions  follow Reverse TSA protocol    Required Braces or Orthoses  Sling      Restrictions   Weight Bearing Restrictions  Yes    Other Position/Activity Restrictions  Can attempt early WB at 6 weeks post op per MD for walker dependence                   [facility] Adult PT Treatment/Exercise - 06/17/18 0001      Modalities   Modalities  Vasopneumatic      Vasopneumatic   Number Minutes Vasopneumatic   15 minutes    Vasopnuematic Location   Shoulder    Vasopneumatic Pressure  Low    Vasopneumatic Temperature   36      Manual Therapy   Manual Therapy  Passive ROM    Passive ROM  PROM of L shoulder into flexion,  ER, IR with gentle holds at end range                  PT Long Term Goals - 06/15/18 1948      PT LONG TERM GOAL #1   Title  Patient will be independent with advanced HEP    Time  8    Period  Weeks    Status  New      PT LONG TERM GOAL #2   Title  Patient will demonstrate 130+ degrees of left shoulder flexion AROM to improve ability to perform functional tasks.    Time  8    Period  Weeks    Status  New      PT LONG TERM GOAL #3   Title  Patient will demonstrate 60+ degrees of left shoulder ER AROM to improve ability to don/doff apparel.    Time  8    Period  Weeks    Status  New      PT LONG TERM GOAL #4   Title  Patient will demonstrate ability functional IR to L4 or higher to improve ability to perform lower body dressing    Time  8    Period  Weeks    Status  New      PT LONG TERM GOAL #5   Title  Patient will demonstrate 4-/5 or greater of left shoulder MMT to improve stability during functional task.    Time  8    Period  Weeks    Status  New            Plan - 06/17/18 1438    Clinical Impression Statement  Patient presented in clinic with reports of minimal L shoulder discomfort. Sling donned upon arrival. Firm end feels and smooth arc of motion noted during PROM of L shoulder in all directions. Intermittant facial grimacing noted during PROM of L shoulder ER at end range. Normal modality response noted following removal of the modality.    Rehab Potential  Good    PT Frequency  3x / week    PT Duration  8 weeks    PT Treatment/Interventions  ADLs/Self Care Home Management;Electrical Stimulation;Moist Heat;Cryotherapy;Manual techniques;Passive range of motion;Scar mobilization;Therapeutic exercise;Patient/family education;Therapeutic activities    PT Next Visit Plan  PROM to left shoulder, modalities PRN  for pain relief    PT Home Exercise Plan  continue pendulums and AROM elbow flexion provided by surgeon    Consulted and Agree with Plan of Care   Patient;Family member/caregiver    Family Member Consulted  Wife       Patient will benefit from skilled therapeutic intervention in order to improve the following deficits and impairments:  Pain, Decreased strength, Decreased range of motion, Decreased activity tolerance, Impaired UE functional use, Postural dysfunction  Visit Diagnosis: Acute pain of left shoulder  Stiffness of left shoulder, not elsewhere classified  Muscle weakness (generalized)     Problem List Patient Active Problem List   Diagnosis Date Noted  . S/P reverse total shoulder arthroplasty, left 05/28/2018  . Prediabetes 11/25/2017  . Pain of left shoulder joint on movement 09/11/2017  . Bilateral leg edema 06/03/2017  . Abdominal distension 06/03/2017  . Osteoarthritis of left glenohumeral joint 01/21/2017  . Grade I internal hemorrhoids 11/22/2016  . Left foot pain 11/22/2016  . LVH (left ventricular hypertrophy) 08/24/2015  . Counseling regarding end of life decision making 11/17/2014  . DDD (degenerative disc disease), lumbosacral 01/12/2014  . Arthropathy of lumbar facet joint 01/12/2014  . Upper motor neuron disease ([place]) 01/12/2014  . AAA (abdominal aortic aneurysm) without rupture ([place]) 09/22/2012  . Spinal stenosis, lumbar 09/20/2011  . Routine general medical examination at a health care facility 09/02/2011  . OSA (obstructive sleep apnea) 01/22/2011  . RIGHT BUNDLE BRANCH BLOCK 03/26/2010  . BRADYCARDIA 02/13/2010  . HYPERCHOLESTEROLEMIA 08/02/2009  . ALS 08/02/2009  . HYPERTENSION, MILD 08/02/2009  . GERD 08/02/2009    Standley Brooking, PTA 06/17/2018, 2:43 PM  [facility] [address] [place], [place], [address] Phone: [phone]   Fax:  [phone]  Name: Edward Frank MRN: [id number] Date of Birth: 10-01-1945

## 2018-06-19 ENCOUNTER — Encounter: Payer: Self-pay | Admitting: Physical Therapy

## 2018-06-19 ENCOUNTER — Ambulatory Visit: Payer: Medicare Other | Admitting: Physical Therapy

## 2018-06-19 DIAGNOSIS — M25612 Stiffness of left shoulder, not elsewhere classified: Secondary | ICD-10-CM

## 2018-06-19 DIAGNOSIS — M25512 Pain in left shoulder: Secondary | ICD-10-CM

## 2018-06-19 DIAGNOSIS — M6281 Muscle weakness (generalized): Secondary | ICD-10-CM | POA: Diagnosis not present

## 2018-06-19 NOTE — Therapy (Signed)
[facility] [place], [place], [address] Phone: [phone]   Fax:  [phone]  Physical Therapy Treatment  Patient Details  Name: Edward Frank MRN: [id number] Date of Birth: Jun 12, 1945 Referring Provider (PT): Jenetta Loges, PA-C/Kevin Supple MD   Encounter Date: 06/19/2018  PT End of Session - 06/19/18 0903    Visit Number  3    Number of Visits  24    Date for PT Re-Evaluation  08/17/18    Authorization Type  Progress note every 10th visit; KX modifier at 15th visit    PT Start Time  0903    PT Stop Time  0945    PT Time Calculation (min)  42 min    Activity Tolerance  Patient tolerated treatment well    Behavior During Therapy  [facility] for tasks assessed/performed       Past Medical History:  Diagnosis Date  . Arthritis   . Bradycardia   . Fatty liver   . Fracture, tibia, with fibula teenage yrs.    no surgery, wore cast  . GERD (gastroesophageal reflux disease)   . Hyperlipidemia   . Hypertension   . 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 OSA (obstructive sleep apnea) 01/22/2011  . PONV (postoperative nausea and vomiting)   . 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 Right bundle branch block   . Syncope     Past Surgical History:  Procedure Laterality Date  . CHOLECYSTECTOMY N/A 06/13/2016   Procedure: LAPAROSCOPIC CHOLECYSTECTOMY;  Surgeon: Arta Bruce Kinsinger, MD;  Location: [facility];  Service: General;  Laterality: N/A;  . EYE SURGERY     Cataracts bil  . FOOT SURGERY  11-2008   hammer toe and bunion  . HERNIA REPAIR  12-2001   inguinal hernia bilateral  . REVERSE SHOULDER ARTHROPLASTY Left 05/28/2018   Procedure: REVERSE SHOULDER ARTHROPLASTY;  Surgeon: Justice Britain, MD;  Location: [facility];  Service: Orthopedics;  Laterality: Left;  1108min  . SHOULDER ARTHROSCOPY WITH ROTATOR CUFF REPAIR AND SUBACROMIAL DECOMPRESSION Right 06/18/2012   Procedure: RIGHT  SHOULDER ARTHROSCOPY WITH SUBACROMIAL DECOMPRESSION AND DISTAL CLAVICLE RESECTION AND ROTATOR CUFF REPAIR;  Surgeon: Marin Shutter, MD;  Location: [place];  Service: Orthopedics;  Laterality: Right;  . SHOULDER SURGERY  1977   Luxating     There were no vitals filed for this visit.  Subjective Assessment - 06/19/18 0901    Subjective  Reports soreness upon arrival.    Patient is accompained by:  Family member   Wife   Pertinent History  ALS, HTN, abdominal aorta aneurysm, left reverse total shoulder replacement 05/28/2018    Limitations  Lifting;House hold activities    Patient Stated Goals  decrease pain, improve function.    Currently in Pain?  Yes    Pain Score  3     Pain Location  Shoulder    Pain Orientation  Left    Pain Descriptors / Indicators  Sore    Pain Type  Surgical pain    Pain Onset  1 to 4 weeks ago    Pain Frequency  Intermittent         [facility] PT Assessment - 06/19/18 0001      Assessment   Medical Diagnosis  left reverse total shoulder    Referring Provider (PT)  Jenetta Loges, PA-C/Kevin Supple MD    Onset Date/Surgical Date  05/28/18    Hand Dominance  Right  Next MD Visit  07/06/2018    Prior Therapy  no      Precautions   Precautions  Shoulder    Type of Shoulder Precautions  follow Reverse TSA protocol    Required Braces or Orthoses  Sling      Restrictions   Weight Bearing Restrictions  Yes    Other Position/Activity Restrictions  Can attempt early WB at 6 weeks post op per MD for walker dependence                   [facility] Adult PT Treatment/Exercise - 06/19/18 0001      Modalities   Modalities  Vasopneumatic      Vasopneumatic   Number Minutes Vasopneumatic   15 minutes    Vasopnuematic Location   Shoulder    Vasopneumatic Pressure  Low    Vasopneumatic Temperature   51      Manual Therapy   Manual Therapy  Passive ROM    Passive ROM  PROM of L shoulder into flexion, ER, IR with gentle holds at end range                   PT Long Term Goals - 06/15/18 1948      PT LONG TERM GOAL #1   Title  Patient will be independent with advanced HEP    Time  8    Period  Weeks    Status  New      PT LONG TERM GOAL #2   Title  Patient will demonstrate 130+ degrees of left shoulder flexion AROM to improve ability to perform functional tasks.    Time  8    Period  Weeks    Status  New      PT LONG TERM GOAL #3   Title  Patient will demonstrate 60+ degrees of left shoulder ER AROM to improve ability to don/doff apparel.    Time  8    Period  Weeks    Status  New      PT LONG TERM GOAL #4   Title  Patient will demonstrate ability functional IR to L4 or higher to improve ability to perform lower body dressing    Time  8    Period  Weeks    Status  New      PT LONG TERM GOAL #5   Title  Patient will demonstrate 4-/5 or greater of left shoulder MMT to improve stability during functional task.    Time  8    Period  Weeks    Status  New            Plan - 06/19/18 0941    Clinical Impression Statement  Patient presented in clinic with only complaints of L shoulder soreness. Firm end feels and smooth arc of motion noted following removal of the modalities. Improvements noted of L shoulder PROM in all directions especially ER. Normal modalities response noted following removal of the modalities. Edema noted in L forearm and hand today.    Rehab Potential  Good    PT Frequency  3x / week    PT Duration  8 weeks    PT Treatment/Interventions  ADLs/Self Care Home Management;Electrical Stimulation;Moist Heat;Cryotherapy;Manual techniques;Passive range of motion;Scar mobilization;Therapeutic exercise;Patient/family education;Therapeutic activities    PT Next Visit Plan  PROM to left shoulder, modalities PRN for pain relief    PT Home Exercise Plan  continue pendulums and AROM elbow flexion provided by surgeon  Consulted and Agree with Plan of Care  Patient;Family member/caregiver     Family Member Consulted  Wife       Patient will benefit from skilled therapeutic intervention in order to improve the following deficits and impairments:  Pain, Decreased strength, Decreased range of motion, Decreased activity tolerance, Impaired UE functional use, Postural dysfunction  Visit Diagnosis: Acute pain of left shoulder  Stiffness of left shoulder, not elsewhere classified  Muscle weakness (generalized)     Problem List Patient Active Problem List   Diagnosis Date Noted  . S/P reverse total shoulder arthroplasty, left 05/28/2018  . Prediabetes 11/25/2017  . Pain of left shoulder joint on movement 09/11/2017  . Bilateral leg edema 06/03/2017  . Abdominal distension 06/03/2017  . Osteoarthritis of left glenohumeral joint 01/21/2017  . Grade I internal hemorrhoids 11/22/2016  . Left foot pain 11/22/2016  . LVH (left ventricular hypertrophy) 08/24/2015  . Counseling regarding end of life decision making 11/17/2014  . DDD (degenerative disc disease), lumbosacral 01/12/2014  . Arthropathy of lumbar facet joint 01/12/2014  . Upper motor neuron disease ([place]) 01/12/2014  . AAA (abdominal aortic aneurysm) without rupture ([place]) 09/22/2012  . Spinal stenosis, lumbar 09/20/2011  . Routine general medical examination at a health care facility 09/02/2011  . OSA (obstructive sleep apnea) 01/22/2011  . RIGHT BUNDLE BRANCH BLOCK 03/26/2010  . BRADYCARDIA 02/13/2010  . HYPERCHOLESTEROLEMIA 08/02/2009  . ALS 08/02/2009  . HYPERTENSION, MILD 08/02/2009  . GERD 08/02/2009    Standley Brooking, PTA 06/19/2018, 9:52 AM  [facility] [address] [place], [place], [address] Phone: [phone]   Fax:  [phone]  Name: Edward Frank MRN: [id number] Date of Birth: July 01, 1945

## 2018-06-22 ENCOUNTER — Ambulatory Visit: Payer: Medicare Other | Attending: Orthopedic Surgery | Admitting: Physical Therapy

## 2018-06-22 DIAGNOSIS — M6281 Muscle weakness (generalized): Secondary | ICD-10-CM | POA: Diagnosis not present

## 2018-06-22 DIAGNOSIS — M25612 Stiffness of left shoulder, not elsewhere classified: Secondary | ICD-10-CM | POA: Diagnosis not present

## 2018-06-22 DIAGNOSIS — M25512 Pain in left shoulder: Secondary | ICD-10-CM | POA: Diagnosis not present

## 2018-06-22 NOTE — Therapy (Signed)
[facility] [place], [place], [address] Phone: [phone]   Fax:  [phone]  Physical Therapy Treatment  Patient Details  Name: Edward Frank MRN: [id number] Date of Birth: 1945-05-08 Referring Provider (PT): Jenetta Loges, PA-C/Kevin Supple MD   Encounter Date: 06/22/2018  PT End of Session - 06/22/18 1508    Visit Number  4    Number of Visits  24    Date for PT Re-Evaluation  08/17/18    Authorization Type  Progress note every 10th visit; KX modifier at 15th visit    PT Start Time  1431    PT Stop Time  1517    PT Time Calculation (min)  46 min    Activity Tolerance  Patient tolerated treatment well    Behavior During Therapy  [facility] for tasks assessed/performed       Past Medical History:  Diagnosis Date  . Arthritis   . Bradycardia   . Fatty liver   . Fracture, tibia, with fibula teenage yrs.    no surgery, wore cast  . GERD (gastroesophageal reflux disease)   . Hyperlipidemia   . Hypertension   . 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 OSA (obstructive sleep apnea) 01/22/2011  . PONV (postoperative nausea and vomiting)   . 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 Right bundle branch block   . Syncope     Past Surgical History:  Procedure Laterality Date  . CHOLECYSTECTOMY N/A 06/13/2016   Procedure: LAPAROSCOPIC CHOLECYSTECTOMY;  Surgeon: Arta Bruce Kinsinger, MD;  [facility];  Service: General;  Laterality: N/A;  . EYE SURGERY     Cataracts bil  . FOOT SURGERY  11-2008   hammer toe and bunion  . HERNIA REPAIR  12-2001   inguinal hernia bilateral  . REVERSE SHOULDER ARTHROPLASTY Left 05/28/2018   Procedure: REVERSE SHOULDER ARTHROPLASTY;  Surgeon: Justice Britain, MD;  [facility];  Service: Orthopedics;  Laterality: Left;  131min  . SHOULDER ARTHROSCOPY WITH ROTATOR CUFF REPAIR AND SUBACROMIAL DECOMPRESSION Right 06/18/2012   Procedure: RIGHT  SHOULDER ARTHROSCOPY WITH SUBACROMIAL DECOMPRESSION AND DISTAL CLAVICLE RESECTION AND ROTATOR CUFF REPAIR;  Surgeon: Marin Shutter, MD;  Location: [place];  Service: Orthopedics;  Laterality: Right;  . SHOULDER SURGERY  1977   Luxating     There were no vitals filed for this visit.  Subjective Assessment - 06/22/18 1433    Subjective  Patient arrived with some ongoing soreness    Patient is accompained by:  Family member    Pertinent History  ALS, HTN, abdominal aorta aneurysm, left reverse total shoulder replacement 05/28/2018    Limitations  Lifting;House hold activities    Patient Stated Goals  decrease pain, improve function.    Currently in Pain?  Yes    Pain Score  2     Pain Location  Shoulder    Pain Orientation  Left    Pain Descriptors / Indicators  Sore    Pain Type  Surgical pain    Pain Onset  1 to 4 weeks ago    Pain Frequency  Intermittent    Aggravating Factors   movement    Pain Relieving Factors  rest                       [facility] Adult PT Treatment/Exercise - 06/22/18 0001      Vasopneumatic   Number Minutes Vasopneumatic  15 minutes    Vasopnuematic Location   Shoulder    Vasopneumatic Pressure  Low      Manual Therapy   Manual Therapy  Passive ROM    Manual therapy comments  perform in hooklying    Passive ROM  PROM of L shoulder into flexion, ER, IR with gentle holds at end range                  PT Long Term Goals - 06/15/18 1948      PT LONG TERM GOAL #1   Title  Patient will be independent with advanced HEP    Time  8    Period  Weeks    Status  New      PT LONG TERM GOAL #2   Title  Patient will demonstrate 130+ degrees of left shoulder flexion AROM to improve ability to perform functional tasks.    Time  8    Period  Weeks    Status  New      PT LONG TERM GOAL #3   Title  Patient will demonstrate 60+ degrees of left shoulder ER AROM to improve ability to don/doff apparel.    Time  8    Period  Weeks    Status   New      PT LONG TERM GOAL #4   Title  Patient will demonstrate ability functional IR to L4 or higher to improve ability to perform lower body dressing    Time  8    Period  Weeks    Status  New      PT LONG TERM GOAL #5   Title  Patient will demonstrate 4-/5 or greater of left shoulder MMT to improve stability during functional task.    Time  8    Period  Weeks    Status  New            Plan - 06/22/18 1510    Clinical Impression Statement  Patient tolerated treatment well today. Patient progressing well with ROM yet increased guarding today. Patient requied ossilations to relax. Goals ongoing at this time.     Rehab Potential  Good    PT Frequency  3x / week    PT Duration  8 weeks    PT Treatment/Interventions  ADLs/Self Care Home Management;Electrical Stimulation;Moist Heat;Cryotherapy;Manual techniques;Passive range of motion;Scar mobilization;Therapeutic exercise;Patient/family education;Therapeutic activities    PT Next Visit Plan  PROM to left shoulder, modalities PRN for pain relief    Consulted and Agree with Plan of Care  Patient;Family member/caregiver       Patient will benefit from skilled therapeutic intervention in order to improve the following deficits and impairments:  Pain, Decreased strength, Decreased range of motion, Decreased activity tolerance, Impaired UE functional use, Postural dysfunction  Visit Diagnosis: Acute pain of left shoulder  Stiffness of left shoulder, not elsewhere classified  Muscle weakness (generalized)     Problem List Patient Active Problem List   Diagnosis Date Noted  . S/P reverse total shoulder arthroplasty, left 05/28/2018  . Prediabetes 11/25/2017  . Pain of left shoulder joint on movement 09/11/2017  . Bilateral leg edema 06/03/2017  . Abdominal distension 06/03/2017  . Osteoarthritis of left glenohumeral joint 01/21/2017  . Grade I internal hemorrhoids 11/22/2016  . Left foot pain 11/22/2016  . LVH (left  ventricular hypertrophy) 08/24/2015  . Counseling regarding end of life decision making 11/17/2014  . DDD (degenerative disc disease), lumbosacral 01/12/2014  . Arthropathy of  lumbar facet joint 01/12/2014  . Upper motor neuron disease ([place]) 01/12/2014  . AAA (abdominal aortic aneurysm) without rupture ([place]) 09/22/2012  . Spinal stenosis, lumbar 09/20/2011  . Routine general medical examination at a health care facility 09/02/2011  . OSA (obstructive sleep apnea) 01/22/2011  . RIGHT BUNDLE BRANCH BLOCK 03/26/2010  . BRADYCARDIA 02/13/2010  . HYPERCHOLESTEROLEMIA 08/02/2009  . ALS 08/02/2009  . HYPERTENSION, MILD 08/02/2009  . GERD 08/02/2009    Phillips Climes, PTA 06/22/2018, 3:26 PM  [facility] [place], [place], [address] Phone: [phone]   Fax:  [phone]  Name: DONELL TOMKINS MRN: [id number] Date of Birth: 22-Apr-1946

## 2018-06-24 ENCOUNTER — Ambulatory Visit: Payer: Medicare Other | Admitting: Physical Therapy

## 2018-06-24 DIAGNOSIS — M25512 Pain in left shoulder: Secondary | ICD-10-CM | POA: Diagnosis not present

## 2018-06-24 DIAGNOSIS — M6281 Muscle weakness (generalized): Secondary | ICD-10-CM | POA: Diagnosis not present

## 2018-06-24 DIAGNOSIS — M25612 Stiffness of left shoulder, not elsewhere classified: Secondary | ICD-10-CM | POA: Diagnosis not present

## 2018-06-24 NOTE — Therapy (Addendum)
[facility] [place], [place], [address] Phone: [phone]   Fax:  [phone]  Physical Therapy Treatment  Patient Details  Name: Edward Frank MRN: [id number] Date of Birth: 06-02-1945 Referring Provider (PT): Jenetta Loges, PA-C/Kevin Supple MD   Encounter Date: 06/24/2018  PT End of Session - 06/24/18 1039    Visit Number  5    Number of Visits  24    Date for PT Re-Evaluation  08/17/18    Authorization Type  Progress note every 10th visit; KX modifier at 15th visit    PT Start Time  0945    PT Stop Time  1039    PT Time Calculation (min)  54 min    Activity Tolerance  Patient tolerated treatment well    Behavior During Therapy  [facility] for tasks assessed/performed       Past Medical History:  Diagnosis Date  . Arthritis   . Bradycardia   . Fatty liver   . Fracture, tibia, with fibula teenage yrs.    no surgery, wore cast  . GERD (gastroesophageal reflux disease)   . Hyperlipidemia   . Hypertension   . 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 OSA (obstructive sleep apnea) 01/22/2011  . PONV (postoperative nausea and vomiting)   . 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 Right bundle branch block   . Syncope     Past Surgical History:  Procedure Laterality Date  . CHOLECYSTECTOMY N/A 06/13/2016   Procedure: LAPAROSCOPIC CHOLECYSTECTOMY;  Surgeon: Arta Bruce Kinsinger, MD;  [facility];  Service: General;  Laterality: N/A;  . EYE SURGERY     Cataracts bil  . FOOT SURGERY  11-2008   hammer toe and bunion  . HERNIA REPAIR  12-2001   inguinal hernia bilateral  . REVERSE SHOULDER ARTHROPLASTY Left 05/28/2018   Procedure: REVERSE SHOULDER ARTHROPLASTY;  Surgeon: Justice Britain, MD;  [facility];  Service: Orthopedics;  Laterality: Left;  183min  . SHOULDER ARTHROSCOPY WITH ROTATOR CUFF REPAIR AND SUBACROMIAL DECOMPRESSION Right 06/18/2012   Procedure: RIGHT  SHOULDER ARTHROSCOPY WITH SUBACROMIAL DECOMPRESSION AND DISTAL CLAVICLE RESECTION AND ROTATOR CUFF REPAIR;  Surgeon: Marin Shutter, MD;  Location: [place];  Service: Orthopedics;  Laterality: Right;  . SHOULDER SURGERY  1977   Luxating     There were no vitals filed for this visit.  Subjective Assessment - 06/24/18 0950    Subjective  Reports having pain last night 3-4/10 and was more aware of it; at the moment 2/10.    Patient is accompained by:  Family member    Pertinent History  ALS, HTN, abdominal aorta aneurysm, left reverse total shoulder replacement 05/28/2018    Limitations  Lifting;House hold activities    Patient Stated Goals  decrease pain, improve function.    Currently in Pain?  Yes    Pain Score  2          [facility] PT Assessment - 06/24/18 0001      Assessment   Medical Diagnosis  left reverse total shoulder    Referring Provider (PT)  Jenetta Loges, PA-C/Kevin Supple MD    Onset Date/Surgical Date  05/28/18    Hand Dominance  Right    Next MD Visit  07/06/2018    Prior Therapy  no      Precautions   Precautions  Shoulder    Type of Shoulder Precautions  follow Reverse TSA protocol  Required Braces or Orthoses  Sling      Restrictions   Weight Bearing Restrictions  Yes    Other Position/Activity Restrictions  Can attempt early WB at 6 weeks post op per MD for walker dependence         See flow sheets above for treatment.                       PT Long Term Goals - 06/15/18 1948      PT LONG TERM GOAL #1   Title  Patient will be independent with advanced HEP    Time  8    Period  Weeks    Status  New      PT LONG TERM GOAL #2   Title  Patient will demonstrate 130+ degrees of left shoulder flexion AROM to improve ability to perform functional tasks.    Time  8    Period  Weeks    Status  New      PT LONG TERM GOAL #3   Title  Patient will demonstrate 60+ degrees of left shoulder ER AROM to improve ability to don/doff apparel.     Time  8    Period  Weeks    Status  New      PT LONG TERM GOAL #4   Title  Patient will demonstrate ability functional IR to L4 or higher to improve ability to perform lower body dressing    Time  8    Period  Weeks    Status  New      PT LONG TERM GOAL #5   Title  Patient will demonstrate 4-/5 or greater of left shoulder MMT to improve stability during functional task.    Time  8    Period  Weeks    Status  New            Plan - 06/24/18 1041    Clinical Impression Statement  Patient was able to tolerate treatment fairly well but noted with ongoing guarding especially with ER. Patient attempted to help with shoulder flexion and required multiple verbal cues to allow arm to relax and allow PT to move arm for him. Normal response to modalities at end of session.     Stability/Clinical Decision Making  Stable/Uncomplicated    Clinical Decision Making  Moderate    Rehab Potential  Good    PT Frequency  3x / week    PT Duration  8 weeks    PT Treatment/Interventions  ADLs/Self Care Home Management;Electrical Stimulation;Moist Heat;Cryotherapy;Manual techniques;Passive range of motion;Scar mobilization;Therapeutic exercise;Patient/family education;Therapeutic activities    PT Next Visit Plan  PROM to left shoulder, modalities PRN for pain relief    Consulted and Agree with Plan of Care  Patient       Patient will benefit from skilled therapeutic intervention in order to improve the following deficits and impairments:  Pain, Decreased strength, Decreased range of motion, Decreased activity tolerance, Impaired UE functional use, Postural dysfunction  Visit Diagnosis: Acute pain of left shoulder  Stiffness of left shoulder, not elsewhere classified  Muscle weakness (generalized)     Problem List Patient Active Problem List   Diagnosis Date Noted  . S/P reverse total shoulder arthroplasty, left 05/28/2018  . Prediabetes 11/25/2017  . Pain of left shoulder joint on movement  09/11/2017  . Bilateral leg edema 06/03/2017  . Abdominal distension 06/03/2017  . Osteoarthritis of left glenohumeral joint 01/21/2017  . Grade I  internal hemorrhoids 11/22/2016  . Left foot pain 11/22/2016  . LVH (left ventricular hypertrophy) 08/24/2015  . Counseling regarding end of life decision making 11/17/2014  . DDD (degenerative disc disease), lumbosacral 01/12/2014  . Arthropathy of lumbar facet joint 01/12/2014  . Upper motor neuron disease ([place]) 01/12/2014  . AAA (abdominal aortic aneurysm) without rupture ([place]) 09/22/2012  . Spinal stenosis, lumbar 09/20/2011  . Routine general medical examination at a health care facility 09/02/2011  . OSA (obstructive sleep apnea) 01/22/2011  . RIGHT BUNDLE BRANCH BLOCK 03/26/2010  . BRADYCARDIA 02/13/2010  . HYPERCHOLESTEROLEMIA 08/02/2009  . ALS 08/02/2009  . HYPERTENSION, MILD 08/02/2009  . GERD 08/02/2009    Gabriela Eves, PT, DPT 06/24/2018, 10:44 AM  [facility] [address] [place], [place], [address] Phone: [phone]   Fax:  [phone]  Name: Edward Frank MRN: [id number] Date of Birth: Mar 24, 1946

## 2018-06-26 ENCOUNTER — Ambulatory Visit: Payer: Medicare Other | Admitting: Physical Therapy

## 2018-06-26 ENCOUNTER — Encounter: Payer: Self-pay | Admitting: Physical Therapy

## 2018-06-26 DIAGNOSIS — M6281 Muscle weakness (generalized): Secondary | ICD-10-CM

## 2018-06-26 DIAGNOSIS — M25512 Pain in left shoulder: Secondary | ICD-10-CM | POA: Diagnosis not present

## 2018-06-26 DIAGNOSIS — M25612 Stiffness of left shoulder, not elsewhere classified: Secondary | ICD-10-CM

## 2018-06-26 NOTE — Therapy (Signed)
[facility] [place], [place], [address] Phone: [phone]   Fax:  [phone]  Physical Therapy Treatment  Patient Details  Name: Edward Frank MRN: [id number] Date of Birth: 02-01-1946 Referring Provider (PT): Jenetta Loges, PA-C/Kevin Supple MD   Encounter Date: 06/26/2018  PT End of Session - 06/26/18 1043    Visit Number  6    Number of Visits  24    Date for PT Re-Evaluation  08/17/18    Authorization Type  Progress note every 10th visit; KX modifier at 15th visit    PT Start Time  0900    PT Stop Time  0958    PT Time Calculation (min)  58 min    Activity Tolerance  Patient tolerated treatment well    Behavior During Therapy  [facility] for tasks assessed/performed       Past Medical History:  Diagnosis Date  . Arthritis   . Bradycardia   . Fatty liver   . Fracture, tibia, with fibula teenage yrs.    no surgery, wore cast  . GERD (gastroesophageal reflux disease)   . Hyperlipidemia   . Hypertension   . 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 OSA (obstructive sleep apnea) 01/22/2011  . PONV (postoperative nausea and vomiting)   . 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 Right bundle branch block   . Syncope     Past Surgical History:  Procedure Laterality Date  . CHOLECYSTECTOMY N/A 06/13/2016   Procedure: LAPAROSCOPIC CHOLECYSTECTOMY;  Surgeon: Arta Bruce Kinsinger, MD;  Location: [facility];  Service: General;  Laterality: N/A;  . EYE SURGERY     Cataracts bil  . FOOT SURGERY  11-2008   hammer toe and bunion  . HERNIA REPAIR  12-2001   inguinal hernia bilateral  . REVERSE SHOULDER ARTHROPLASTY Left 05/28/2018   Procedure: REVERSE SHOULDER ARTHROPLASTY;  Surgeon: Justice Britain, MD;  Location: [facility];  Service: Orthopedics;  Laterality: Left;  118min  . SHOULDER ARTHROSCOPY WITH ROTATOR CUFF REPAIR AND SUBACROMIAL DECOMPRESSION Right 06/18/2012   Procedure: RIGHT  SHOULDER ARTHROSCOPY WITH SUBACROMIAL DECOMPRESSION AND DISTAL CLAVICLE RESECTION AND ROTATOR CUFF REPAIR;  Surgeon: Marin Shutter, MD;  Location: [facility];  Service: Orthopedics;  Laterality: Right;  . SHOULDER SURGERY  1977   Luxating     There were no vitals filed for this visit.  Subjective Assessment - 06/26/18 0943    Subjective  Reports no new complaints    Patient is accompained by:  Family member    Pertinent History  ALS, HTN, abdominal aorta aneurysm, left reverse total shoulder replacement 05/28/2018    Limitations  Lifting;House hold activities    Patient Stated Goals  decrease pain, improve function.    Currently in Pain?  Yes         [facility] PT Assessment - 06/26/18 0001      Assessment   Medical Diagnosis  left reverse total shoulder    Referring Provider (PT)  Jenetta Loges, PA-C/Kevin Supple MD    Onset Date/Surgical Date  05/28/18    Hand Dominance  Right    Next MD Visit  07/06/2018    Prior Therapy  no                   [facility] Adult PT Treatment/Exercise - 06/26/18 0001      Vasopneumatic   Number Minutes Vasopneumatic   15 minutes    Vasopnuematic  Location   Shoulder    Vasopneumatic Pressure  Low      Manual Therapy   Manual Therapy  Passive ROM    Passive ROM  PROM of L shoulder into flexion, ER, IR with gentle holds at end range                  PT Long Term Goals - 06/15/18 1948      PT LONG TERM GOAL #1   Title  Patient will be independent with advanced HEP    Time  8    Period  Weeks    Status  New      PT LONG TERM GOAL #2   Title  Patient will demonstrate 130+ degrees of left shoulder flexion AROM to improve ability to perform functional tasks.    Time  8    Period  Weeks    Status  New      PT LONG TERM GOAL #3   Title  Patient will demonstrate 60+ degrees of left shoulder ER AROM to improve ability to don/doff apparel.    Time  8    Period  Weeks    Status  New      PT LONG TERM GOAL #4   Title  Patient will  demonstrate ability functional IR to L4 or higher to improve ability to perform lower body dressing    Time  8    Period  Weeks    Status  New      PT LONG TERM GOAL #5   Title  Patient will demonstrate 4-/5 or greater of left shoulder MMT to improve stability during functional task.    Time  8    Period  Weeks    Status  New            Plan - 06/26/18 1243    Clinical Impression Statement  Patient was able to tolerate treatment fairly; patient continues to be highly guarded especially with ER. Patient noted with slight reduction in muscle guarding after oscillations. Normal response to modalities upon removal.     Stability/Clinical Decision Making  Stable/Uncomplicated    Clinical Decision Making  Moderate    Rehab Potential  Good    PT Frequency  3x / week    PT Duration  8 weeks    PT Treatment/Interventions  ADLs/Self Care Home Management;Electrical Stimulation;Moist Heat;Cryotherapy;Manual techniques;Passive range of motion;Scar mobilization;Therapeutic exercise;Patient/family education;Therapeutic activities    PT Next Visit Plan  PROM to left shoulder and progress per protocol; modalities PRN for pain relief  5 weeks post op 07/02/2018    Consulted and Agree with Plan of Care  Patient    Family Member Consulted  Wife       Patient will benefit from skilled therapeutic intervention in order to improve the following deficits and impairments:  Pain, Decreased strength, Decreased range of motion, Decreased activity tolerance, Impaired UE functional use, Postural dysfunction  Visit Diagnosis: Acute pain of left shoulder  Stiffness of left shoulder, not elsewhere classified  Muscle weakness (generalized)     Problem List Patient Active Problem List   Diagnosis Date Noted  . S/P reverse total shoulder arthroplasty, left 05/28/2018  . Prediabetes 11/25/2017  . Pain of left shoulder joint on movement 09/11/2017  . Bilateral leg edema 06/03/2017  . Abdominal distension  06/03/2017  . Osteoarthritis of left glenohumeral joint 01/21/2017  . Grade I internal hemorrhoids 11/22/2016  . Left foot pain 11/22/2016  . LVH (left  ventricular hypertrophy) 08/24/2015  . Counseling regarding end of life decision making 11/17/2014  . DDD (degenerative disc disease), lumbosacral 01/12/2014  . Arthropathy of lumbar facet joint 01/12/2014  . Upper motor neuron disease ([place]) 01/12/2014  . AAA (abdominal aortic aneurysm) without rupture ([place]) 09/22/2012  . Spinal stenosis, lumbar 09/20/2011  . Routine general medical examination at a health care facility 09/02/2011  . OSA (obstructive sleep apnea) 01/22/2011  . RIGHT BUNDLE BRANCH BLOCK 03/26/2010  . BRADYCARDIA 02/13/2010  . HYPERCHOLESTEROLEMIA 08/02/2009  . ALS 08/02/2009  . HYPERTENSION, MILD 08/02/2009  . GERD 08/02/2009   Gabriela Eves, PT, DPT 06/26/2018, 12:48 PM  [facility] [address] [place], [place], [address] Phone: [phone]   Fax:  [phone]  Name: HARRISON ZETINA MRN: [id number] Date of Birth: [birth year]

## 2018-06-29 ENCOUNTER — Ambulatory Visit: Payer: Medicare Other | Admitting: Physical Therapy

## 2018-06-29 DIAGNOSIS — M6281 Muscle weakness (generalized): Secondary | ICD-10-CM

## 2018-06-29 DIAGNOSIS — M25512 Pain in left shoulder: Secondary | ICD-10-CM

## 2018-06-29 DIAGNOSIS — M25612 Stiffness of left shoulder, not elsewhere classified: Secondary | ICD-10-CM

## 2018-06-29 NOTE — Therapy (Signed)
[facility] [place], [place], [address] Phone: [phone]   Fax:  [phone]  Physical Therapy Treatment  Patient Details  Name: Edward Frank MRN: [id number] Date of Birth: Aug 25, 1945 Referring Provider (PT): Jenetta Loges, PA-C/Kevin Supple MD   Encounter Date: 06/29/2018  PT End of Session - 06/29/18 1030    Visit Number  7    Number of Visits  24    Date for PT Re-Evaluation  08/17/18    Authorization Type  Progress note every 10th visit; KX modifier at 15th visit    PT Start Time  1033    PT Stop Time  1115    PT Time Calculation (min)  42 min    Activity Tolerance  Patient tolerated treatment well    Behavior During Therapy  [facility] for tasks assessed/performed       Past Medical History:  Diagnosis Date  . Arthritis   . Bradycardia   . Fatty liver   . Fracture, tibia, with fibula teenage yrs.    no surgery, wore cast  . GERD (gastroesophageal reflux disease)   . Hyperlipidemia   . Hypertension   . 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 OSA (obstructive sleep apnea) 01/22/2011  . PONV (postoperative nausea and vomiting)   . 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 Right bundle branch block   . Syncope     Past Surgical History:  Procedure Laterality Date  . CHOLECYSTECTOMY N/A 06/13/2016   Procedure: LAPAROSCOPIC CHOLECYSTECTOMY;  Surgeon: Arta Bruce Kinsinger, MD;  [facility];  Service: General;  Laterality: N/A;  . EYE SURGERY     Cataracts bil  . FOOT SURGERY  11-2008   hammer toe and bunion  . HERNIA REPAIR  12-2001   inguinal hernia bilateral  . REVERSE SHOULDER ARTHROPLASTY Left 05/28/2018   Procedure: REVERSE SHOULDER ARTHROPLASTY;  Surgeon: Justice Britain, MD;  [facility];  Service: Orthopedics;  Laterality: Left;  112min  . SHOULDER ARTHROSCOPY WITH ROTATOR CUFF REPAIR AND SUBACROMIAL DECOMPRESSION Right 06/18/2012   Procedure: RIGHT  SHOULDER ARTHROSCOPY WITH SUBACROMIAL DECOMPRESSION AND DISTAL CLAVICLE RESECTION AND ROTATOR CUFF REPAIR;  Surgeon: Marin Shutter, MD;  Location: [facility];  Service: Orthopedics;  Laterality: Right;  . SHOULDER SURGERY  1977   Luxating     There were no vitals filed for this visit.  Subjective Assessment - 06/29/18 1030    Subjective  Reports more stiffness upon waking as he thinks he may have slept on it some.    Patient is accompained by:  --    Pertinent History  ALS, HTN, abdominal aorta aneurysm, left reverse total shoulder replacement 05/28/2018    Limitations  Lifting;House hold activities    Patient Stated Goals  decrease pain, improve function.    Currently in Pain?  Yes    Pain Score  3     Pain Location  Shoulder    Pain Orientation  Left    Pain Descriptors / Indicators  Tightness;Other (Comment)   Stiffness   Pain Type  Surgical pain    Pain Onset  1 to 4 weeks ago         [facility] PT Assessment - 06/29/18 0001      Assessment   Medical Diagnosis  left reverse total shoulder    Referring Provider (PT)  Jenetta Loges, PA-C/Kevin Supple MD    Onset Date/Surgical Date  05/28/18  Hand Dominance  Right    Next MD Visit  07/06/2018    Prior Therapy  no      Precautions   Precautions  Shoulder    Type of Shoulder Precautions  follow Reverse TSA protocol      Restrictions   Weight Bearing Restrictions  Yes                   [facility] Adult PT Treatment/Exercise - 06/29/18 0001      Modalities   Modalities  Vasopneumatic      Vasopneumatic   Number Minutes Vasopneumatic   15 minutes    Vasopnuematic Location   Shoulder    Vasopneumatic Pressure  Low    Vasopneumatic Temperature   34      Manual Therapy   Manual Therapy  Passive ROM    Passive ROM  PROM of L shoulder into flexion, ER, IR with gentle holds at end range                  PT Long Term Goals - 06/29/18 1104      PT LONG TERM GOAL #1   Title  Patient will be independent with  advanced HEP    Time  8    Period  Weeks    Status  On-going      PT LONG TERM GOAL #2   Title  Patient will demonstrate 130+ degrees of left shoulder flexion AROM to improve ability to perform functional tasks.    Time  8    Period  Weeks    Status  On-going      PT LONG TERM GOAL #3   Title  Patient will demonstrate 60+ degrees of left shoulder ER AROM to improve ability to don/doff apparel.    Time  8    Period  Weeks    Status  On-going      PT LONG TERM GOAL #4   Title  Patient will demonstrate ability functional IR to L4 or higher to improve ability to perform lower body dressing    Time  8    Period  Weeks    Status  On-going      PT LONG TERM GOAL #5   Title  Patient will demonstrate 4-/5 or greater of left shoulder MMT to improve stability during functional task.    Time  8    Period  Weeks    Status  On-going            Plan - 06/29/18 1101    Clinical Impression Statement  Patient presented in clinic with reports of increased stiffness which he reports is always more upon waking. Patient very sensitive and guarded to PROM of L shoulder ER which demonstrates the most limitation. Firm end feels and smooth arc of motion noted during PROM of L shoulder into all directions. Normal vasopneumatic response noted following removal of the modalities.    Stability/Clinical Decision Making  Stable/Uncomplicated    Rehab Potential  Good    PT Frequency  3x / week    PT Duration  8 weeks    PT Treatment/Interventions  ADLs/Self Care Home Management;Electrical Stimulation;Moist Heat;Cryotherapy;Manual techniques;Passive range of motion;Scar mobilization;Therapeutic exercise;Patient/family education;Therapeutic activities    PT Next Visit Plan  PROM to left shoulder and progress per protocol; modalities PRN for pain relief  5 weeks post op 07/02/2018    PT Home Exercise Plan  continue pendulums and AROM elbow flexion provided by surgeon  Consulted and Agree with Plan of Care   Patient       Patient will benefit from skilled therapeutic intervention in order to improve the following deficits and impairments:  Pain, Decreased strength, Decreased range of motion, Decreased activity tolerance, Impaired UE functional use, Postural dysfunction  Visit Diagnosis: Acute pain of left shoulder  Stiffness of left shoulder, not elsewhere classified  Muscle weakness (generalized)     Problem List Patient Active Problem List   Diagnosis Date Noted  . S/P reverse total shoulder arthroplasty, left 05/28/2018  . Prediabetes 11/25/2017  . Pain of left shoulder joint on movement 09/11/2017  . Bilateral leg edema 06/03/2017  . Abdominal distension 06/03/2017  . Osteoarthritis of left glenohumeral joint 01/21/2017  . Grade I internal hemorrhoids 11/22/2016  . Left foot pain 11/22/2016  . LVH (left ventricular hypertrophy) 08/24/2015  . Counseling regarding end of life decision making 11/17/2014  . DDD (degenerative disc disease), lumbosacral 01/12/2014  . Arthropathy of lumbar facet joint 01/12/2014  . Upper motor neuron disease ([place]) 01/12/2014  . AAA (abdominal aortic aneurysm) without rupture ([place]) 09/22/2012  . Spinal stenosis, lumbar 09/20/2011  . Routine general medical examination at a health care facility 09/02/2011  . OSA (obstructive sleep apnea) 01/22/2011  . RIGHT BUNDLE BRANCH BLOCK 03/26/2010  . BRADYCARDIA 02/13/2010  . HYPERCHOLESTEROLEMIA 08/02/2009  . ALS 08/02/2009  . HYPERTENSION, MILD 08/02/2009  . GERD 08/02/2009    Standley Brooking, PTA 06/29/2018, 11:24 AM  [facility] [address] [place], [place], [address] Phone: [phone]   Fax:  [phone]  Name: ARGEL PABLO MRN: [id number] Date of Birth: 05/09/1945

## 2018-07-01 ENCOUNTER — Ambulatory Visit: Payer: Medicare Other | Admitting: Physical Therapy

## 2018-07-01 ENCOUNTER — Other Ambulatory Visit: Payer: Self-pay

## 2018-07-01 DIAGNOSIS — M25512 Pain in left shoulder: Secondary | ICD-10-CM

## 2018-07-01 DIAGNOSIS — M6281 Muscle weakness (generalized): Secondary | ICD-10-CM

## 2018-07-01 DIAGNOSIS — M25612 Stiffness of left shoulder, not elsewhere classified: Secondary | ICD-10-CM | POA: Diagnosis not present

## 2018-07-01 NOTE — Therapy (Signed)
[facility] [place], [place], [address] Phone: [phone]   Fax:  [phone]  Physical Therapy Treatment  Patient Details  Name: Edward Frank MRN: [id number] Date of Birth: Jul 22, 1945 Referring Provider (PT): Jenetta Loges, PA-C/Kevin Supple MD   Encounter Date: 07/01/2018  PT End of Session - 07/01/18 1045    Visit Number  8    Number of Visits  24    Date for PT Re-Evaluation  08/17/18    Authorization Type  Progress note every 10th visit; KX modifier at 15th visit    PT Start Time  1030    PT Stop Time  1112    PT Time Calculation (min)  42 min    Activity Tolerance  Patient tolerated treatment well    Behavior During Therapy  [facility] for tasks assessed/performed       Past Medical History:  Diagnosis Date  . Arthritis   . Bradycardia   . Fatty liver   . Fracture, tibia, with fibula teenage yrs.    no surgery, wore cast  . GERD (gastroesophageal reflux disease)   . Hyperlipidemia   . Hypertension   . 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 OSA (obstructive sleep apnea) 01/22/2011  . PONV (postoperative nausea and vomiting)   . 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 Right bundle branch block   . Syncope     Past Surgical History:  Procedure Laterality Date  . CHOLECYSTECTOMY N/A 06/13/2016   Procedure: LAPAROSCOPIC CHOLECYSTECTOMY;  Surgeon: Arta Bruce Kinsinger, MD;  [facility];  Service: General;  Laterality: N/A;  . EYE SURGERY     Cataracts bil  . FOOT SURGERY  11-2008   hammer toe and bunion  . HERNIA REPAIR  12-2001   inguinal hernia bilateral  . REVERSE SHOULDER ARTHROPLASTY Left 05/28/2018   Procedure: REVERSE SHOULDER ARTHROPLASTY;  Surgeon: Justice Britain, MD;  [facility];  Service: Orthopedics;  Laterality: Left;  142min  . SHOULDER ARTHROSCOPY WITH ROTATOR CUFF REPAIR AND SUBACROMIAL DECOMPRESSION Right 06/18/2012   Procedure: RIGHT  SHOULDER ARTHROSCOPY WITH SUBACROMIAL DECOMPRESSION AND DISTAL CLAVICLE RESECTION AND ROTATOR CUFF REPAIR;  Surgeon: Marin Shutter, MD;  Location: [facility];  Service: Orthopedics;  Laterality: Right;  . SHOULDER SURGERY  1977   Luxating     There were no vitals filed for this visit.  Subjective Assessment - 07/01/18 1034    Subjective  Patient reported soreness in shoulder upon arrival    Patient is accompained by:  Family member    Pertinent History  ALS, HTN, abdominal aorta aneurysm, left reverse total shoulder replacement 05/28/2018    Limitations  Lifting;House hold activities    Patient Stated Goals  decrease pain, improve function.    Currently in Pain?  Yes    Pain Score  3     Pain Location  Shoulder    Pain Orientation  Left    Pain Descriptors / Indicators  Sore;Tightness    Pain Type  Surgical pain    Pain Onset  More than a month ago    Pain Frequency  Intermittent    Aggravating Factors   increased movement    Pain Relieving Factors  at rest                       [facility] Adult PT Treatment/Exercise - 07/01/18 0001      Vasopneumatic  Number Minutes Vasopneumatic   15 minutes    Vasopnuematic Location   Shoulder    Vasopneumatic Pressure  Low      Manual Therapy   Manual Therapy  Passive ROM    Passive ROM  PROM of L shoulder into flexion, ER, IR with gentle holds at end range                  PT Long Term Goals - 06/29/18 1104      PT LONG TERM GOAL #1   Title  Patient will be independent with advanced HEP    Time  8    Period  Weeks    Status  On-going      PT LONG TERM GOAL #2   Title  Patient will demonstrate 130+ degrees of left shoulder flexion AROM to improve ability to perform functional tasks.    Time  8    Period  Weeks    Status  On-going      PT LONG TERM GOAL #3   Title  Patient will demonstrate 60+ degrees of left shoulder ER AROM to improve ability to don/doff apparel.    Time  8    Period  Weeks    Status   On-going      PT LONG TERM GOAL #4   Title  Patient will demonstrate ability functional IR to L4 or higher to improve ability to perform lower body dressing    Time  8    Period  Weeks    Status  On-going      PT LONG TERM GOAL #5   Title  Patient will demonstrate 4-/5 or greater of left shoulder MMT to improve stability during functional task.    Time  8    Period  Weeks    Status  On-going            Plan - 07/01/18 1057    Clinical Impression Statement  Patient tolerated treatment well today. Patient has guarding with PROM and requires ossilations to relax. Patient has increased soreness with ER movements and increased stiffness with flexion movements. Patient ROM improved thus far. Goals ongoing.     Stability/Clinical Decision Making  Stable/Uncomplicated    Rehab Potential  Good    PT Frequency  3x / week    PT Duration  8 weeks    PT Treatment/Interventions  ADLs/Self Care Home Management;Electrical Stimulation;Moist Heat;Cryotherapy;Manual techniques;Passive range of motion;Scar mobilization;Therapeutic exercise;Patient/family education;Therapeutic activities    PT Next Visit Plan  PROM to left shoulder and progress per protocol; modalities PRN for pain relief MD F/U appt next week 5 weeks post op 07/02/18    Consulted and Agree with Plan of Care  Patient       Patient will benefit from skilled therapeutic intervention in order to improve the following deficits and impairments:  Pain, Decreased strength, Decreased range of motion, Decreased activity tolerance, Impaired UE functional use, Postural dysfunction  Visit Diagnosis: Acute pain of left shoulder  Stiffness of left shoulder, not elsewhere classified  Muscle weakness (generalized)     Problem List Patient Active Problem List   Diagnosis Date Noted  . S/P reverse total shoulder arthroplasty, left 05/28/2018  . Prediabetes 11/25/2017  . Pain of left shoulder joint on movement 09/11/2017  . Bilateral leg  edema 06/03/2017  . Abdominal distension 06/03/2017  . Osteoarthritis of left glenohumeral joint 01/21/2017  . Grade I internal hemorrhoids 11/22/2016  . Left foot pain 11/22/2016  .  LVH (left ventricular hypertrophy) 08/24/2015  . Counseling regarding end of life decision making 11/17/2014  . DDD (degenerative disc disease), lumbosacral 01/12/2014  . Arthropathy of lumbar facet joint 01/12/2014  . Upper motor neuron disease ([place]) 01/12/2014  . AAA (abdominal aortic aneurysm) without rupture ([place]) 09/22/2012  . Spinal stenosis, lumbar 09/20/2011  . Routine general medical examination at a health care facility 09/02/2011  . OSA (obstructive sleep apnea) 01/22/2011  . RIGHT BUNDLE BRANCH BLOCK 03/26/2010  . BRADYCARDIA 02/13/2010  . HYPERCHOLESTEROLEMIA 08/02/2009  . ALS 08/02/2009  . HYPERTENSION, MILD 08/02/2009  . GERD 08/02/2009    Dhruv Danyal Adorno P, PTA 07/01/2018, 11:14 AM  [facility] [place], [place], [address] Phone: [phone]   Fax:  [phone]  Name: Edward Frank MRN: [id number] Date of Birth: 03-06-1946

## 2018-07-03 ENCOUNTER — Ambulatory Visit: Payer: Medicare Other | Admitting: Physical Therapy

## 2018-07-03 DIAGNOSIS — Z471 Aftercare following joint replacement surgery: Secondary | ICD-10-CM | POA: Diagnosis not present

## 2018-07-03 DIAGNOSIS — M25522 Pain in left elbow: Secondary | ICD-10-CM | POA: Diagnosis not present

## 2018-07-03 DIAGNOSIS — Z96612 Presence of left artificial shoulder joint: Secondary | ICD-10-CM | POA: Diagnosis not present

## 2018-07-07 ENCOUNTER — Ambulatory Visit: Payer: Medicare Other | Admitting: Physical Therapy

## 2018-07-09 ENCOUNTER — Ambulatory Visit: Payer: Medicare Other | Admitting: Physical Therapy

## 2018-07-09 ENCOUNTER — Other Ambulatory Visit: Payer: Self-pay

## 2018-07-09 DIAGNOSIS — M25512 Pain in left shoulder: Secondary | ICD-10-CM | POA: Diagnosis not present

## 2018-07-09 DIAGNOSIS — M25612 Stiffness of left shoulder, not elsewhere classified: Secondary | ICD-10-CM

## 2018-07-09 DIAGNOSIS — M6281 Muscle weakness (generalized): Secondary | ICD-10-CM

## 2018-07-09 NOTE — Therapy (Addendum)
[facility] [place], [place], [address] Phone: [phone]   Fax:  [phone]  Physical Therapy Treatment  Patient Details  Name: Edward Frank MRN: [id number] Date of Birth: [birth year] Referring Provider (PT): Jenetta Loges, PA-C/Kevin Supple MD   Encounter Date: 07/09/2018  PT End of Session - 07/09/18 1045    Visit Number  9    Number of Visits  24    Date for PT Re-Evaluation  08/17/18    Authorization Type  Progress note every 10th visit; KX modifier at 15th visit    PT Start Time  1039    PT Stop Time  1129    PT Time Calculation (min)  50 min    Activity Tolerance  Patient tolerated treatment well    Behavior During Therapy  [facility] for tasks assessed/performed       Past Medical History:  Diagnosis Date  . Arthritis   . Bradycardia   . Fatty liver   . Fracture, tibia, with fibula teenage yrs.    no surgery, wore cast  . GERD (gastroesophageal reflux disease)   . Hyperlipidemia   . Hypertension   . 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 OSA (obstructive sleep apnea) 01/22/2011  . PONV (postoperative nausea and vomiting)   . 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 Right bundle branch block   . Syncope     Past Surgical History:  Procedure Laterality Date  . CHOLECYSTECTOMY N/A 06/13/2016   Procedure: LAPAROSCOPIC CHOLECYSTECTOMY;  Surgeon: Arta Bruce Kinsinger, MD;  Location: [facility];  Service: General;  Laterality: N/A;  . EYE SURGERY     Cataracts bil  . FOOT SURGERY  11-2008   hammer toe and bunion  . HERNIA REPAIR  12-2001   inguinal hernia bilateral  . REVERSE SHOULDER ARTHROPLASTY Left 05/28/2018   Procedure: REVERSE SHOULDER ARTHROPLASTY;  Surgeon: Justice Britain, MD;  Location: [facility];  Service: Orthopedics;  Laterality: Left;  153min  . SHOULDER ARTHROSCOPY WITH ROTATOR CUFF REPAIR AND SUBACROMIAL DECOMPRESSION Right 06/18/2012   Procedure: RIGHT  SHOULDER ARTHROSCOPY WITH SUBACROMIAL DECOMPRESSION AND DISTAL CLAVICLE RESECTION AND ROTATOR CUFF REPAIR;  Surgeon: Marin Shutter, MD;  Location: [place];  Service: Orthopedics;  Laterality: Right;  . SHOULDER SURGERY  1977   Luxating     There were no vitals filed for this visit.  Subjective Assessment - 07/09/18 1040    Subjective  Reports soreness from a fall last week. Patient taken for xrays but no fractures noted.    Patient is accompained by:  Family member   Wife   Pertinent History  ALS, HTN, abdominal aorta aneurysm, left reverse total shoulder replacement 05/28/2018    Limitations  Lifting;House hold activities    Patient Stated Goals  decrease pain, improve function.    Currently in Pain?  Yes    Pain Score  4     Pain Location  Shoulder    Pain Orientation  Left    Pain Descriptors / Indicators  Tightness;Tiring    Pain Type  Surgical pain    Pain Onset  More than a month ago                               PT Education - 07/10/18 1121    Education Details  HEP- table slides into flexion, ER and AROM upper cuts  HEP made on 07/10/2018 and mailed out on 07/10/2018   Person(s) Educated  Spouse   Mailed to patient due to forced closure of PT clinic; Patient called and educated regarding the mailing of the HEP and will be contacted for check in   Methods  Explanation;Handout    Comprehension  Need further instruction;Verbalized understanding          PT Long Term Goals - 06/29/18 1104      PT LONG TERM GOAL #1   Title  Patient will be independent with advanced HEP    Time  8    Period  Weeks    Status  On-going      PT LONG TERM GOAL #2   Title  Patient will demonstrate 130+ degrees of left shoulder flexion AROM to improve ability to perform functional tasks.    Time  8    Period  Weeks    Status  On-going      PT LONG TERM GOAL #3   Title  Patient will demonstrate 60+ degrees of left shoulder ER AROM to improve ability to don/doff  apparel.    Time  8    Period  Weeks    Status  On-going      PT LONG TERM GOAL #4   Title  Patient will demonstrate ability functional IR to L4 or higher to improve ability to perform lower body dressing    Time  8    Period  Weeks    Status  On-going      PT LONG TERM GOAL #5   Title  Patient will demonstrate 4-/5 or greater of left shoulder MMT to improve stability during functional task.    Time  8    Period  Weeks    Status  On-going            Plan - 07/09/18 1156    Clinical Impression Statement  Patient presented in clinic with reports of tightness and soreness from a fall last week. Patient able to tolerate AAROM exercises with no complaints. Low level isometrics initiated per protocol with no complaints of pain but patient much weaker in ER. Firm end feels and smooth arc of motion noted during PROM of L shoulder. Patient requested very gentle PROM of L shoulder ER due to tenderness and discomfort. Normal modalities response noted following removal of the modalities.        Stability/Clinical Decision Making  Stable/Uncomplicated    Rehab Potential  Good    PT Frequency  3x / week    PT Duration  8 weeks    PT Treatment/Interventions  ADLs/Self Care Home Management;Electrical Stimulation;Moist Heat;Cryotherapy;Manual techniques;Passive range of motion;Scar mobilization;Therapeutic exercise;Patient/family education;Therapeutic activities    PT Next Visit Plan  PROM to left shoulder and progress per protocol; modalities PRN for pain relief     PT Home Exercise Plan  continue pendulums and AROM elbow flexion provided by surgeon    Consulted and Agree with Plan of Care  Patient    Family Member Consulted  Wife       Patient will benefit from skilled therapeutic intervention in order to improve the following deficits and impairments:  Pain, Decreased strength, Decreased range of motion, Decreased activity tolerance, Impaired UE functional use, Postural dysfunction  Visit  Diagnosis: Acute pain of left shoulder  Stiffness of left shoulder, not elsewhere classified  Muscle weakness (generalized)     Problem List Patient Active Problem List   Diagnosis Date Noted  .  S/P reverse total shoulder arthroplasty, left 05/28/2018  . Prediabetes 11/25/2017  . Pain of left shoulder joint on movement 09/11/2017  . Bilateral leg edema 06/03/2017  . Abdominal distension 06/03/2017  . Osteoarthritis of left glenohumeral joint 01/21/2017  . Grade I internal hemorrhoids 11/22/2016  . Left foot pain 11/22/2016  . LVH (left ventricular hypertrophy) 08/24/2015  . Counseling regarding end of life decision making 11/17/2014  . DDD (degenerative disc disease), lumbosacral 01/12/2014  . Arthropathy of lumbar facet joint 01/12/2014  . Upper motor neuron disease ([place]) 01/12/2014  . AAA (abdominal aortic aneurysm) without rupture ([place]) 09/22/2012  . Spinal stenosis, lumbar 09/20/2011  . Routine general medical examination at a health care facility 09/02/2011  . OSA (obstructive sleep apnea) 01/22/2011  . RIGHT BUNDLE BRANCH BLOCK 03/26/2010  . BRADYCARDIA 02/13/2010  . HYPERCHOLESTEROLEMIA 08/02/2009  . ALS 08/02/2009  . HYPERTENSION, MILD 08/02/2009  . GERD 08/02/2009    Standley Brooking, PTA 07/10/18 11:28 AM   [facility] [place], [place], [address] Phone: [phone]   Fax:  [phone]  Name: Edward Frank MRN: [id number] Date of Birth: November 10, 1945

## 2018-07-13 ENCOUNTER — Ambulatory Visit: Payer: Medicare Other | Admitting: Physical Therapy

## 2018-07-15 ENCOUNTER — Encounter: Payer: Medicare Other | Admitting: Physical Therapy

## 2018-07-17 ENCOUNTER — Encounter: Payer: Medicare Other | Admitting: Physical Therapy

## 2018-07-21 ENCOUNTER — Encounter: Payer: Self-pay | Admitting: Physical Therapy

## 2018-07-21 ENCOUNTER — Ambulatory Visit: Payer: Medicare Other | Admitting: Physical Therapy

## 2018-07-21 ENCOUNTER — Other Ambulatory Visit: Payer: Self-pay

## 2018-07-21 DIAGNOSIS — M6281 Muscle weakness (generalized): Secondary | ICD-10-CM

## 2018-07-21 DIAGNOSIS — M25612 Stiffness of left shoulder, not elsewhere classified: Secondary | ICD-10-CM

## 2018-07-21 DIAGNOSIS — M25512 Pain in left shoulder: Secondary | ICD-10-CM

## 2018-07-21 NOTE — Therapy (Signed)
[facility] [place], [place], [address] Phone: [phone]   Fax:  [phone]  Physical Therapy Treatment Progress Note Reporting Period 06/15/2018 to 07/21/2018  See note below for Objective Data and Assessment of Progress/Goals.  Patient is progressing with his protocol. Goals are ongoing at this time. Gabriela Eves, PT, DPT     Patient Details  Name: Edward Frank MRN: [id number] Date of Birth: [birth year] Referring Provider (PT): Jenetta Loges, PA-C/Kevin Supple MD   Encounter Date: 07/21/2018  PT End of Session - 07/21/18 0935    Visit Number  10    Number of Visits  24    Date for PT Re-Evaluation  08/17/18    Authorization Type  Progress note every 10th visit; KX modifier at 15th visit    PT Start Time  0933    PT Stop Time  1023    PT Time Calculation (min)  50 min    Activity Tolerance  Patient tolerated treatment well    Behavior During Therapy  [facility] for tasks assessed/performed       Past Medical History:  Diagnosis Date  . Arthritis   . Bradycardia   . Fatty liver   . Fracture, tibia, with fibula teenage yrs.    no surgery, wore cast  . GERD (gastroesophageal reflux disease)   . Hyperlipidemia   . Hypertension   . 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 OSA (obstructive sleep apnea) 01/22/2011  . PONV (postoperative nausea and vomiting)   . 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 Right bundle branch block   . Syncope     Past Surgical History:  Procedure Laterality Date  . CHOLECYSTECTOMY N/A 06/13/2016   Procedure: LAPAROSCOPIC CHOLECYSTECTOMY;  Surgeon: Arta Bruce Kinsinger, MD;  [facility];  Service: General;  Laterality: N/A;  . EYE SURGERY     Cataracts bil  . FOOT SURGERY  11-2008   hammer toe and bunion  . HERNIA REPAIR  12-2001   inguinal hernia bilateral  . REVERSE SHOULDER ARTHROPLASTY Left 05/28/2018   Procedure:  REVERSE SHOULDER ARTHROPLASTY;  Surgeon: Justice Britain, MD;  [facility];  Service: Orthopedics;  Laterality: Left;  132min  . SHOULDER ARTHROSCOPY WITH ROTATOR CUFF REPAIR AND SUBACROMIAL DECOMPRESSION Right 06/18/2012   Procedure: RIGHT SHOULDER ARTHROSCOPY WITH SUBACROMIAL DECOMPRESSION AND DISTAL CLAVICLE RESECTION AND ROTATOR CUFF REPAIR;  Surgeon: Marin Shutter, MD;  Location: [place];  Service: Orthopedics;  Laterality: Right;  . SHOULDER SURGERY  1977   Luxating     There were no vitals filed for this visit.  Subjective Assessment - 07/21/18 0934    Subjective  Reports discomfort with intermittant activities. COVID 19 screening performed on patient prior to entering building.  (Pended)     Pertinent History  ALS, HTN, abdominal aorta aneurysm, left reverse total shoulder replacement 05/28/2018    Limitations  Lifting;House hold activities    Patient Stated Goals  decrease pain, improve function.    Currently in Pain?  Yes    Pain Score  2     Pain Location  Shoulder    Pain Orientation  Left;Anterior    Pain Descriptors / Indicators  Tightness    Pain Type  Surgical pain    Pain Onset  More than a month ago    Pain Frequency  Intermittent         [facility] PT Assessment - 07/21/18 0001  Assessment   Medical Diagnosis  left reverse total shoulder    Referring Provider (PT)  Jenetta Loges, PA-C/Kevin Supple MD    Onset Date/Surgical Date  05/28/18    Hand Dominance  Right    Next MD Visit  07/06/2018    Prior Therapy  no      Precautions   Precautions  Shoulder    Type of Shoulder Precautions  follow Reverse TSA protocol      Restrictions   Weight Bearing Restrictions  Yes                   [facility] Adult PT Treatment/Exercise - 07/21/18 0001      Shoulder Exercises: Supine   Protraction  AAROM;Both;20 reps    External Rotation  AAROM;Left;20 reps    Flexion  AAROM;Both;20 reps      Shoulder Exercises: Seated   Other Seated Exercises  Table slides  into flex, ER x20 reps each      Shoulder Exercises: Pulleys   Flexion  5 minutes      Shoulder Exercises: ROM/Strengthening   Ranger  Seated ranger into flex, CW and CCWcircles       Modalities   Modalities  Vasopneumatic      Vasopneumatic   Number Minutes Vasopneumatic   15 minutes    Vasopnuematic Location   Shoulder    Vasopneumatic Pressure  Low    Vasopneumatic Temperature   68      Manual Therapy   Manual Therapy  Passive ROM    Passive ROM  PROM of L shoulder into flexion, ER, IR with gentle holds at end range                  PT Long Term Goals - 06/29/18 1104      PT LONG TERM GOAL #1   Title  Patient will be independent with advanced HEP    Time  8    Period  Weeks    Status  On-going      PT LONG TERM GOAL #2   Title  Patient will demonstrate 130+ degrees of left shoulder flexion AROM to improve ability to perform functional tasks.    Time  8    Period  Weeks    Status  On-going      PT LONG TERM GOAL #3   Title  Patient will demonstrate 60+ degrees of left shoulder ER AROM to improve ability to don/doff apparel.    Time  8    Period  Weeks    Status  On-going      PT LONG TERM GOAL #4   Title  Patient will demonstrate ability functional IR to L4 or higher to improve ability to perform lower body dressing    Time  8    Period  Weeks    Status  On-going      PT LONG TERM GOAL #5   Title  Patient will demonstrate 4-/5 or greater of left shoulder MMT to improve stability during functional task.    Time  8    Period  Weeks    Status  On-going            Plan - 07/21/18 1042    Clinical Impression Statement  Patient presented in clinic with reports of tightness of L anterior shoulder as movement started. Patient guided through [facility] exercises for HEP instructions. No complaints with any exercises completed in PT. PROM of L shoulder completed with firm  end feels and smooth arc of motion noted in all directions assessed. Normal  modalities  response noted following removal of the modalities.     Stability/Clinical Decision Making  Stable/Uncomplicated    Rehab Potential  Good    PT Frequency  3x / week    PT Duration  8 weeks    PT Treatment/Interventions  ADLs/Self Care Home Management;Electrical Stimulation;Moist Heat;Cryotherapy;Manual techniques;Passive range of motion;Scar mobilization;Therapeutic exercise;Patient/family education;Therapeutic activities    PT Next Visit Plan  Continue with AAROM per protocol.    PT Home Exercise Plan  HEP- upper cuts, table ER and flexion slides    Consulted and Agree with Plan of Care  Patient       Patient will benefit from skilled therapeutic intervention in order to improve the following deficits and impairments:  Pain, Decreased strength, Decreased range of motion, Decreased activity tolerance, Impaired UE functional use, Postural dysfunction  Visit Diagnosis: Acute pain of left shoulder  Stiffness of left shoulder, not elsewhere classified  Muscle weakness (generalized)     Problem List Patient Active Problem List   Diagnosis Date Noted  . S/P reverse total shoulder arthroplasty, left 05/28/2018  . Prediabetes 11/25/2017  . Pain of left shoulder joint on movement 09/11/2017  . Bilateral leg edema 06/03/2017  . Abdominal distension 06/03/2017  . Osteoarthritis of left glenohumeral joint 01/21/2017  . Grade I internal hemorrhoids 11/22/2016  . Left foot pain 11/22/2016  . LVH (left ventricular hypertrophy) 08/24/2015  . Counseling regarding end of life decision making 11/17/2014  . DDD (degenerative disc disease), lumbosacral 01/12/2014  . Arthropathy of lumbar facet joint 01/12/2014  . Upper motor neuron disease ([place]) 01/12/2014  . AAA (abdominal aortic aneurysm) without rupture ([place]) 09/22/2012  . Spinal stenosis, lumbar 09/20/2011  . Routine general medical examination at a health care facility 09/02/2011  . OSA (obstructive sleep apnea) 01/22/2011  .  RIGHT BUNDLE BRANCH BLOCK 03/26/2010  . BRADYCARDIA 02/13/2010  . HYPERCHOLESTEROLEMIA 08/02/2009  . ALS 08/02/2009  . HYPERTENSION, MILD 08/02/2009  . GERD 08/02/2009    Standley Brooking, PTA 07/21/2018, 11:00 AM  [facility] [place], [place], [address] Phone: [phone]   Fax:  [phone]  Name: Edward Frank MRN: [id number] Date of Birth: Dec 14, 1945

## 2018-07-23 ENCOUNTER — Encounter: Payer: Self-pay | Admitting: Physical Therapy

## 2018-07-23 ENCOUNTER — Ambulatory Visit: Payer: Medicare Other | Attending: Orthopedic Surgery | Admitting: Physical Therapy

## 2018-07-23 ENCOUNTER — Other Ambulatory Visit: Payer: Self-pay

## 2018-07-23 DIAGNOSIS — M6281 Muscle weakness (generalized): Secondary | ICD-10-CM | POA: Diagnosis not present

## 2018-07-23 DIAGNOSIS — M25512 Pain in left shoulder: Secondary | ICD-10-CM

## 2018-07-23 DIAGNOSIS — M25612 Stiffness of left shoulder, not elsewhere classified: Secondary | ICD-10-CM

## 2018-07-23 NOTE — Therapy (Signed)
[facility] [place], [place], [address] Phone: [phone]   Fax:  [phone]  Physical Therapy Treatment  Patient Details  Name: Edward Frank MRN: [id number] Date of Birth: [birth year] Referring Provider (PT): Jenetta Loges, PA-C/Kevin Supple MD   Encounter Date: 07/23/2018  PT End of Session - 07/23/18 0939    Visit Number  11    Number of Visits  24    Date for PT Re-Evaluation  08/17/18    Authorization Type  Progress note every 10th visit; KX modifier at 15th visit    PT Start Time  0936    PT Stop Time  1028    PT Time Calculation (min)  52 min    Activity Tolerance  Patient tolerated treatment well    Behavior During Therapy  [facility] for tasks assessed/performed       Past Medical History:  Diagnosis Date  . Arthritis   . Bradycardia   . Fatty liver   . Fracture, tibia, with fibula teenage yrs.    no surgery, wore cast  . GERD (gastroesophageal reflux disease)   . Hyperlipidemia   . Hypertension   . 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 OSA (obstructive sleep apnea) 01/22/2011  . PONV (postoperative nausea and vomiting)   . 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 Right bundle branch block   . Syncope     Past Surgical History:  Procedure Laterality Date  . CHOLECYSTECTOMY N/A 06/13/2016   Procedure: LAPAROSCOPIC CHOLECYSTECTOMY;  Surgeon: Arta Bruce Kinsinger, MD;  [facility];  Service: General;  Laterality: N/A;  . EYE SURGERY     Cataracts bil  . FOOT SURGERY  11-2008   hammer toe and bunion  . HERNIA REPAIR  12-2001   inguinal hernia bilateral  . REVERSE SHOULDER ARTHROPLASTY Left 05/28/2018   Procedure: REVERSE SHOULDER ARTHROPLASTY;  Surgeon: Justice Britain, MD;  [facility];  Service: Orthopedics;  Laterality: Left;  178min  . SHOULDER ARTHROSCOPY WITH ROTATOR CUFF REPAIR AND SUBACROMIAL DECOMPRESSION Right 06/18/2012   Procedure: RIGHT  SHOULDER ARTHROSCOPY WITH SUBACROMIAL DECOMPRESSION AND DISTAL CLAVICLE RESECTION AND ROTATOR CUFF REPAIR;  Surgeon: Marin Shutter, MD;  Location: [place];  Service: Orthopedics;  Laterality: Right;  . SHOULDER SURGERY  1977   Luxating     There were no vitals filed for this visit.  Subjective Assessment - 07/23/18 0938    Subjective  Reports his shoulder feeling a little better but still has some tightness. COVID 19 screening performed on patient prior to entering bulding.    Pertinent History  ALS, HTN, abdominal aorta aneurysm, left reverse total shoulder replacement 05/28/2018    Limitations  Lifting;House hold activities    Patient Stated Goals  decrease pain, improve function.    Currently in Pain?  Yes    Pain Score  2     Pain Location  Shoulder    Pain Orientation  Left;Anterior    Pain Descriptors / Indicators  Tightness;Discomfort    Pain Type  Surgical pain    Pain Onset  More than a month ago    Pain Frequency  Intermittent    Aggravating Factors   Movement         [facility] PT Assessment - 07/23/18 0001      Assessment   Medical Diagnosis  left reverse total shoulder    Referring Provider (PT)  Jenetta Loges, PA-C/Kevin Supple MD  Onset Date/Surgical Date  05/28/18    Hand Dominance  Right    Next MD Visit  07/06/2018    Prior Therapy  no      Precautions   Precautions  Shoulder    Type of Shoulder Precautions  follow Reverse TSA protocol      Restrictions   Weight Bearing Restrictions  Yes                   [facility] Adult PT Treatment/Exercise - 07/23/18 0001      Shoulder Exercises: Standing   Protraction  AAROM;Both;20 reps    External Rotation  AAROM;Left;20 reps    Flexion  AAROM;Left;20 reps      Shoulder Exercises: Pulleys   Flexion  5 minutes      Shoulder Exercises: ROM/Strengthening   Ranger  Seated ranger into flex, CW and CCW circles     Other ROM/Strengthening Exercises  Wall ladder x5 reps (level 30)      Modalities   Modalities   Vasopneumatic      Vasopneumatic   Number Minutes Vasopneumatic   15 minutes    Vasopnuematic Location   Shoulder    Vasopneumatic Pressure  Low    Vasopneumatic Temperature   61      Manual Therapy   Manual Therapy  Passive ROM;Soft tissue mobilization    Soft tissue mobilization  STW to L pectoralis and anterior deltoid to reduce tightness of anterior L shoulder    Passive ROM  PROM of L shoulder into flexion, ER, IR with gentle holds at end range                  PT Long Term Goals - 06/29/18 1104      PT LONG TERM GOAL #1   Title  Patient will be independent with advanced HEP    Time  8    Period  Weeks    Status  On-going      PT LONG TERM GOAL #2   Title  Patient will demonstrate 130+ degrees of left shoulder flexion AROM to improve ability to perform functional tasks.    Time  8    Period  Weeks    Status  On-going      PT LONG TERM GOAL #3   Title  Patient will demonstrate 60+ degrees of left shoulder ER AROM to improve ability to don/doff apparel.    Time  8    Period  Weeks    Status  On-going      PT LONG TERM GOAL #4   Title  Patient will demonstrate ability functional IR to L4 or higher to improve ability to perform lower body dressing    Time  8    Period  Weeks    Status  On-going      PT LONG TERM GOAL #5   Title  Patient will demonstrate 4-/5 or greater of left shoulder MMT to improve stability during functional task.    Time  8    Period  Weeks    Status  On-going            Plan - 07/23/18 1051    Clinical Impression Statement  Patient presented in clinic with reduction of L shoulder discomfort and tightness. Patient guided through [facility] exercises with no complaints of pain. Patient required VCs to complete exercises slow and controlled to avoid pain. Firm end feels and smooth arc of motion noted during PROM of L shoulder.  Patient more limited with L shoulder ER. Normal vasopneumatic response noted following removal of the  modality.    Stability/Clinical Decision Making  Stable/Uncomplicated    Rehab Potential  Good    PT Frequency  3x / week    PT Duration  8 weeks    PT Treatment/Interventions  ADLs/Self Care Home Management;Electrical Stimulation;Moist Heat;Cryotherapy;Manual techniques;Passive range of motion;Scar mobilization;Therapeutic exercise;Patient/family education;Therapeutic activities    PT Next Visit Plan  Continue with AAROM per protocol.    PT Home Exercise Plan  HEP- upper cuts, table ER and flexion slides    Consulted and Agree with Plan of Care  Patient       Patient will benefit from skilled therapeutic intervention in order to improve the following deficits and impairments:  Pain, Decreased strength, Decreased range of motion, Decreased activity tolerance, Impaired UE functional use, Postural dysfunction  Visit Diagnosis: Acute pain of left shoulder  Stiffness of left shoulder, not elsewhere classified  Muscle weakness (generalized)     Problem List Patient Active Problem List   Diagnosis Date Noted  . S/P reverse total shoulder arthroplasty, left 05/28/2018  . Prediabetes 11/25/2017  . Pain of left shoulder joint on movement 09/11/2017  . Bilateral leg edema 06/03/2017  . Abdominal distension 06/03/2017  . Osteoarthritis of left glenohumeral joint 01/21/2017  . Grade I internal hemorrhoids 11/22/2016  . Left foot pain 11/22/2016  . LVH (left ventricular hypertrophy) 08/24/2015  . Counseling regarding end of life decision making 11/17/2014  . DDD (degenerative disc disease), lumbosacral 01/12/2014  . Arthropathy of lumbar facet joint 01/12/2014  . 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 AAA (abdominal aortic aneurysm) without rupture ([place]) 09/22/2012  . Spinal stenosis, lumbar 09/20/2011  . Routine general medical examination at a health care facility 09/02/2011  . OSA (obstructive sleep apnea) 01/22/2011  . RIGHT BUNDLE BRANCH BLOCK 03/26/2010  . BRADYCARDIA  02/13/2010  . HYPERCHOLESTEROLEMIA 08/02/2009  . ALS 08/02/2009  . HYPERTENSION, MILD 08/02/2009  . GERD 08/02/2009    Standley Brooking, PTA 07/23/2018, 11:16 AM  [facility] [address] [place], [place], [address] Phone: [phone]   Fax:  [phone]  Name: Edward Frank MRN: [id number] Date of Birth: 1945-08-04

## 2018-07-28 ENCOUNTER — Ambulatory Visit: Payer: Medicare Other | Admitting: Physical Therapy

## 2018-07-28 ENCOUNTER — Other Ambulatory Visit: Payer: Self-pay

## 2018-07-28 ENCOUNTER — Encounter: Payer: Self-pay | Admitting: Physical Therapy

## 2018-07-28 DIAGNOSIS — M25512 Pain in left shoulder: Secondary | ICD-10-CM

## 2018-07-28 DIAGNOSIS — M25612 Stiffness of left shoulder, not elsewhere classified: Secondary | ICD-10-CM

## 2018-07-28 DIAGNOSIS — M6281 Muscle weakness (generalized): Secondary | ICD-10-CM

## 2018-07-28 NOTE — Therapy (Signed)
[facility] [place], [place], [address] Phone: [phone]   Fax:  [phone]  Physical Therapy Treatment  Patient Details  Name: Edward Frank MRN: [id number] Date of Birth: [birth year] Referring Provider (PT): Jenetta Loges, PA-C/Kevin Supple MD   Encounter Date: 07/28/2018  PT End of Session - 07/28/18 1301    Visit Number  12    Number of Visits  24    Date for PT Re-Evaluation  08/17/18    Authorization Type  Progress note every 10th visit; KX modifier at 15th visit    PT Start Time  1259    PT Stop Time  1402    PT Time Calculation (min)  63 min    Activity Tolerance  Patient tolerated treatment well    Behavior During Therapy  [facility] for tasks assessed/performed       Past Medical History:  Diagnosis Date  . Arthritis   . Bradycardia   . Fatty liver   . Fracture, tibia, with fibula teenage yrs.    no surgery, wore cast  . GERD (gastroesophageal reflux disease)   . Hyperlipidemia   . Hypertension   . 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 OSA (obstructive sleep apnea) 01/22/2011  . PONV (postoperative nausea and vomiting)   . 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 Right bundle branch block   . Syncope     Past Surgical History:  Procedure Laterality Date  . CHOLECYSTECTOMY N/A 06/13/2016   Procedure: LAPAROSCOPIC CHOLECYSTECTOMY;  Surgeon: Arta Bruce Kinsinger, MD;  [facility]: [facility];  Service: General;  Laterality: N/A;  . EYE SURGERY     Cataracts bil  . FOOT SURGERY  11-2008   hammer toe and bunion  . HERNIA REPAIR  12-2001   inguinal hernia bilateral  . REVERSE SHOULDER ARTHROPLASTY Left 05/28/2018   Procedure: REVERSE SHOULDER ARTHROPLASTY;  Surgeon: Justice Britain, MD;  [facility];  Service: Orthopedics;  Laterality: Left;  19min  . SHOULDER ARTHROSCOPY WITH ROTATOR CUFF REPAIR AND SUBACROMIAL DECOMPRESSION Right 06/18/2012   Procedure: RIGHT  SHOULDER ARTHROSCOPY WITH SUBACROMIAL DECOMPRESSION AND DISTAL CLAVICLE RESECTION AND ROTATOR CUFF REPAIR;  Surgeon: Marin Shutter, MD;  Location: [place];  Service: Orthopedics;  Laterality: Right;  . SHOULDER SURGERY  1977   Luxating     There were no vitals filed for this visit.  Subjective Assessment - 07/28/18 1259    Subjective  Reports tightness in R shoulder upon arrival. COVID 19 screening performed on patient prior to entering building.    Pertinent History  ALS, HTN, abdominal aorta aneurysm, left reverse total shoulder replacement 05/28/2018    Limitations  Lifting;House hold activities    Patient Stated Goals  decrease pain, improve function.    Currently in Pain?  Yes    Pain Score  3     Pain Location  Shoulder    Pain Orientation  Left;Anterior    Pain Descriptors / Indicators  Tightness    Pain Type  Surgical pain    Pain Onset  More than a month ago    Pain Frequency  Intermittent         [facility] PT Assessment - 07/28/18 0001      Assessment   Medical Diagnosis  left reverse total shoulder    Referring Provider (PT)  Jenetta Loges, PA-C/Kevin Supple MD    Onset Date/Surgical Date  05/28/18    Hand Dominance  Right    Next MD Visit  08/14/2018    Prior Therapy  no      Precautions   Precautions  Shoulder    Type of Shoulder Precautions  follow Reverse TSA protocol      Restrictions   Weight Bearing Restrictions  Yes                   [facility] Adult PT Treatment/Exercise - 07/28/18 0001      Shoulder Exercises: Supine   Protraction  AAROM;Both;20 reps    External Rotation  AAROM;Right;20 reps    Flexion  AAROM;Both;20 reps      Shoulder Exercises: Seated   Other Seated Exercises  L upper cut x20    Other Seated Exercises  L bicep curl 3# x20 reps      Shoulder Exercises: Standing   Other Standing Exercises  Wall ladder x10 reps   level 31-30     Shoulder Exercises: Pulleys   Flexion  5 minutes      Shoulder Exercises: ROM/Strengthening    Ranger  Seated ranger into flex, CW and CCW circles     Ball on Wall  on table, flex, circles       Modalities   Modalities  Vasopneumatic      Vasopneumatic   Number Minutes Vasopneumatic   15 minutes    Vasopnuematic Location   Shoulder    Vasopneumatic Pressure  Low    Vasopneumatic Temperature   34      Manual Therapy   Manual Therapy  Passive ROM;Soft tissue mobilization    Soft tissue mobilization  STW to L pectoralis and anterior deltoid to reduce tightness of anterior L shoulder    Passive ROM  PROM of L shoulder into flexion, ER, IR with gentle holds at end range                  PT Long Term Goals - 07/28/18 1357      PT LONG TERM GOAL #1   Title  Patient will be independent with advanced HEP    Time  8    Period  Weeks    Status  Achieved      PT LONG TERM GOAL #2   Title  Patient will demonstrate 130+ degrees of left shoulder flexion AROM to improve ability to perform functional tasks.    Time  8    Period  Weeks    Status  On-going      PT LONG TERM GOAL #3   Title  Patient will demonstrate 60+ degrees of left shoulder ER AROM to improve ability to don/doff apparel.    Time  8    Period  Weeks    Status  On-going      PT LONG TERM GOAL #4   Title  Patient will demonstrate ability functional IR to L4 or higher to improve ability to perform lower body dressing    Time  8    Period  Weeks    Status  On-going      PT LONG TERM GOAL #5   Title  Patient will demonstrate 4-/5 or greater of left shoulder MMT to improve stability during functional task.    Time  8    Period  Weeks    Status  On-going            Plan - 07/28/18 1355    Clinical Impression Statement  Patient presented in clinic with continued tightness of  L pectoralis. Deficit of L bicep and shoulder flexor strengthen noted today as well. Patient still very guarded and limited with ER. Improved ER noted with pressure and STW to L pectoralis. Firm end feels and smooth arc of  motion noted during PROM of L shoulder. Normal vasopneumatic response noted following removal of the modalities.    Stability/Clinical Decision Making  Stable/Uncomplicated    Rehab Potential  Good    PT Frequency  3x / week    PT Duration  8 weeks    PT Treatment/Interventions  ADLs/Self Care Home Management;Electrical Stimulation;Moist Heat;Cryotherapy;Manual techniques;Passive range of motion;Scar mobilization;Therapeutic exercise;Patient/family education;Therapeutic activities    PT Next Visit Plan  Continue with AAROM per protocol.    PT Home Exercise Plan  HEP- upper cuts, table ER and flexion slides    Consulted and Agree with Plan of Care  Patient       Patient will benefit from skilled therapeutic intervention in order to improve the following deficits and impairments:  Pain, Decreased strength, Decreased range of motion, Decreased activity tolerance, Impaired UE functional use, Postural dysfunction  Visit Diagnosis: Acute pain of left shoulder  Stiffness of left shoulder, not elsewhere classified  Muscle weakness (generalized)     Problem List Patient Active Problem List   Diagnosis Date Noted  . S/P reverse total shoulder arthroplasty, left 05/28/2018  . Prediabetes 11/25/2017  . Pain of left shoulder joint on movement 09/11/2017  . Bilateral leg edema 06/03/2017  . Abdominal distension 06/03/2017  . Osteoarthritis of left glenohumeral joint 01/21/2017  . Grade I internal hemorrhoids 11/22/2016  . Left foot pain 11/22/2016  . LVH (left ventricular hypertrophy) 08/24/2015  . Counseling regarding end of life decision making 11/17/2014  . DDD (degenerative disc disease), lumbosacral 01/12/2014  . Arthropathy of lumbar facet joint 01/12/2014  . Upper motor neuron disease ([place]) 01/12/2014  . AAA (abdominal aortic aneurysm) without rupture ([place]) 09/22/2012  . Spinal stenosis, lumbar 09/20/2011  . Routine general medical examination at a health care facility 09/02/2011   . OSA (obstructive sleep apnea) 01/22/2011  . RIGHT BUNDLE BRANCH BLOCK 03/26/2010  . BRADYCARDIA 02/13/2010  . HYPERCHOLESTEROLEMIA 08/02/2009  . ALS 08/02/2009  . HYPERTENSION, MILD 08/02/2009  . GERD 08/02/2009    Standley Brooking, PTA 07/28/2018, 2:22 PM  [facility] [address] [place], [place], [address] Phone: [phone]   Fax:  [phone]  Name: Edward Frank MRN: [id number] Date of Birth: Jul 12, 1945

## 2018-07-30 ENCOUNTER — Encounter: Payer: Self-pay | Admitting: Physical Therapy

## 2018-07-30 ENCOUNTER — Ambulatory Visit: Payer: Medicare Other | Admitting: Physical Therapy

## 2018-07-30 ENCOUNTER — Other Ambulatory Visit: Payer: Self-pay

## 2018-07-30 DIAGNOSIS — M6281 Muscle weakness (generalized): Secondary | ICD-10-CM

## 2018-07-30 DIAGNOSIS — M25512 Pain in left shoulder: Secondary | ICD-10-CM

## 2018-07-30 DIAGNOSIS — M25612 Stiffness of left shoulder, not elsewhere classified: Secondary | ICD-10-CM

## 2018-07-30 NOTE — Therapy (Signed)
[facility] [place], [place], [address] Phone: [phone]   Fax:  [phone]  Physical Therapy Treatment  Patient Details  Name: Edward Frank MRN: [id number] Date of Birth: July 29, 1945 Referring Provider (PT): Jenetta Loges, PA-C/Kevin Supple MD   Encounter Date: 07/30/2018  PT End of Session - 07/30/18 1220    Visit Number  13    Number of Visits  24    Date for PT Re-Evaluation  08/17/18    Authorization Type  Progress note every 10th visit; KX modifier at 15th visit    PT Start Time  1113    PT Stop Time  1210    PT Time Calculation (min)  57 min    Activity Tolerance  Patient tolerated treatment well    Behavior During Therapy  [facility] for tasks assessed/performed       Past Medical History:  Diagnosis Date  . Arthritis   . Bradycardia   . Fatty liver   . Fracture, tibia, with fibula teenage yrs.    no surgery, wore cast  . GERD (gastroesophageal reflux disease)   . Hyperlipidemia   . Hypertension   . 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 OSA (obstructive sleep apnea) 01/22/2011  . PONV (postoperative nausea and vomiting)   . 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 Right bundle branch block   . Syncope     Past Surgical History:  Procedure Laterality Date  . CHOLECYSTECTOMY N/A 06/13/2016   Procedure: LAPAROSCOPIC CHOLECYSTECTOMY;  Surgeon: Arta Bruce Kinsinger, MD;  [facility];  Service: General;  Laterality: N/A;  . EYE SURGERY     Cataracts bil  . FOOT SURGERY  11-2008   hammer toe and bunion  . HERNIA REPAIR  12-2001   inguinal hernia bilateral  . REVERSE SHOULDER ARTHROPLASTY Left 05/28/2018   Procedure: REVERSE SHOULDER ARTHROPLASTY;  Surgeon: Justice Britain, MD;  [facility];  Service: Orthopedics;  Laterality: Left;  143min  . SHOULDER ARTHROSCOPY WITH ROTATOR CUFF REPAIR AND SUBACROMIAL DECOMPRESSION Right 06/18/2012   Procedure: RIGHT  SHOULDER ARTHROSCOPY WITH SUBACROMIAL DECOMPRESSION AND DISTAL CLAVICLE RESECTION AND ROTATOR CUFF REPAIR;  Surgeon: Marin Shutter, MD;  Location: [facility];  Service: Orthopedics;  Laterality: Right;  . SHOULDER SURGERY  1977   Luxating     There were no vitals filed for this visit.  Subjective Assessment - 07/30/18 1132    Subjective  COVID-19 screening performed prior to patient entering the building.  Patient reported no new complaints.    Pertinent History  ALS, HTN, abdominal aorta aneurysm, left reverse total shoulder replacement 05/28/2018    Limitations  Lifting;House hold activities    Patient Stated Goals  decrease pain, improve function.    Currently in Pain?  Yes    Pain Score  3     Pain Location  Shoulder    Pain Orientation  Left;Anterior    Pain Descriptors / Indicators  Tightness    Pain Type  Surgical pain    Pain Onset  More than a month ago    Pain Frequency  Intermittent         [facility] PT Assessment - 07/30/18 0001      Assessment   Medical Diagnosis  left reverse total shoulder    Referring Provider (PT)  Jenetta Loges, PA-C/Kevin Supple MD    Onset Date/Surgical Date  05/28/18    Hand Dominance  Right  Next MD Visit  08/14/2018    Prior Therapy  no      Precautions   Precautions  Shoulder    Type of Shoulder Precautions  follow Reverse TSA protocol      Restrictions   Weight Bearing Restrictions  Yes                   [facility] Adult PT Treatment/Exercise - 07/30/18 0001      Shoulder Exercises: Supine   Protraction  AAROM;Both;20 reps    External Rotation  AAROM;Right;20 reps    Flexion  AAROM;Both;20 reps      Shoulder Exercises: Standing   Other Standing Exercises  Wall ladder 3 minutes       Shoulder Exercises: Pulleys   Flexion  5 minutes      Shoulder Exercises: ROM/Strengthening   Wall Wash  clockwise, counterclockwise x3 mins each      Modalities   Modalities  Vasopneumatic      Vasopneumatic   Number Minutes  Vasopneumatic   15 minutes    Vasopnuematic Location   Shoulder    Vasopneumatic Pressure  Low    Vasopneumatic Temperature   34      Manual Therapy   Manual Therapy  Passive ROM;Soft tissue mobilization    Soft tissue mobilization  STW to L pectoralis and anterior deltoid to reduce tightness of anterior L shoulder    Passive ROM  PROM of L shoulder into flexion, ER, IR with gentle holds at end range                  PT Long Term Goals - 07/28/18 1357      PT LONG TERM GOAL #1   Title  Patient will be independent with advanced HEP    Time  8    Period  Weeks    Status  Achieved      PT LONG TERM GOAL #2   Title  Patient will demonstrate 130+ degrees of left shoulder flexion AROM to improve ability to perform functional tasks.    Time  8    Period  Weeks    Status  On-going      PT LONG TERM GOAL #3   Title  Patient will demonstrate 60+ degrees of left shoulder ER AROM to improve ability to don/doff apparel.    Time  8    Period  Weeks    Status  On-going      PT LONG TERM GOAL #4   Title  Patient will demonstrate ability functional IR to L4 or higher to improve ability to perform lower body dressing    Time  8    Period  Weeks    Status  On-going      PT LONG TERM GOAL #5   Title  Patient will demonstrate 4-/5 or greater of left shoulder MMT to improve stability during functional task.    Time  8    Period  Weeks    Status  On-going            Plan - 07/30/18 1221    Clinical Impression Statement  Patient was able to tolerate progression of treatment well despite reports of ongoing pain. Patient noted with increased pain with PROM ER. Patient instructed to continue HEP. Normal response to modalities upon removal.     Stability/Clinical Decision Making  Stable/Uncomplicated    Clinical Decision Making  Moderate    Rehab Potential  Good  PT Frequency  3x / week    PT Duration  8 weeks    PT Treatment/Interventions  ADLs/Self Care Home  Management;Electrical Stimulation;Moist Heat;Cryotherapy;Manual techniques;Passive range of motion;Scar mobilization;Therapeutic exercise;Patient/family education;Therapeutic activities    PT Next Visit Plan  Continue with AAROM per protocol.    PT Home Exercise Plan  HEP- upper cuts, table ER and flexion slides    Consulted and Agree with Plan of Care  Patient       Patient will benefit from skilled therapeutic intervention in order to improve the following deficits and impairments:  Pain, Decreased strength, Decreased range of motion, Decreased activity tolerance, Impaired UE functional use, Postural dysfunction  Visit Diagnosis: Acute pain of left shoulder  Stiffness of left shoulder, not elsewhere classified  Muscle weakness (generalized)     Problem List Patient Active Problem List   Diagnosis Date Noted  . S/P reverse total shoulder arthroplasty, left 05/28/2018  . Prediabetes 11/25/2017  . Pain of left shoulder joint on movement 09/11/2017  . Bilateral leg edema 06/03/2017  . Abdominal distension 06/03/2017  . Osteoarthritis of left glenohumeral joint 01/21/2017  . Grade I internal hemorrhoids 11/22/2016  . Left foot pain 11/22/2016  . LVH (left ventricular hypertrophy) 08/24/2015  . Counseling regarding end of life decision making 11/17/2014  . DDD (degenerative disc disease), lumbosacral 01/12/2014  . Arthropathy of lumbar facet joint 01/12/2014  . Upper motor neuron disease ([place]) 01/12/2014  . AAA (abdominal aortic aneurysm) without rupture ([place]) 09/22/2012  . Spinal stenosis, lumbar 09/20/2011  . Routine general medical examination at a health care facility 09/02/2011  . OSA (obstructive sleep apnea) 01/22/2011  . RIGHT BUNDLE BRANCH BLOCK 03/26/2010  . BRADYCARDIA 02/13/2010  . HYPERCHOLESTEROLEMIA 08/02/2009  . ALS 08/02/2009  . HYPERTENSION, MILD 08/02/2009  . GERD 08/02/2009   Gabriela Eves, PT, DPT 07/30/2018, 12:37 PM  [facility] [place], [place], [address] Phone: [phone]   Fax:  [phone]  Name: ORLIE CUNDARI MRN: [id number] Date of Birth: 1945/12/06

## 2018-08-04 ENCOUNTER — Ambulatory Visit: Payer: Medicare Other | Admitting: Physical Therapy

## 2018-08-04 ENCOUNTER — Encounter: Payer: Self-pay | Admitting: Physical Therapy

## 2018-08-04 ENCOUNTER — Other Ambulatory Visit: Payer: Self-pay

## 2018-08-04 DIAGNOSIS — M25512 Pain in left shoulder: Secondary | ICD-10-CM | POA: Diagnosis not present

## 2018-08-04 DIAGNOSIS — M6281 Muscle weakness (generalized): Secondary | ICD-10-CM | POA: Diagnosis not present

## 2018-08-04 DIAGNOSIS — M25612 Stiffness of left shoulder, not elsewhere classified: Secondary | ICD-10-CM | POA: Diagnosis not present

## 2018-08-04 NOTE — Therapy (Signed)
[facility] [place], [place], [address] Phone: [phone]   Fax:  [phone]  Physical Therapy Treatment  Patient Details  Name: Edward Frank MRN: [id number] Date of Birth: Apr 01, 1946 Referring Provider (PT): Jenetta Loges, PA-C/Kevin Supple MD   Encounter Date: 08/04/2018  PT End of Session - 08/04/18 0945    Visit Number  14    Number of Visits  24    Date for PT Re-Evaluation  08/17/18    Authorization Type  Progress note every 10th visit; KX modifier at 15th visit    PT Start Time  0943    PT Stop Time  1040    PT Time Calculation (min)  57 min    Activity Tolerance  Patient tolerated treatment well    Behavior During Therapy  [facility] for tasks assessed/performed       Past Medical History:  Diagnosis Date  . Arthritis   . Bradycardia   . Fatty liver   . Fracture, tibia, with fibula teenage yrs.    no surgery, wore cast  . GERD (gastroesophageal reflux disease)   . Hyperlipidemia   . Hypertension   . 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 OSA (obstructive sleep apnea) 01/22/2011  . PONV (postoperative nausea and vomiting)   . 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 Right bundle branch block   . Syncope     Past Surgical History:  Procedure Laterality Date  . CHOLECYSTECTOMY N/A 06/13/2016   Procedure: LAPAROSCOPIC CHOLECYSTECTOMY;  Surgeon: Arta Bruce Kinsinger, MD;  [facility];  Service: General;  Laterality: N/A;  . EYE SURGERY     Cataracts bil  . FOOT SURGERY  11-2008   hammer toe and bunion  . HERNIA REPAIR  12-2001   inguinal hernia bilateral  . REVERSE SHOULDER ARTHROPLASTY Left 05/28/2018   Procedure: REVERSE SHOULDER ARTHROPLASTY;  Surgeon: Justice Britain, MD;  [facility];  Service: Orthopedics;  Laterality: Left;  157min  . SHOULDER ARTHROSCOPY WITH ROTATOR CUFF REPAIR AND SUBACROMIAL DECOMPRESSION Right 06/18/2012   Procedure:  RIGHT SHOULDER ARTHROSCOPY WITH SUBACROMIAL DECOMPRESSION AND DISTAL CLAVICLE RESECTION AND ROTATOR CUFF REPAIR;  Surgeon: Marin Shutter, MD;  Location: [facility];  Service: Orthopedics;  Laterality: Right;  . SHOULDER SURGERY  1977   Luxating     There were no vitals filed for this visit.  Subjective Assessment - 08/04/18 0945    Subjective  COVID-19 screening performed prior to patient entering the building.  Patient reported no new complaints.    Pertinent History  ALS, HTN, abdominal aorta aneurysm, left reverse total shoulder replacement 05/28/2018    Limitations  Lifting;House hold activities    Patient Stated Goals  decrease pain, improve function.    Currently in Pain?  No/denies         [facility] PT Assessment - 08/04/18 0001      Assessment   Medical Diagnosis  left reverse total shoulder    Referring Provider (PT)  Jenetta Loges, PA-C/Kevin Supple MD    Onset Date/Surgical Date  05/28/18    Hand Dominance  Right    Next MD Visit  08/14/2018    Prior Therapy  no      Precautions   Precautions  Shoulder    Type of Shoulder Precautions  follow Reverse TSA protocol      Restrictions   Weight Bearing Restrictions  Yes  [facility] Adult PT Treatment/Exercise - 08/04/18 0001      Exercises   Exercises  Shoulder;Elbow      Elbow Exercises   Elbow Flexion  Strengthening;Left;20 reps;Seated;Bar weights/barbell      Shoulder Exercises: Supine   Protraction  AAROM;Both;20 reps    External Rotation  AAROM;Right;20 reps    Flexion  AAROM;AROM;Left;20 reps      Shoulder Exercises: Seated   Other Seated Exercises  L upper cut x20      Shoulder Exercises: Standing   Other Standing Exercises  Wall ladder x10 reps    level 30-31     Shoulder Exercises: Pulleys   Flexion  5 minutes      Shoulder Exercises: ROM/Strengthening   Ranger  Seated ranger into flex x2 min, CW and CCW circles x2 min    Wall Wash  clockwise, counterclockwise x20 reps each       Modalities   Modalities  Vasopneumatic      Vasopneumatic   Number Minutes Vasopneumatic   15 minutes    Vasopnuematic Location   Shoulder    Vasopneumatic Pressure  Low    Vasopneumatic Temperature   34      Manual Therapy   Manual Therapy  Passive ROM    Passive ROM  PROM of L shoulder into flexion, ER, IR with gentle holds at end range                  PT Long Term Goals - 07/28/18 1357      PT LONG TERM GOAL #1   Title  Patient will be independent with advanced HEP    Time  8    Period  Weeks    Status  Achieved      PT LONG TERM GOAL #2   Title  Patient will demonstrate 130+ degrees of left shoulder flexion AROM to improve ability to perform functional tasks.    Time  8    Period  Weeks    Status  On-going      PT LONG TERM GOAL #3   Title  Patient will demonstrate 60+ degrees of left shoulder ER AROM to improve ability to don/doff apparel.    Time  8    Period  Weeks    Status  On-going      PT LONG TERM GOAL #4   Title  Patient will demonstrate ability functional IR to L4 or higher to improve ability to perform lower body dressing    Time  8    Period  Weeks    Status  On-going      PT LONG TERM GOAL #5   Title  Patient will demonstrate 4-/5 or greater of left shoulder MMT to improve stability during functional task.    Time  8    Period  Weeks    Status  On-going            Plan - 08/04/18 1037    Clinical Impression Statement  Patient presented in clinic with no complaints of L shoulder pain. Upon waking L shoulder continues to report low level L shoulder discomfort and stiffness but overall improving with pain. Patient guided through more AAROM/AROM exercises without complaint today. Patient still limited and hesitant with [facility] ER and PROM ER. Firm end feels and smooth arc of motion with all directions of PROM. Normal vasopnuematic response noted following removal of the modality.    Stability/Clinical Decision Making  Stable/Uncomplicated     Rehab  Potential  Good    PT Frequency  3x / week    PT Duration  8 weeks    PT Treatment/Interventions  ADLs/Self Care Home Management;Electrical Stimulation;Moist Heat;Cryotherapy;Manual techniques;Passive range of motion;Scar mobilization;Therapeutic exercise;Patient/family education;Therapeutic activities    PT Next Visit Plan  Continue with AAROM per protocol.    PT Home Exercise Plan  HEP- upper cuts, table ER and flexion slides    Consulted and Agree with Plan of Care  Patient       Patient will benefit from skilled therapeutic intervention in order to improve the following deficits and impairments:  Pain, Decreased strength, Decreased range of motion, Decreased activity tolerance, Impaired UE functional use, Postural dysfunction  Visit Diagnosis: Acute pain of left shoulder  Stiffness of left shoulder, not elsewhere classified  Muscle weakness (generalized)     Problem List Patient Active Problem List   Diagnosis Date Noted  . S/P reverse total shoulder arthroplasty, left 05/28/2018  . Prediabetes 11/25/2017  . Pain of left shoulder joint on movement 09/11/2017  . Bilateral leg edema 06/03/2017  . Abdominal distension 06/03/2017  . Osteoarthritis of left glenohumeral joint 01/21/2017  . Grade I internal hemorrhoids 11/22/2016  . Left foot pain 11/22/2016  . LVH (left ventricular hypertrophy) 08/24/2015  . Counseling regarding end of life decision making 11/17/2014  . DDD (degenerative disc disease), lumbosacral 01/12/2014  . Arthropathy of lumbar facet joint 01/12/2014  . Upper motor neuron disease ([place]) 01/12/2014  . AAA (abdominal aortic aneurysm) without rupture ([place]) 09/22/2012  . Spinal stenosis, lumbar 09/20/2011  . Routine general medical examination at a health care facility 09/02/2011  . OSA (obstructive sleep apnea) 01/22/2011  . RIGHT BUNDLE BRANCH BLOCK 03/26/2010  . BRADYCARDIA 02/13/2010  . HYPERCHOLESTEROLEMIA 08/02/2009  . ALS 08/02/2009  .  HYPERTENSION, MILD 08/02/2009  . GERD 08/02/2009    Standley Brooking, PTA 08/04/2018, 11:02 AM  [facility] [place], [place], [address] Phone: [phone]   Fax:  [phone]  Name: Edward Frank MRN: [id number] Date of Birth: [birth year]

## 2018-08-06 ENCOUNTER — Ambulatory Visit: Payer: Medicare Other | Admitting: Physical Therapy

## 2018-08-06 ENCOUNTER — Other Ambulatory Visit: Payer: Self-pay

## 2018-08-06 DIAGNOSIS — M25612 Stiffness of left shoulder, not elsewhere classified: Secondary | ICD-10-CM

## 2018-08-06 DIAGNOSIS — M25512 Pain in left shoulder: Secondary | ICD-10-CM

## 2018-08-06 DIAGNOSIS — M6281 Muscle weakness (generalized): Secondary | ICD-10-CM

## 2018-08-06 NOTE — Therapy (Addendum)
[facility] [place], [place], [address] Phone: [phone]   Fax:  [phone]  Physical Therapy Treatment  Patient Details  Name: Edward Frank MRN: [id number] Date of Birth: 1945/09/19 Referring Provider (PT): Jenetta Loges, PA-C/Kevin Supple MD   Encounter Date: 08/06/2018  PT End of Session - 08/06/18 1055    Visit Number  15    Number of Visits  24    Date for PT Re-Evaluation  08/17/18    Authorization Type  Progress note every 10th visit; KX modifier at 15th visit    PT Start Time  0946    PT Stop Time  1037    PT Time Calculation (min)  51 min    Activity Tolerance  Patient tolerated treatment well    Behavior During Therapy  [facility] for tasks assessed/performed       Past Medical History:  Diagnosis Date  . Arthritis   . Bradycardia   . Fatty liver   . Fracture, tibia, with fibula teenage yrs.    no surgery, wore cast  . GERD (gastroesophageal reflux disease)   . Hyperlipidemia   . Hypertension   . Neuromuscular disorder (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 OSA (obstructive sleep apnea) 01/22/2011  . PONV (postoperative nausea and vomiting)   . 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 Right bundle branch block   . Syncope     Past Surgical History:  Procedure Laterality Date  . CHOLECYSTECTOMY N/A 06/13/2016   Procedure: LAPAROSCOPIC CHOLECYSTECTOMY;  Surgeon: Arta Bruce Kinsinger, MD;  Location: [facility];  Service: General;  Laterality: N/A;  . EYE SURGERY     Cataracts bil  . FOOT SURGERY  11-2008   hammer toe and bunion  . HERNIA REPAIR  12-2001   inguinal hernia bilateral  . REVERSE SHOULDER ARTHROPLASTY Left 05/28/2018   Procedure: REVERSE SHOULDER ARTHROPLASTY;  Surgeon: Justice Britain, MD;  Location: [facility];  Service: Orthopedics;  Laterality: Left;  1109min  . SHOULDER ARTHROSCOPY WITH ROTATOR CUFF REPAIR AND SUBACROMIAL DECOMPRESSION Right 06/18/2012   Procedure:  RIGHT SHOULDER ARTHROSCOPY WITH SUBACROMIAL DECOMPRESSION AND DISTAL CLAVICLE RESECTION AND ROTATOR CUFF REPAIR;  Surgeon: Marin Shutter, MD;  Location: [place];  Service: Orthopedics;  Laterality: Right;  . SHOULDER SURGERY  1977   Luxating     There were no vitals filed for this visit.  Subjective Assessment - 08/06/18 1055    Subjective  COVID-19  screen performed prior to patient entering clinic.  Patient states his shoulder feels stiff.    Pertinent History  ALS, HTN, abdominal aorta aneurysm, left reverse total shoulder replacement 05/28/2018    Limitations  Lifting;House hold activities    Patient Stated Goals  decrease pain, improve function.    Currently in Pain?  Yes    Pain Score  3     Pain Location  Shoulder    Pain Descriptors / Indicators  --   "Stiff."   Pain Onset  More than a month ago                       [facility] Adult PT Treatment/Exercise - 08/06/18 0001      Exercises   Exercises  Shoulder      Shoulder Exercises: Seated   Other Seated Exercises  Seated UE Ranger x 4 minutes.      Shoulder Exercises: Pulleys   Flexion  5 minutes  Modalities   Modalities  Vasopneumatic      Vasopneumatic   Number Minutes Vasopneumatic   20 minutes    Vasopnuematic Location   --   Left shoulder.   Vasopneumatic Pressure  Low      Manual Therapy   Manual Therapy  Passive ROM    Passive ROM  PROM to left shoulder in supine x 14 minutes into ER and flexion with low load long duration technique ulitized.                  PT Long Term Goals - 07/28/18 1357      PT LONG TERM GOAL #1   Title  Patient will be independent with advanced HEP    Time  8    Period  Weeks    Status  Achieved      PT LONG TERM GOAL #2   Title  Patient will demonstrate 130+ degrees of left shoulder flexion AROM to improve ability to perform functional tasks.    Time  8    Period  Weeks    Status  On-going      PT LONG TERM GOAL #3   Title  Patient will  demonstrate 60+ degrees of left shoulder ER AROM to improve ability to don/doff apparel.    Time  8    Period  Weeks    Status  On-going      PT LONG TERM GOAL #4   Title  Patient will demonstrate ability functional IR to L4 or higher to improve ability to perform lower body dressing    Time  8    Period  Weeks    Status  On-going      PT LONG TERM GOAL #5   Title  Patient will demonstrate 4-/5 or greater of left shoulder MMT to improve stability during functional task.    Time  8    Period  Weeks    Status  On-going            Plan - 08/06/18 1139    Clinical Impression Statement  Patient reporting stiffness of left shoulder.  Patient with continued loss of motion especially into left shoulder flexiona nd ER.      PT Treatment/Interventions  ADLs/Self Care Home Management;Electrical Stimulation;Moist Heat;Cryotherapy;Manual techniques;Passive range of motion;Scar mobilization;Therapeutic exercise;Patient/family education;Therapeutic activities    Family Member Consulted  Wife       Patient will benefit from skilled therapeutic intervention in order to improve the following deficits and impairments:  Pain, Decreased strength, Decreased range of motion, Decreased activity tolerance, Impaired UE functional use, Postural dysfunction  Visit Diagnosis: Acute pain of left shoulder  Stiffness of left shoulder, not elsewhere classified  Muscle weakness (generalized)     Problem List Patient Active Problem List   Diagnosis Date Noted  . S/P reverse total shoulder arthroplasty, left 05/28/2018  . Prediabetes 11/25/2017  . Pain of left shoulder joint on movement 09/11/2017  . Bilateral leg edema 06/03/2017  . Abdominal distension 06/03/2017  . Osteoarthritis of left glenohumeral joint 01/21/2017  . Grade I internal hemorrhoids 11/22/2016  . Left foot pain 11/22/2016  . LVH (left ventricular hypertrophy) 08/24/2015  . Counseling regarding end of life decision making  11/17/2014  . DDD (degenerative disc disease), lumbosacral 01/12/2014  . Arthropathy of lumbar facet joint 01/12/2014  . Upper motor neuron disease ([place]) 01/12/2014  . AAA (abdominal aortic aneurysm) without rupture ([place]) 09/22/2012  . Spinal stenosis, lumbar 09/20/2011  . Routine  general medical examination at a health care facility 09/02/2011  . OSA (obstructive sleep apnea) 01/22/2011  . RIGHT BUNDLE BRANCH BLOCK 03/26/2010  . BRADYCARDIA 02/13/2010  . HYPERCHOLESTEROLEMIA 08/02/2009  . ALS 08/02/2009  . HYPERTENSION, MILD 08/02/2009  . GERD 08/02/2009    Cintya Daughety, [place] MPT 08/06/2018, 11:57 AM  [facility] [place], [place], [address] Phone: [phone]   Fax:  [phone]  Name: RADEN BYINGTON MRN: [id number] Date of Birth: [birth year]

## 2018-08-11 ENCOUNTER — Other Ambulatory Visit: Payer: Self-pay

## 2018-08-11 ENCOUNTER — Encounter: Payer: Self-pay | Admitting: Physical Therapy

## 2018-08-11 ENCOUNTER — Ambulatory Visit: Payer: Medicare Other | Admitting: Physical Therapy

## 2018-08-11 DIAGNOSIS — M25512 Pain in left shoulder: Secondary | ICD-10-CM | POA: Diagnosis not present

## 2018-08-11 DIAGNOSIS — M25612 Stiffness of left shoulder, not elsewhere classified: Secondary | ICD-10-CM

## 2018-08-11 DIAGNOSIS — M6281 Muscle weakness (generalized): Secondary | ICD-10-CM | POA: Diagnosis not present

## 2018-08-11 NOTE — Therapy (Signed)
[facility] [place], [place], [address] Phone: [phone]   Fax:  [phone]  Physical Therapy Treatment  Patient Details  Name: Edward Frank MRN: [id number] Date of Birth: Jul 22, 1945 Referring Provider (PT): Jenetta Loges, PA-C/Kevin Supple MD   Encounter Date: 08/11/2018  PT End of Session - 08/11/18 0951    Visit Number  16    Number of Visits  24    Date for PT Re-Evaluation  08/17/18    Authorization Type  Progress note every 10th visit; KX modifier at 15th visit    PT Start Time  0946    PT Stop Time  1036    PT Time Calculation (min)  50 min    Activity Tolerance  Patient tolerated treatment well    Behavior During Therapy  [facility] for tasks assessed/performed       Past Medical History:  Diagnosis Date  . Arthritis   . Bradycardia   . Fatty liver   . Fracture, tibia, with fibula teenage yrs.    no surgery, wore cast  . GERD (gastroesophageal reflux disease)   . Hyperlipidemia   . Hypertension   . 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 OSA (obstructive sleep apnea) 01/22/2011  . PONV (postoperative nausea and vomiting)   . 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 Right bundle branch block   . Syncope     Past Surgical History:  Procedure Laterality Date  . CHOLECYSTECTOMY N/A 06/13/2016   Procedure: LAPAROSCOPIC CHOLECYSTECTOMY;  Surgeon: Arta Bruce Kinsinger, MD;  [facility];  Service: General;  Laterality: N/A;  . EYE SURGERY     Cataracts bil  . FOOT SURGERY  11-2008   hammer toe and bunion  . HERNIA REPAIR  12-2001   inguinal hernia bilateral  . REVERSE SHOULDER ARTHROPLASTY Left 05/28/2018   Procedure: REVERSE SHOULDER ARTHROPLASTY;  Surgeon: Justice Britain, MD;  [facility];  Service: Orthopedics;  Laterality: Left;  160min  . SHOULDER ARTHROSCOPY WITH ROTATOR CUFF REPAIR AND SUBACROMIAL DECOMPRESSION Right 06/18/2012   Procedure:  RIGHT SHOULDER ARTHROSCOPY WITH SUBACROMIAL DECOMPRESSION AND DISTAL CLAVICLE RESECTION AND ROTATOR CUFF REPAIR;  Surgeon: Marin Shutter, MD;  Location: [place];  Service: Orthopedics;  Laterality: Right;  . SHOULDER SURGERY  1977   Luxating     There were no vitals filed for this visit.  Subjective Assessment - 08/11/18 0950    Subjective  COVID-19  screen performed prior to patient entering clinic.  Patient states his shoulder feels stiff and reports noticable aching last night.    Pertinent History  ALS, HTN, abdominal aorta aneurysm, left reverse total shoulder replacement 05/28/2018    Limitations  Lifting;House hold activities    Patient Stated Goals  decrease pain, improve function.    Currently in Pain?  No/denies         [facility] PT Assessment - 08/11/18 0001      Assessment   Medical Diagnosis  left reverse total shoulder    Referring Provider (PT)  Jenetta Loges, PA-C/Kevin Supple MD    Onset Date/Surgical Date  05/28/18    Hand Dominance  Right    Next MD Visit  08/14/2018    Prior Therapy  no      Precautions   Precautions  Shoulder    Type of Shoulder Precautions  follow Reverse TSA protocol      Restrictions   Weight Bearing Restrictions  Yes  [facility] Adult PT Treatment/Exercise - 08/11/18 0001      Shoulder Exercises: Supine   Protraction  AROM;Left;20 reps    Flexion  AROM;Left;15 reps      Shoulder Exercises: Standing   Other Standing Exercises  Wall ladder x15 reps       Shoulder Exercises: Pulleys   Flexion  5 minutes      Shoulder Exercises: ROM/Strengthening   Ranger  Standing UE ranger into flexion, CW x20 reps   fatigued   Wall Wash  CW circles x20 reps, flexion x10 reps   fatigued     Shoulder Exercises: Stretch   Internal Rotation Stretch  3 reps    Internal Rotation Stretch Limitations  30 sec      Modalities   Modalities  Vasopneumatic      Vasopneumatic   Number Minutes Vasopneumatic   15 minutes     Vasopnuematic Location   Shoulder    Vasopneumatic Pressure  Low    Vasopneumatic Temperature   34      Manual Therapy   Manual Therapy  Passive ROM    Passive ROM  PROM of L shoulder into flex, ER with gentle holds at end range             PT Education - 08/11/18 1035    Education Details  HEP- IT stretch with belt/towel    Person(s) Educated  Patient    Methods  Explanation;Demonstration;Handout    Comprehension  Verbalized understanding          PT Long Term Goals - 07/28/18 1357      PT LONG TERM GOAL #1   Title  Patient will be independent with advanced HEP    Time  8    Period  Weeks    Status  Achieved      PT LONG TERM GOAL #2   Title  Patient will demonstrate 130+ degrees of left shoulder flexion AROM to improve ability to perform functional tasks.    Time  8    Period  Weeks    Status  On-going      PT LONG TERM GOAL #3   Title  Patient will demonstrate 60+ degrees of left shoulder ER AROM to improve ability to don/doff apparel.    Time  8    Period  Weeks    Status  On-going      PT LONG TERM GOAL #4   Title  Patient will demonstrate ability functional IR to L4 or higher to improve ability to perform lower body dressing    Time  8    Period  Weeks    Status  On-going      PT LONG TERM GOAL #5   Title  Patient will demonstrate 4-/5 or greater of left shoulder MMT to improve stability during functional task.    Time  8    Period  Weeks    Status  On-going            Plan - 08/11/18 1027    Clinical Impression Statement  Patient presented in clinic with only complaints of L shoulder stiffness upon arrival. Patient's greatest complaint with therex being of muscle fatigue with antigravity exercises. Patient demonstrated deficits with L shoulder flexion and IR actively. Minimal facial grimacing noted with PROM L shoulder ER as patient still hesitant with motion. Patient able to demonstrate good technique of IR stretch with gait belt and provided  a printed HEP to be completed at home.  FIrm end feels and smooth arc of motion noted during PROM of L shoulder. Normal vasopneumatic response noted following removal of the modality.    Stability/Clinical Decision Making  Stable/Uncomplicated    Rehab Potential  Good    PT Frequency  3x / week    PT Duration  8 weeks    PT Treatment/Interventions  ADLs/Self Care Home Management;Electrical Stimulation;Moist Heat;Cryotherapy;Manual techniques;Passive range of motion;Scar mobilization;Therapeutic exercise;Patient/family education;Therapeutic activities    PT Next Visit Plan  Continue with AAROM/AROM per protocol. MD note required on next visit.    PT Home Exercise Plan  HEP- upper cuts, table ER and flexion slides    Consulted and Agree with Plan of Care  Patient       Patient will benefit from skilled therapeutic intervention in order to improve the following deficits and impairments:  Pain, Decreased strength, Decreased range of motion, Decreased activity tolerance, Impaired UE functional use, Postural dysfunction  Visit Diagnosis: Acute pain of left shoulder  Stiffness of left shoulder, not elsewhere classified  Muscle weakness (generalized)     Problem List Patient Active Problem List   Diagnosis Date Noted  . S/P reverse total shoulder arthroplasty, left 05/28/2018  . Prediabetes 11/25/2017  . Pain of left shoulder joint on movement 09/11/2017  . Bilateral leg edema 06/03/2017  . Abdominal distension 06/03/2017  . Osteoarthritis of left glenohumeral joint 01/21/2017  . Grade I internal hemorrhoids 11/22/2016  . Left foot pain 11/22/2016  . LVH (left ventricular hypertrophy) 08/24/2015  . Counseling regarding end of life decision making 11/17/2014  . DDD (degenerative disc disease), lumbosacral 01/12/2014  . Arthropathy of lumbar facet joint 01/12/2014  . 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 AAA (abdominal aortic aneurysm) without rupture ([place]) 09/22/2012  . Spinal  stenosis, lumbar 09/20/2011  . Routine general medical examination at a health care facility 09/02/2011  . OSA (obstructive sleep apnea) 01/22/2011  . RIGHT BUNDLE BRANCH BLOCK 03/26/2010  . BRADYCARDIA 02/13/2010  . HYPERCHOLESTEROLEMIA 08/02/2009  . ALS 08/02/2009  . HYPERTENSION, MILD 08/02/2009  . GERD 08/02/2009    Standley Brooking, PTA 08/11/2018, 10:48 AM  [facility] [address] [place], [place], [address] Phone: [phone]   Fax:  [phone]  Name: LADAVION SAVITZ MRN: [id number] Date of Birth: 01/08/1946

## 2018-08-13 ENCOUNTER — Encounter: Payer: Self-pay | Admitting: Physical Therapy

## 2018-08-13 ENCOUNTER — Other Ambulatory Visit: Payer: Self-pay

## 2018-08-13 ENCOUNTER — Ambulatory Visit: Payer: Medicare Other | Admitting: Physical Therapy

## 2018-08-13 DIAGNOSIS — M6281 Muscle weakness (generalized): Secondary | ICD-10-CM | POA: Diagnosis not present

## 2018-08-13 DIAGNOSIS — M25612 Stiffness of left shoulder, not elsewhere classified: Secondary | ICD-10-CM

## 2018-08-13 DIAGNOSIS — M25512 Pain in left shoulder: Secondary | ICD-10-CM | POA: Diagnosis not present

## 2018-08-13 NOTE — Therapy (Signed)
[facility] [place], [place], [address] Phone: [phone]   Fax:  [phone]  Physical Therapy Treatment  Patient Details  Name: Edward Frank MRN: [id number] Date of Birth: April 14, 1946 Referring Provider (PT): Jenetta Loges, PA-C/Kevin Supple MD   Encounter Date: 08/13/2018  PT End of Session - 08/13/18 0953    Visit Number  17    Number of Visits  24    Date for PT Re-Evaluation  08/17/18    Authorization Type  Progress note every 10th visit; KX modifier at 15th visit    PT Start Time  0949    PT Stop Time  1038    PT Time Calculation (min)  49 min    Activity Tolerance  Patient tolerated treatment well    Behavior During Therapy  [facility] for tasks assessed/performed       Past Medical History:  Diagnosis Date  . Arthritis   . Bradycardia   . Fatty liver   . Fracture, tibia, with fibula teenage yrs.    no surgery, wore cast  . GERD (gastroesophageal reflux disease)   . Hyperlipidemia   . Hypertension   . 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 OSA (obstructive sleep apnea) 01/22/2011  . PONV (postoperative nausea and vomiting)   . 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 Right bundle branch block   . Syncope     Past Surgical History:  Procedure Laterality Date  . CHOLECYSTECTOMY N/A 06/13/2016   Procedure: LAPAROSCOPIC CHOLECYSTECTOMY;  Surgeon: Arta Bruce Kinsinger, MD;  [facility];  Service: General;  Laterality: N/A;  . EYE SURGERY     Cataracts bil  . FOOT SURGERY  11-2008   hammer toe and bunion  . HERNIA REPAIR  12-2001   inguinal hernia bilateral  . REVERSE SHOULDER ARTHROPLASTY Left 05/28/2018   Procedure: REVERSE SHOULDER ARTHROPLASTY;  Surgeon: Justice Britain, MD;  [facility];  Service: Orthopedics;  Laterality: Left;  197mn  . SHOULDER ARTHROSCOPY WITH ROTATOR CUFF REPAIR AND SUBACROMIAL DECOMPRESSION Right 06/18/2012   Procedure:  RIGHT SHOULDER ARTHROSCOPY WITH SUBACROMIAL DECOMPRESSION AND DISTAL CLAVICLE RESECTION AND ROTATOR CUFF REPAIR;  Surgeon: KMarin Shutter MD;  Location: [place]  Service: Orthopedics;  Laterality: Right;  . SHOULDER SURGERY  1977   Luxating     There were no vitals filed for this visit.  Subjective Assessment - 08/13/18 0950    Subjective  COVID-19  screen performed prior to patient entering clinic. Reports that his shoulder is doing "fairly well" this morning. Patient reports multiple subluxes over time and multiple traumas.    Pertinent History  ALS, HTN, abdominal aorta aneurysm, left reverse total shoulder replacement 05/28/2018    Limitations  Lifting;House hold activities    Patient Stated Goals  decrease pain, improve function.    Currently in Pain?  Yes    Pain Score  1     Pain Location  Shoulder    Pain Orientation  Left    Pain Descriptors / Indicators  Discomfort;Other (Comment)   Stiffness   Pain Type  Surgical pain    Pain Onset  More than a month ago    Pain Frequency  Intermittent         [facility] PT Assessment - 08/13/18 0001      Assessment   Medical Diagnosis  left reverse total shoulder    Referring Provider (PT)  TJenetta Loges PA-C/Kevin Supple MD  Onset Date/Surgical Date  05/28/18    Hand Dominance  Right    Next MD Visit  08/14/2018    Prior Therapy  no      Precautions   Precautions  Shoulder    Type of Shoulder Precautions  follow Reverse TSA protocol      Restrictions   Weight Bearing Restrictions  Yes      ROM / Strength   AROM / PROM / Strength  AROM;PROM;Strength      AROM   Overall AROM   Deficits    AROM Assessment Site  Shoulder    Right/Left Shoulder  Left    Left Shoulder Flexion  110 Degrees    Left Shoulder Internal Rotation  70 Degrees   L iliac crest in standing   Left Shoulder External Rotation  26 Degrees   painfree range     PROM   PROM Assessment Site  Shoulder    Right/Left Shoulder  Left    Left Shoulder External  Rotation  35 Degrees   pain beginning around 30 deg     Strength   Overall Strength  Deficits    Strength Assessment Site  Shoulder    Right/Left Shoulder  Left    Left Shoulder Flexion  4/5    Left Shoulder Internal Rotation  4/5    Left Shoulder External Rotation  4-/5                   [facility] Adult PT Treatment/Exercise - 08/13/18 0001      Shoulder Exercises: Supine   Protraction  AROM;Left;20 reps    External Rotation  AROM;Left;20 reps    Flexion  AROM;Left;20 reps      Shoulder Exercises: Seated   Flexion  AAROM;Left;20 reps    Other Seated Exercises  LUE upper cuts x20 reps      Shoulder Exercises: Standing   Other Standing Exercises  Wall ladder x15 reps    level 31     Shoulder Exercises: Pulleys   Flexion  5 minutes      Shoulder Exercises: ROM/Strengthening   Ranger  Standing UE ranger into flexion, CW x20 reps      Modalities   Modalities  Vasopneumatic      Vasopneumatic   Number Minutes Vasopneumatic   15 minutes    Vasopnuematic Location   Shoulder    Vasopneumatic Pressure  Low    Vasopneumatic Temperature   34                  PT Long Term Goals - 08/13/18 1018      PT LONG TERM GOAL #1   Title  Patient will be independent with advanced HEP    Time  8    Period  Weeks    Status  Achieved      PT LONG TERM GOAL #2   Title  Patient will demonstrate 130+ degrees of left shoulder flexion AROM to improve ability to perform functional tasks.    Time  8    Period  Weeks    Status  Not Met      PT LONG TERM GOAL #3   Title  Patient will demonstrate 60+ degrees of left shoulder ER AROM to improve ability to don/doff apparel.    Time  8    Period  Weeks    Status  Not Met      PT LONG TERM GOAL #4   Title  Patient will demonstrate  ability functional IR to L4 or higher to improve ability to perform lower body dressing    Time  8    Period  Weeks    Status  Partially Met   L iliac crest 08/13/2018     PT LONG TERM GOAL  #5   Title  Patient will demonstrate 4-/5 or greater of left shoulder MMT to improve stability during functional task.    Time  8    Period  Weeks    Status  Achieved            Plan - 08/13/18 1024    Clinical Impression Statement  Patient presented in clinic with low level L shoulder stiffness. Patient reports weakness when pushing up from a chair currently. Patient has been provided HEPs to assist with ROM and compliant with HEPs. Greatest complaint during PT sessions of muscle fatigue in L shoulder and of discomfort with ER. ROM measurements and MMT measurements provided in today's note. Patient able to achieve most goals except for L shoulder flexion and ER ROM. Normal vasopnuematic response noted following removal of the modality.     Stability/Clinical Decision Making  Stable/Uncomplicated    Rehab Potential  Good    PT Frequency  3x / week    PT Duration  8 weeks    PT Treatment/Interventions  ADLs/Self Care Home Management;Electrical Stimulation;Moist Heat;Cryotherapy;Manual techniques;Passive range of motion;Scar mobilization;Therapeutic exercise;Patient/family education;Therapeutic activities    PT Next Visit Plan  Continue at MD discretion.    PT Home Exercise Plan  HEP- upper cuts, table ER and flexion slides    Consulted and Agree with Plan of Care  Patient       Patient will benefit from skilled therapeutic intervention in order to improve the following deficits and impairments:  Pain, Decreased strength, Decreased range of motion, Decreased activity tolerance, Impaired UE functional use, Postural dysfunction  Visit Diagnosis: Acute pain of left shoulder  Stiffness of left shoulder, not elsewhere classified  Muscle weakness (generalized)     Problem List Patient Active Problem List   Diagnosis Date Noted  . S/P reverse total shoulder arthroplasty, left 05/28/2018  . Prediabetes 11/25/2017  . Pain of left shoulder joint on movement 09/11/2017  . Bilateral leg  edema 06/03/2017  . Abdominal distension 06/03/2017  . Osteoarthritis of left glenohumeral joint 01/21/2017  . Grade I internal hemorrhoids 11/22/2016  . Left foot pain 11/22/2016  . LVH (left ventricular hypertrophy) 08/24/2015  . Counseling regarding end of life decision making 11/17/2014  . DDD (degenerative disc disease), lumbosacral 01/12/2014  . Arthropathy of lumbar facet joint 01/12/2014  . 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 AAA (abdominal aortic aneurysm) without rupture ([place]) 09/22/2012  . Spinal stenosis, lumbar 09/20/2011  . Routine general medical examination at a health care facility 09/02/2011  . OSA (obstructive sleep apnea) 01/22/2011  . RIGHT BUNDLE BRANCH BLOCK 03/26/2010  . BRADYCARDIA 02/13/2010  . HYPERCHOLESTEROLEMIA 08/02/2009  . ALS 08/02/2009  . HYPERTENSION, MILD 08/02/2009  . GERD 08/02/2009    Standley Brooking, PTA 08/13/18 10:43 AM   [facility] [place], [place], [address] Phone: [phone]   Fax:  [phone]  Name: Edward Frank MRN: [id number] Date of Birth: 05/18/1945

## 2018-08-18 ENCOUNTER — Other Ambulatory Visit: Payer: Self-pay

## 2018-08-18 ENCOUNTER — Encounter: Payer: Self-pay | Admitting: Physical Therapy

## 2018-08-18 ENCOUNTER — Ambulatory Visit: Payer: Medicare Other | Admitting: Physical Therapy

## 2018-08-18 DIAGNOSIS — M25512 Pain in left shoulder: Secondary | ICD-10-CM

## 2018-08-18 DIAGNOSIS — M6281 Muscle weakness (generalized): Secondary | ICD-10-CM | POA: Diagnosis not present

## 2018-08-18 DIAGNOSIS — M25612 Stiffness of left shoulder, not elsewhere classified: Secondary | ICD-10-CM | POA: Diagnosis not present

## 2018-08-18 NOTE — Therapy (Signed)
[facility] [place], [place], [address] Phone: [phone]   Fax:  [phone]  Physical Therapy Treatment  Patient Details  Name: Edward Frank MRN: [id number] Date of Birth: 1946-03-30 Referring Provider (PT): Jenetta Loges, PA-C/Kevin Supple MD   Encounter Date: 08/18/2018  PT End of Session - 08/18/18 0948    Visit Number  18    Number of Visits  24    Date for PT Re-Evaluation  08/17/18    Authorization Type  Progress note every 10th visit; KX modifier at 15th visit    PT Start Time  0946    PT Stop Time  1040    PT Time Calculation (min)  54 min    Activity Tolerance  Patient tolerated treatment well    Behavior During Therapy  [facility] for tasks assessed/performed       Past Medical History:  Diagnosis Date  . Arthritis   . Bradycardia   . Fatty liver   . Fracture, tibia, with fibula teenage yrs.    no surgery, wore cast  . GERD (gastroesophageal reflux disease)   . Hyperlipidemia   . Hypertension   . 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 OSA (obstructive sleep apnea) 01/22/2011  . PONV (postoperative nausea and vomiting)   . 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 Right bundle branch block   . Syncope     Past Surgical History:  Procedure Laterality Date  . CHOLECYSTECTOMY N/A 06/13/2016   Procedure: LAPAROSCOPIC CHOLECYSTECTOMY;  Surgeon: Arta Bruce Kinsinger, MD;  Location: [facility];  Service: General;  Laterality: N/A;  . EYE SURGERY     Cataracts bil  . FOOT SURGERY  11-2008   hammer toe and bunion  . HERNIA REPAIR  12-2001   inguinal hernia bilateral  . REVERSE SHOULDER ARTHROPLASTY Left 05/28/2018   Procedure: REVERSE SHOULDER ARTHROPLASTY;  Surgeon: Justice Britain, MD;  Location: [facility];  Service: Orthopedics;  Laterality: Left;  168mn  . SHOULDER ARTHROSCOPY WITH ROTATOR CUFF REPAIR AND SUBACROMIAL DECOMPRESSION Right 06/18/2012   Procedure:  RIGHT SHOULDER ARTHROSCOPY WITH SUBACROMIAL DECOMPRESSION AND DISTAL CLAVICLE RESECTION AND ROTATOR CUFF REPAIR;  Surgeon: KMarin Shutter MD;  [place]  Service: Orthopedics;  Laterality: Right;  . SHOULDER SURGERY  1977   Luxating     There were no vitals filed for this visit.  Subjective Assessment - 08/18/18 0947    Subjective  COVID-19 screening performed prior to patient entering the building. Patient reports ongoing stiffness in left shoulder and stated pain was more in his back than in his shoulder. Patient is 12 weeks on 08/20/2018. Patient reported MD stated no lifting greater than 25 lbs.     Pertinent History  ALS, HTN, abdominal aorta aneurysm, left reverse total shoulder replacement 05/28/2018    Limitations  Lifting;House hold activities    Patient Stated Goals  decrease pain, improve function.    Currently in Pain?  Yes    Pain Score  1     Pain Location  Shoulder    Pain Type  Surgical pain    Pain Onset  More than a month ago    Pain Frequency  Intermittent         [facility] PT Assessment - 08/18/18 0001      Assessment   Medical Diagnosis  left reverse total shoulder    Referring Provider (PT)  TJenetta Loges PA-C/Kevin Supple MD  Onset Date/Surgical Date  05/28/18    Hand Dominance  Right    Next MD Visit  July 2020    Prior Therapy  no      Precautions   Precautions  Shoulder    Type of Shoulder Precautions  follow Reverse TSA protocol      Restrictions   Weight Bearing Restrictions  Yes                   [facility] Adult PT Treatment/Exercise - 08/18/18 0001      Shoulder Exercises: Supine   Protraction  AROM;Left;20 reps    External Rotation  AROM;Left;20 reps    Internal Rotation  AROM;Left;20 reps    Flexion  AROM;Left;20 reps    ABduction  AROM;Left;20 reps    Diagonals  AROM;Left;20 reps      Shoulder Exercises: Standing   Other Standing Exercises  Wall ladder x15 reps to 31      Shoulder Exercises: Pulleys   Flexion  5  minutes      Shoulder Exercises: ROM/Strengthening   Ranger  Standing UE ranger into flexion, CW, CCW x3 minutes each      Modalities   Modalities  Vasopneumatic      Vasopneumatic   Number Minutes Vasopneumatic   15 minutes    Vasopnuematic Location   Shoulder    Vasopneumatic Pressure  Low    Vasopneumatic Temperature   34                  PT Long Term Goals - 08/13/18 1018      PT LONG TERM GOAL #1   Title  Patient will be independent with advanced HEP    Time  8    Period  Weeks    Status  Achieved      PT LONG TERM GOAL #2   Title  Patient will demonstrate 130+ degrees of left shoulder flexion AROM to improve ability to perform functional tasks.    Time  8    Period  Weeks    Status  Not Met      PT LONG TERM GOAL #3   Title  Patient will demonstrate 60+ degrees of left shoulder ER AROM to improve ability to don/doff apparel.    Time  8    Period  Weeks    Status  Not Met      PT LONG TERM GOAL #4   Title  Patient will demonstrate ability functional IR to L4 or higher to improve ability to perform lower body dressing    Time  8    Period  Weeks    Status  Partially Met   L iliac crest 08/13/2018     PT LONG TERM GOAL #5   Title  Patient will demonstrate 4-/5 or greater of left shoulder MMT to improve stability during functional task.    Time  8    Period  Weeks    Status  Achieved            Plan - 08/18/18 1031    Clinical Impression Statement  Patient was able to tolerate treatment fairly well but still reports ongoing fatigue. Patient noted with left shoulder compensatory movements especially with fatigue. Patient was able to tolerate new supine AROM with improved form after tactile cuing. Normal response to modalities upon removal.    Stability/Clinical Decision Making  Stable/Uncomplicated    Clinical Decision Making  Moderate    Rehab Potential  Good  PT Frequency  3x / week    PT Duration  8 weeks    PT Treatment/Interventions   ADLs/Self Care Home Management;Electrical Stimulation;Moist Heat;Cryotherapy;Manual techniques;Passive range of motion;Scar mobilization;Therapeutic exercise;Patient/family education;Therapeutic activities    PT Next Visit Plan  Continue at MD discretion.    PT Home Exercise Plan  HEP- upper cuts, table ER and flexion slides    Consulted and Agree with Plan of Care  Patient       Patient will benefit from skilled therapeutic intervention in order to improve the following deficits and impairments:  Pain, Decreased strength, Decreased range of motion, Decreased activity tolerance, Impaired UE functional use, Postural dysfunction  Visit Diagnosis: Acute pain of left shoulder  Stiffness of left shoulder, not elsewhere classified  Muscle weakness (generalized)     Problem List Patient Active Problem List   Diagnosis Date Noted  . S/P reverse total shoulder arthroplasty, left 05/28/2018  . Prediabetes 11/25/2017  . Pain of left shoulder joint on movement 09/11/2017  . Bilateral leg edema 06/03/2017  . Abdominal distension 06/03/2017  . Osteoarthritis of left glenohumeral joint 01/21/2017  . Grade I internal hemorrhoids 11/22/2016  . Left foot pain 11/22/2016  . LVH (left ventricular hypertrophy) 08/24/2015  . Counseling regarding end of life decision making 11/17/2014  . DDD (degenerative disc disease), lumbosacral 01/12/2014  . Arthropathy of lumbar facet joint 01/12/2014  . Upper motor neuron disease ([place]) 01/12/2014  . AAA (abdominal aortic aneurysm) without rupture ([place]) 09/22/2012  . Spinal stenosis, lumbar 09/20/2011  . Routine general medical examination at a health care facility 09/02/2011  . OSA (obstructive sleep apnea) 01/22/2011  . RIGHT BUNDLE BRANCH BLOCK 03/26/2010  . BRADYCARDIA 02/13/2010  . HYPERCHOLESTEROLEMIA 08/02/2009  . ALS 08/02/2009  . HYPERTENSION, MILD 08/02/2009  . GERD 08/02/2009   Gabriela Eves, PT, DPT 08/18/2018, 11:12 AM  [facility] [place], [place], [address] Phone: [phone]   Fax:  [phone]  Name: Edward Frank MRN: [id number] Date of Birth: 05/05/1945

## 2018-08-20 ENCOUNTER — Other Ambulatory Visit: Payer: Self-pay

## 2018-08-20 ENCOUNTER — Ambulatory Visit: Payer: Medicare Other | Admitting: Physical Therapy

## 2018-08-20 DIAGNOSIS — M25512 Pain in left shoulder: Secondary | ICD-10-CM | POA: Diagnosis not present

## 2018-08-20 DIAGNOSIS — M25612 Stiffness of left shoulder, not elsewhere classified: Secondary | ICD-10-CM

## 2018-08-20 DIAGNOSIS — M6281 Muscle weakness (generalized): Secondary | ICD-10-CM | POA: Diagnosis not present

## 2018-08-20 NOTE — Therapy (Signed)
[facility] [place], [place], [address] Phone: [phone]   Fax:  [phone]  Physical Therapy Treatment  Patient Details  Name: Edward Frank MRN: [id number] Date of Birth: 05/19/1945 Referring Provider (PT): Jenetta Loges, PA-C/Kevin Supple MD   Encounter Date: 08/20/2018  PT End of Session - 08/20/18 1009    Visit Number  19    Number of Visits  28    Date for PT Re-Evaluation  10/02/18    Authorization Type  Progress note every 10th visit; KX modifier at 15th visit    PT Start Time  0946    PT Stop Time  1046    PT Time Calculation (min)  60 min    Activity Tolerance  Patient tolerated treatment well    Behavior During Therapy  [facility] for tasks assessed/performed       Past Medical History:  Diagnosis Date  . Arthritis   . Bradycardia   . Fatty liver   . Fracture, tibia, with fibula teenage yrs.    no surgery, wore cast  . GERD (gastroesophageal reflux disease)   . Hyperlipidemia   . Hypertension   . 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 OSA (obstructive sleep apnea) 01/22/2011  . PONV (postoperative nausea and vomiting)   . 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 Right bundle branch block   . Syncope     Past Surgical History:  Procedure Laterality Date  . CHOLECYSTECTOMY N/A 06/13/2016   Procedure: LAPAROSCOPIC CHOLECYSTECTOMY;  Surgeon: Arta Bruce Kinsinger, MD;  [facility];  Service: General;  Laterality: N/A;  . EYE SURGERY     Cataracts bil  . FOOT SURGERY  11-2008   hammer toe and bunion  . HERNIA REPAIR  12-2001   inguinal hernia bilateral  . REVERSE SHOULDER ARTHROPLASTY Left 05/28/2018   Procedure: REVERSE SHOULDER ARTHROPLASTY;  Surgeon: Justice Britain, MD;  [facility];  Service: Orthopedics;  Laterality: Left;  155mn  . SHOULDER ARTHROSCOPY WITH ROTATOR CUFF REPAIR AND SUBACROMIAL DECOMPRESSION Right 06/18/2012   Procedure:  RIGHT SHOULDER ARTHROSCOPY WITH SUBACROMIAL DECOMPRESSION AND DISTAL CLAVICLE RESECTION AND ROTATOR CUFF REPAIR;  Surgeon: KMarin Shutter MD;  [place] [facility]  Service: Orthopedics;  Laterality: Right;  . SHOULDER SURGERY  1977   Luxating     There were no vitals filed for this visit.  Subjective Assessment - 08/20/18 0954    Subjective  COVID-19 screening performed prior to patient entering the building. Patient reported he mowed the lawn for about 2 hours yesterday so he's more sore today.    Pertinent History  ALS, HTN, abdominal aorta aneurysm, left reverse total shoulder replacement 05/28/2018    Limitations  Lifting;House hold activities    Patient Stated Goals  decrease pain, improve function.    Currently in Pain?  Yes    Pain Score  4     Pain Location  Shoulder    Pain Orientation  Left    Pain Descriptors / Indicators  Sore    Pain Onset  More than a month ago    Pain Frequency  Intermittent         [facility] PT Assessment - 08/20/18 0001      Assessment   Medical Diagnosis  left reverse total shoulder    Referring Provider (PT)  TJenetta Loges PA-C/Kevin Supple MD    Onset Date/Surgical Date  05/28/18    Hand Dominance  Right    Next MD Visit  July 2020    Prior Therapy  no      Precautions   Precautions  Shoulder    Type of Shoulder Precautions  follow Reverse TSA protocol                   [facility] Adult PT Treatment/Exercise - 08/20/18 0001      Shoulder Exercises: Supine   Protraction  AROM;Left;20 reps      Shoulder Exercises: Seated   Flexion  AROM;Left;20 reps      Shoulder Exercises: Standing   Flexion  AROM;Left;20 reps    Flexion Limitations  to bottom and middle shelf    Other Standing Exercises  Wall ladder x15 reps to 31      Shoulder Exercises: Pulleys   Flexion  5 minutes      Shoulder Exercises: ROM/Strengthening   UBE (Upper Arm Bike)  6 mins 3 fwd, 3 bwd AAROM      Shoulder Exercises: Isometric Strengthening   Flexion   Supine;5X5"    Extension  Supine;5X5"    External Rotation  Supine;5X5"    Internal Rotation  Supine;5X5"    ABduction  Supine;5X5"    ADduction  Supine;5X5"      Modalities   Modalities  Vasopneumatic      Vasopneumatic   Number Minutes Vasopneumatic   15 minutes    Vasopnuematic Location   Shoulder    Vasopneumatic Pressure  Low    Vasopneumatic Temperature   34                  PT Long Term Goals - 08/13/18 1018      PT LONG TERM GOAL #1   Title  Patient will be independent with advanced HEP    Time  8    Period  Weeks    Status  Achieved      PT LONG TERM GOAL #2   Title  Patient will demonstrate 130+ degrees of left shoulder flexion AROM to improve ability to perform functional tasks.    Time  8    Period  Weeks    Status  Not Met      PT LONG TERM GOAL #3   Title  Patient will demonstrate 60+ degrees of left shoulder ER AROM to improve ability to don/doff apparel.    Time  8    Period  Weeks    Status  Not Met      PT LONG TERM GOAL #4   Title  Patient will demonstrate ability functional IR to L4 or higher to improve ability to perform lower body dressing    Time  8    Period  Weeks    Status  Partially Met   L iliac crest 08/13/2018     PT LONG TERM GOAL #5   Title  Patient will demonstrate 4-/5 or greater of left shoulder MMT to improve stability during functional task.    Time  8    Period  Weeks    Status  Achieved            Plan - 08/20/18 1057    Clinical Impression Statement  Patient was able to tolerate treatment fairly well and was able to tolerate progression of isometrics. Patient discussed continuing at 2x per week and decreasing to 1x per week with emphasis HEP; patient reported understanding. No adverse affects noted upon removal of modalities. Recertification sent to request more visits  and for date extension.       Patient will benefit from skilled therapeutic intervention in order to improve the following deficits and  impairments:     Visit Diagnosis: Acute pain of left shoulder - Plan: PT plan of care cert/re-cert  Stiffness of left shoulder, not elsewhere classified - Plan: PT plan of care cert/re-cert  Muscle weakness (generalized) - Plan: PT plan of care cert/re-cert     Problem List Patient Active Problem List   Diagnosis Date Noted  . S/P reverse total shoulder arthroplasty, left 05/28/2018  . Prediabetes 11/25/2017  . Pain of left shoulder joint on movement 09/11/2017  . Bilateral leg edema 06/03/2017  . Abdominal distension 06/03/2017  . Osteoarthritis of left glenohumeral joint 01/21/2017  . Grade I internal hemorrhoids 11/22/2016  . Left foot pain 11/22/2016  . LVH (left ventricular hypertrophy) 08/24/2015  . Counseling regarding end of life decision making 11/17/2014  . DDD (degenerative disc disease), lumbosacral 01/12/2014  . Arthropathy of lumbar facet joint 01/12/2014  . Upper motor neuron disease ([place]) 01/12/2014  . AAA (abdominal aortic aneurysm) without rupture ([place]) 09/22/2012  . Spinal stenosis, lumbar 09/20/2011  . Routine general medical examination at a health care facility 09/02/2011  . OSA (obstructive sleep apnea) 01/22/2011  . RIGHT BUNDLE BRANCH BLOCK 03/26/2010  . BRADYCARDIA 02/13/2010  . HYPERCHOLESTEROLEMIA 08/02/2009  . ALS 08/02/2009  . HYPERTENSION, MILD 08/02/2009  . GERD 08/02/2009    Gabriela Eves, PT, DPT 08/20/2018, 11:19 AM  [facility] [place], [place], [address] Phone: [phone]   Fax:  [phone]  Name: LAIKEN NOHR MRN: [id number] Date of Birth: [birth year]

## 2018-08-25 ENCOUNTER — Ambulatory Visit: Payer: Medicare Other | Attending: Orthopedic Surgery | Admitting: Physical Therapy

## 2018-08-25 ENCOUNTER — Other Ambulatory Visit: Payer: Self-pay

## 2018-08-25 ENCOUNTER — Encounter: Payer: Self-pay | Admitting: Physical Therapy

## 2018-08-25 DIAGNOSIS — M25512 Pain in left shoulder: Secondary | ICD-10-CM | POA: Insufficient documentation

## 2018-08-25 DIAGNOSIS — M25612 Stiffness of left shoulder, not elsewhere classified: Secondary | ICD-10-CM | POA: Diagnosis not present

## 2018-08-25 DIAGNOSIS — M6281 Muscle weakness (generalized): Secondary | ICD-10-CM | POA: Diagnosis not present

## 2018-08-25 NOTE — Therapy (Signed)
[facility] [place], [place], [address] Phone: [phone]   Fax:  [phone]  Physical Therapy Treatment  Patient Details  Name: Edward Frank MRN: [id number] Date of Birth: 03-May-1945 Referring Provider (PT): Jenetta Loges, PA-C/Kevin Supple MD   Encounter Date: 08/25/2018  PT End of Session - 08/25/18 3532    Visit Number  20    Number of Visits  28    Date for PT Re-Evaluation  10/02/18    Authorization Type  Progress note every 10th visit; KX modifier at 15th visit    PT Start Time  0950    PT Stop Time  1041    PT Time Calculation (min)  51 min    Activity Tolerance  Patient tolerated treatment well    Behavior During Therapy  [facility] for tasks assessed/performed       Past Medical History:  Diagnosis Date  . Arthritis   . Bradycardia   . Fatty liver   . Fracture, tibia, with fibula teenage yrs.    no surgery, wore cast  . GERD (gastroesophageal reflux disease)   . Hyperlipidemia   . Hypertension   . 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 OSA (obstructive sleep apnea) 01/22/2011  . PONV (postoperative nausea and vomiting)   . 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 Right bundle branch block   . Syncope     Past Surgical History:  Procedure Laterality Date  . CHOLECYSTECTOMY N/A 06/13/2016   Procedure: LAPAROSCOPIC CHOLECYSTECTOMY;  Surgeon: Arta Bruce Kinsinger, MD;  [facility];  Service: General;  Laterality: N/A;  . EYE SURGERY     Cataracts bil  . FOOT SURGERY  11-2008   hammer toe and bunion  . HERNIA REPAIR  12-2001   inguinal hernia bilateral  . REVERSE SHOULDER ARTHROPLASTY Left 05/28/2018   Procedure: REVERSE SHOULDER ARTHROPLASTY;  Surgeon: Justice Britain, MD;  [facility];  Service: Orthopedics;  Laterality: Left;  118mn  . SHOULDER ARTHROSCOPY WITH ROTATOR CUFF REPAIR AND SUBACROMIAL DECOMPRESSION Right 06/18/2012   Procedure: RIGHT  SHOULDER ARTHROSCOPY WITH SUBACROMIAL DECOMPRESSION AND DISTAL CLAVICLE RESECTION AND ROTATOR CUFF REPAIR;  Surgeon: KMarin Shutter MD;  [place]  Service: Orthopedics;  Laterality: Right;  . SHOULDER SURGERY  1977   Luxating     There were no vitals filed for this visit.  Subjective Assessment - 08/25/18 0951    Subjective  COVID-19 screening performed prior to patient entering the building. Reporting only stiffness and tightness today.    Pertinent History  ALS, HTN, abdominal aorta aneurysm, left reverse total shoulder replacement 05/28/2018    Limitations  Lifting;House hold activities    Patient Stated Goals  decrease pain, improve function.    Currently in Pain?  No/denies         [facility]      Assessment   Medical Diagnosis  left reverse total shoulder    Referring Provider (PT)  TJenetta Loges PA-C/Kevin Supple MD    Onset Date/Surgical Date  05/28/18    Hand Dominance  Right    Next MD Visit  July 2020    Prior Therapy  no      Precautions   Precautions  Shoulder    Type of Shoulder Precautions  follow Reverse TSA protocol                   [facility]  Adult PT Treatment/Exercise - 08/25/18 0001      Shoulder Exercises: Seated   External Rotation  AROM;Both;20 reps    Flexion  AROM;Both;20 reps      Shoulder Exercises: Standing   Flexion  Strengthening;Left;20 reps;Weights    Shoulder Flexion Weight (lbs)  1    Flexion Limitations  to bottom and middle shelf    Other Standing Exercises  Wall ladder x15 reps to 31      Shoulder Exercises: Pulleys   Flexion  5 minutes      Shoulder Exercises: ROM/Strengthening   UBE (Upper Arm Bike)  60 RPM x8 min   forward/backward     Shoulder Exercises: Isometric Strengthening   Flexion  5X5"    Extension  5X5"    External Rotation  5X5"    Internal Rotation  5X5"    ABduction  5X5"      Modalities   Modalities  Vasopneumatic      Vasopneumatic   Number Minutes  Vasopneumatic   15 minutes    Vasopnuematic Location   Shoulder    Vasopneumatic Pressure  Low    Vasopneumatic Temperature   34                  PT Long Term Goals - 08/13/18 1018      PT LONG TERM GOAL #1   Title  Patient will be independent with advanced HEP    Time  8    Period  Weeks    Status  Achieved      PT LONG TERM GOAL #2   Title  Patient will demonstrate 130+ degrees of left shoulder flexion AROM to improve ability to perform functional tasks.    Time  8    Period  Weeks    Status  Not Met      PT LONG TERM GOAL #3   Title  Patient will demonstrate 60+ degrees of left shoulder ER AROM to improve ability to don/doff apparel.    Time  8    Period  Weeks    Status  Not Met      PT LONG TERM GOAL #4   Title  Patient will demonstrate ability functional IR to L4 or higher to improve ability to perform lower body dressing    Time  8    Period  Weeks    Status  Partially Met   L iliac crest 08/13/2018     PT LONG TERM GOAL #5   Title  Patient will demonstrate 4-/5 or greater of left shoulder MMT to improve stability during functional task.    Time  8    Period  Weeks    Status  Achieved            Plan - 08/25/18 1033    Clinical Impression Statement  Patient presented in clinic with only reports of L shoulder tightness/soreness. Patient guided through more functional strengthening and  continued as well. No complaints with exercises throughout the treatment. Normal vasopnuematic response noted following removal of the modality.    Stability/Clinical Decision Making  Stable/Uncomplicated    Rehab Potential  Good    PT Frequency  3x / week    PT Duration  8 weeks    PT Treatment/Interventions  ADLs/Self Care Home Management;Electrical Stimulation;Moist Heat;Cryotherapy;Manual techniques;Passive range of motion;Scar mobilization;Therapeutic exercise;Patient/family education;Therapeutic activities    PT Next Visit Plan  Continue at MD discretion.     PT Home Exercise Plan  HEP-  upper cuts, table ER and flexion slides    Consulted and Agree with Plan of Care  Patient       Patient will benefit from skilled therapeutic intervention in order to improve the following deficits and impairments:  Pain, Decreased strength, Decreased range of motion, Decreased activity tolerance, Impaired UE functional use, Postural dysfunction  Visit Diagnosis: Acute pain of left shoulder  Stiffness of left shoulder, not elsewhere classified  Muscle weakness (generalized)     Problem List Patient Active Problem List   Diagnosis Date Noted  . S/P reverse total shoulder arthroplasty, left 05/28/2018  . Prediabetes 11/25/2017  . Pain of left shoulder joint on movement 09/11/2017  . Bilateral leg edema 06/03/2017  . Abdominal distension 06/03/2017  . Osteoarthritis of left glenohumeral joint 01/21/2017  . Grade I internal hemorrhoids 11/22/2016  . Left foot pain 11/22/2016  . LVH (left ventricular hypertrophy) 08/24/2015  . Counseling regarding end of life decision making 11/17/2014  . DDD (degenerative disc disease), lumbosacral 01/12/2014  . Arthropathy of lumbar facet joint 01/12/2014  . Upper motor neuron disease ([place]) 01/12/2014  . AAA (abdominal aortic aneurysm) without rupture ([place]) 09/22/2012  . Spinal stenosis, lumbar 09/20/2011  . Routine general medical examination at a health care facility 09/02/2011  . OSA (obstructive sleep apnea) 01/22/2011  . RIGHT BUNDLE BRANCH BLOCK 03/26/2010  . BRADYCARDIA 02/13/2010  . HYPERCHOLESTEROLEMIA 08/02/2009  . ALS 08/02/2009  . HYPERTENSION, MILD 08/02/2009  . GERD 08/02/2009    Standley Brooking, PTA 08/25/2018, 11:03 AM  [facility] [place], [place], [address] Phone: [phone]   Fax:  [phone]  Name: NICHOALS HEYDE MRN: [id number] Date of Birth: September 25, 1945

## 2018-08-27 ENCOUNTER — Ambulatory Visit: Payer: Medicare Other | Admitting: Physical Therapy

## 2018-08-27 ENCOUNTER — Encounter: Payer: Self-pay | Admitting: Physical Therapy

## 2018-08-27 ENCOUNTER — Other Ambulatory Visit: Payer: Self-pay

## 2018-08-27 DIAGNOSIS — M25612 Stiffness of left shoulder, not elsewhere classified: Secondary | ICD-10-CM | POA: Diagnosis not present

## 2018-08-27 DIAGNOSIS — M25512 Pain in left shoulder: Secondary | ICD-10-CM

## 2018-08-27 DIAGNOSIS — M6281 Muscle weakness (generalized): Secondary | ICD-10-CM | POA: Diagnosis not present

## 2018-08-27 NOTE — Therapy (Signed)
[facility] [place], [place], [address] Phone: [phone]   Fax:  [phone]  Physical Therapy Treatment  Patient Details  Name: Edward Frank MRN: [id number] Date of Birth: 1945/11/05 Referring Provider (PT): Jenetta Loges, PA-C/Kevin Supple MD   Encounter Date: 08/27/2018  PT End of Session - 08/27/18 1037    Visit Number  21    Number of Visits  28    Date for PT Re-Evaluation  10/02/18    Authorization Type  Progress note every 10th visit; KX modifier at 15th visit    PT Start Time  0944    PT Stop Time  1042    PT Time Calculation (min)  58 min    Activity Tolerance  Patient tolerated treatment well    Behavior During Therapy  [facility] for tasks assessed/performed       Past Medical History:  Diagnosis Date  . Arthritis   . Bradycardia   . Fatty liver   . Fracture, tibia, with fibula teenage yrs.    no surgery, wore cast  . GERD (gastroesophageal reflux disease)   . Hyperlipidemia   . Hypertension   . 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 OSA (obstructive sleep apnea) 01/22/2011  . PONV (postoperative nausea and vomiting)   . 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 Right bundle branch block   . Syncope     Past Surgical History:  Procedure Laterality Date  . CHOLECYSTECTOMY N/A 06/13/2016   Procedure: LAPAROSCOPIC CHOLECYSTECTOMY;  Surgeon: Arta Bruce Kinsinger, MD;  Location: [facility];  Service: General;  Laterality: N/A;  . EYE SURGERY     Cataracts bil  . FOOT SURGERY  11-2008   hammer toe and bunion  . HERNIA REPAIR  12-2001   inguinal hernia bilateral  . REVERSE SHOULDER ARTHROPLASTY Left 05/28/2018   Procedure: REVERSE SHOULDER ARTHROPLASTY;  Surgeon: Justice Britain, MD;  Location: [facility];  Service: Orthopedics;  Laterality: Left;  147mn  . SHOULDER ARTHROSCOPY WITH ROTATOR CUFF REPAIR AND SUBACROMIAL DECOMPRESSION Right 06/18/2012   Procedure: RIGHT  SHOULDER ARTHROSCOPY WITH SUBACROMIAL DECOMPRESSION AND DISTAL CLAVICLE RESECTION AND ROTATOR CUFF REPAIR;  Surgeon: KMarin Shutter MD;  [place] [facility]  Service: Orthopedics;  Laterality: Right;  . SHOULDER SURGERY  1977   Luxating     There were no vitals filed for this visit.  Subjective Assessment - 08/27/18 1030    Subjective  COVID-19 screening performed prior to patient entering the building. Patient reported little pain today.    Pertinent History  ALS, HTN, abdominal aorta aneurysm, left reverse total shoulder replacement 05/28/2018    Limitations  Lifting;House hold activities    Patient Stated Goals  decrease pain, improve function.    Currently in Pain?  Yes    Pain Score  1     Pain Location  Shoulder    Pain Orientation  Left    Pain Descriptors / Indicators  Sore    Pain Type  Surgical pain    Pain Onset  More than a month ago    Pain Frequency  Intermittent         [facility] PT Assessment - 08/27/18 0001      Assessment   Medical Diagnosis  left reverse total shoulder    Referring Provider (PT)  TJenetta Loges PA-C/Kevin Supple MD    Onset Date/Surgical Date  05/28/18    Hand Dominance  Right  Next MD Visit  July 2020    Prior Therapy  no                   [facility] Adult PT Treatment/Exercise - 08/27/18 0001      Shoulder Exercises: Supine   Horizontal ABduction  AROM;15 reps    Diagonals  AROM;Left;15 reps      Shoulder Exercises: Standing   Flexion  Strengthening;Left;20 reps;Weights    Shoulder Flexion Weight (lbs)  1    Flexion Limitations  to bottom and middle shelf    ABduction  AROM;Left;20 reps    Other Standing Exercises  Wall ladder x15 reps to 31      Shoulder Exercises: Pulleys   Flexion  5 minutes      Shoulder Exercises: ROM/Strengthening   UBE (Upper Arm Bike)  60 RPM x8 min      Shoulder Exercises: Isometric Strengthening   Flexion  Supine;5X5"    Extension  Supine;5X5"    External Rotation  Supine;5X5"    Internal  Rotation  Supine;5X5"    ABduction  Supine;5X5"    ADduction  Supine;5X5"      Modalities   Modalities  Vasopneumatic      Vasopneumatic   Number Minutes Vasopneumatic   15 minutes    Vasopnuematic Location   Shoulder    Vasopneumatic Pressure  Low    Vasopneumatic Temperature   34                  PT Long Term Goals - 08/13/18 1018      PT LONG TERM GOAL #1   Title  Patient will be independent with advanced HEP    Time  8    Period  Weeks    Status  Achieved      PT LONG TERM GOAL #2   Title  Patient will demonstrate 130+ degrees of left shoulder flexion AROM to improve ability to perform functional tasks.    Time  8    Period  Weeks    Status  Not Met      PT LONG TERM GOAL #3   Title  Patient will demonstrate 60+ degrees of left shoulder ER AROM to improve ability to don/doff apparel.    Time  8    Period  Weeks    Status  Not Met      PT LONG TERM GOAL #4   Title  Patient will demonstrate ability functional IR to L4 or higher to improve ability to perform lower body dressing    Time  8    Period  Weeks    Status  Partially Met   L iliac crest 08/13/2018     PT LONG TERM GOAL #5   Title  Patient will demonstrate 4-/5 or greater of left shoulder MMT to improve stability during functional task.    Time  8    Period  Weeks    Status  Achieved            Plan - 08/27/18 1052    Clinical Impression Statement  Patient was able to tolerate treatment well despite ongoing soreness. Patient required intermittent verbal and tactile cuing for proper form. Patient noted with good form with supine isometrics. Normal response to modalities upon removal.     Stability/Clinical Decision Making  Stable/Uncomplicated    Clinical Decision Making  Moderate    Rehab Potential  Good    PT Frequency  3x / week  PT Duration  8 weeks    PT Treatment/Interventions  ADLs/Self Care Home Management;Electrical Stimulation;Moist Heat;Cryotherapy;Manual techniques;Passive  range of motion;Scar mobilization;Therapeutic exercise;Patient/family education;Therapeutic activities    PT Next Visit Plan  Continue at MD discretion.    Consulted and Agree with Plan of Care  Patient       Patient will benefit from skilled therapeutic intervention in order to improve the following deficits and impairments:  Pain, Decreased strength, Decreased range of motion, Decreased activity tolerance, Impaired UE functional use, Postural dysfunction  Visit Diagnosis: Acute pain of left shoulder  Stiffness of left shoulder, not elsewhere classified  Muscle weakness (generalized)     Problem List Patient Active Problem List   Diagnosis Date Noted  . S/P reverse total shoulder arthroplasty, left 05/28/2018  . Prediabetes 11/25/2017  . Pain of left shoulder joint on movement 09/11/2017  . Bilateral leg edema 06/03/2017  . Abdominal distension 06/03/2017  . Osteoarthritis of left glenohumeral joint 01/21/2017  . Grade I internal hemorrhoids 11/22/2016  . Left foot pain 11/22/2016  . LVH (left ventricular hypertrophy) 08/24/2015  . Counseling regarding end of life decision making 11/17/2014  . DDD (degenerative disc disease), lumbosacral 01/12/2014  . Arthropathy of lumbar facet joint 01/12/2014  . Upper motor neuron disease ([place]) 01/12/2014  . AAA (abdominal aortic aneurysm) without rupture ([place]) 09/22/2012  . Spinal stenosis, lumbar 09/20/2011  . Routine general medical examination at a health care facility 09/02/2011  . OSA (obstructive sleep apnea) 01/22/2011  . RIGHT BUNDLE BRANCH BLOCK 03/26/2010  . BRADYCARDIA 02/13/2010  . HYPERCHOLESTEROLEMIA 08/02/2009  . ALS 08/02/2009  . HYPERTENSION, MILD 08/02/2009  . GERD 08/02/2009   Gabriela Eves, PT, DPT 08/27/2018, 11:17 AM  [facility] [place], [place], [address] Phone: [phone]   Fax:  [phone]  Name: Edward Frank MRN: [id number] Date of  Birth: December 26, 1945

## 2018-08-31 IMAGING — CT CT ABD-PELV W/ CM
2 of 5 series · 16 of 46 positions shown, 18 images · IV contrast (ISOVUE 300)
Comparison: Aortic ultrasound 07/17/2016

CLINICAL DATA: Abdominal swelling, distention, ascites suspected

EXAM:
CT ABDOMEN AND PELVIS WITH CONTRAST
TECHNIQUE: Multidetector CT imaging of the abdomen and pelvis was performed
using the standard protocol following bolus administration of
intravenous contrast.
CONTRAST:  100 mL [id number] IV

[Series 2: abd/pel w · axial · 0.93mm/px · z∈[+918,+1328]mm · 13 of 94 slices shown, 15 images]
[im 6/94  soft-tissue]
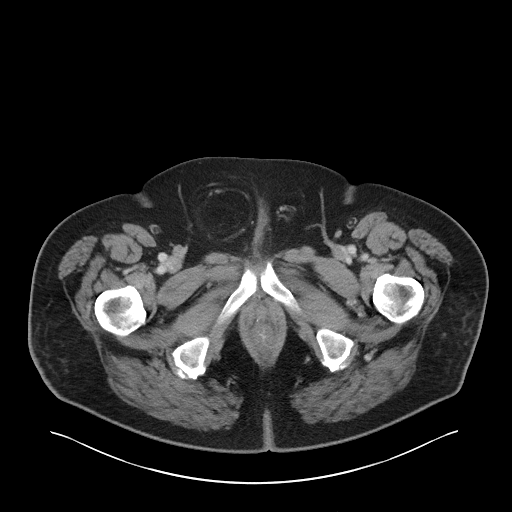
[im 6/94  bone]
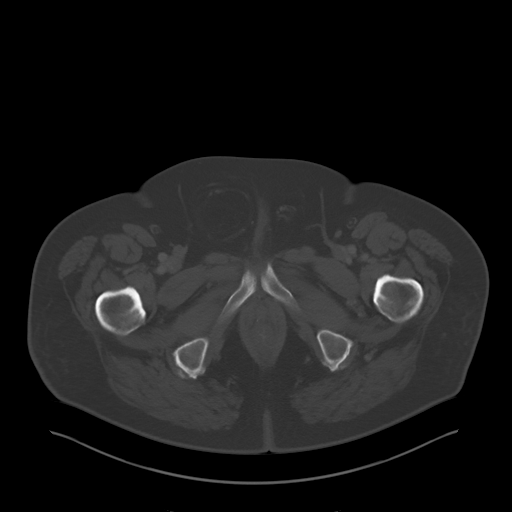
[im 11/94  soft-tissue]
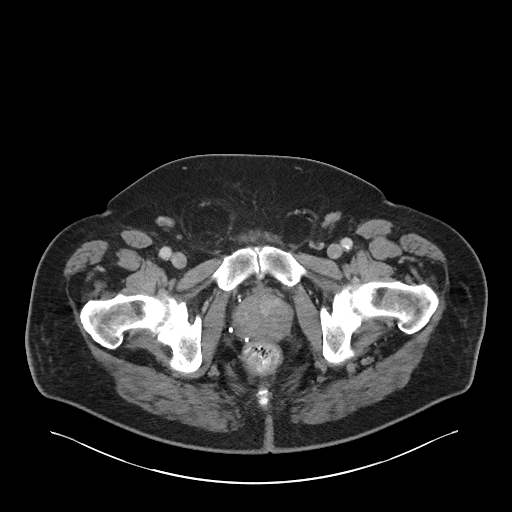
[im 21/94  soft-tissue]
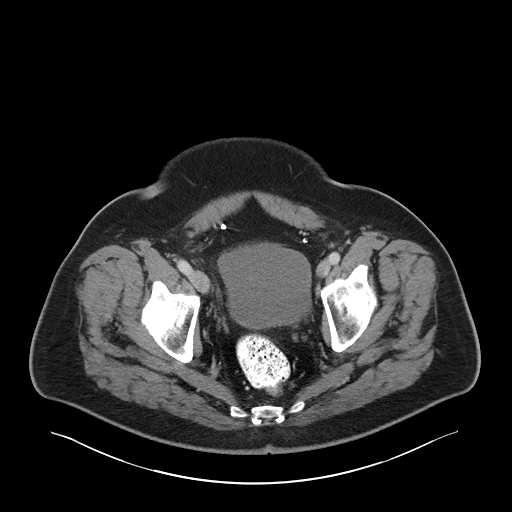
[im 26/94  soft-tissue]
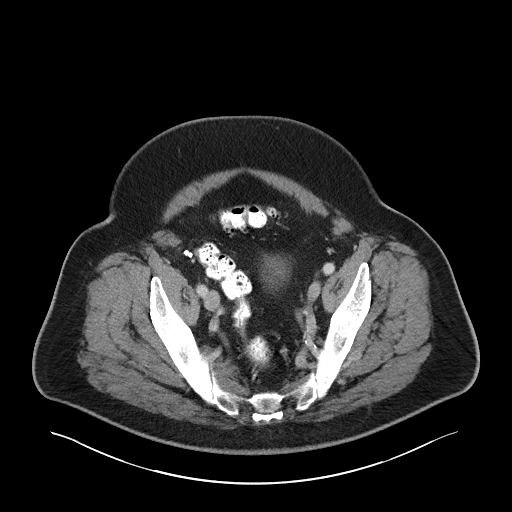
[im 32/94  soft-tissue]
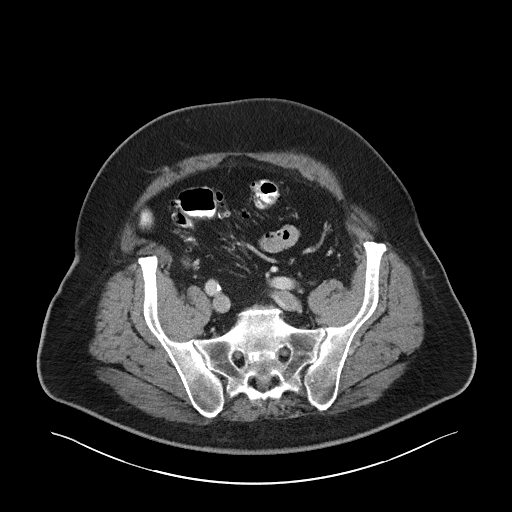
[im 42/94  soft-tissue]
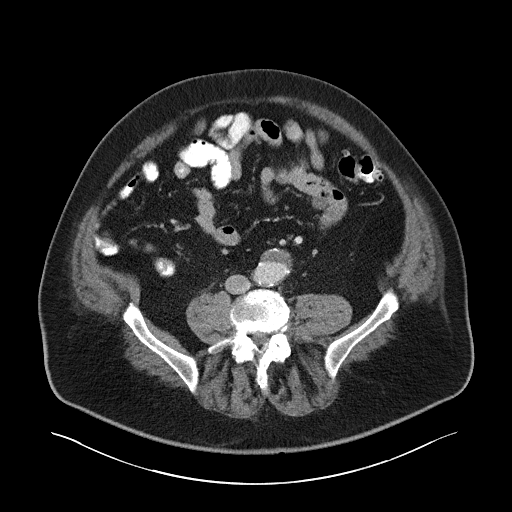
[im 47/94  soft-tissue]
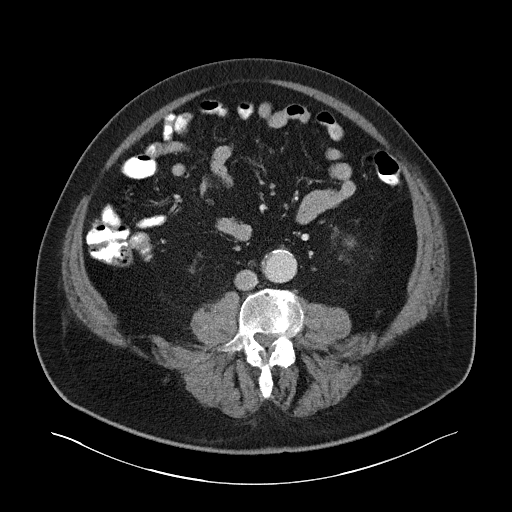
[im 52/94  soft-tissue]
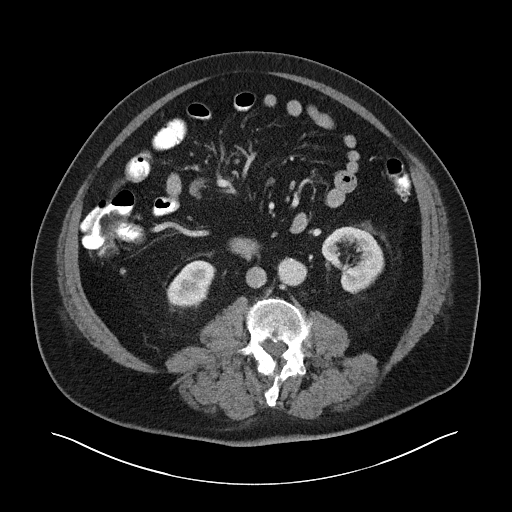
[im 63/94  soft-tissue]
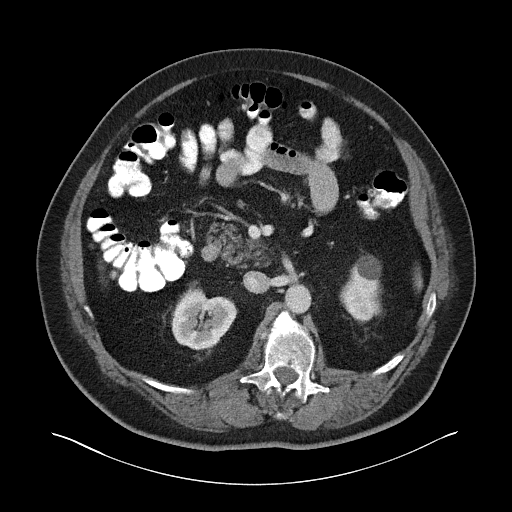
[im 63/94  bone]
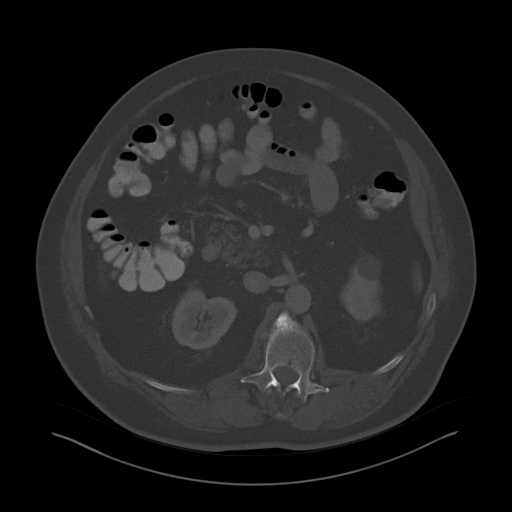
[im 68/94  soft-tissue]
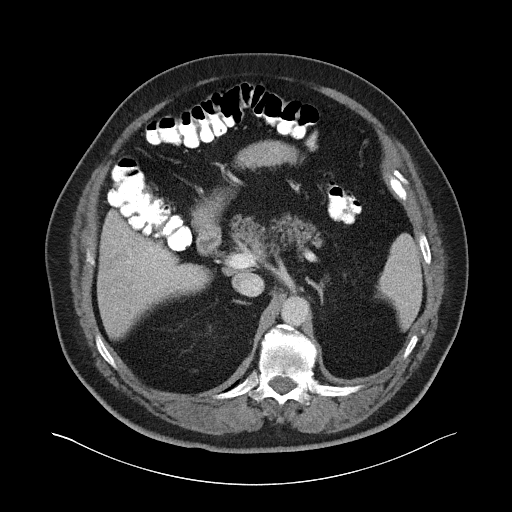
[im 73/94  soft-tissue]
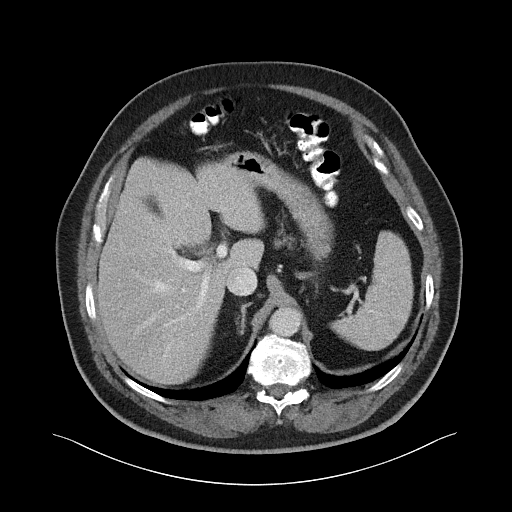
[im 83/94  soft-tissue]
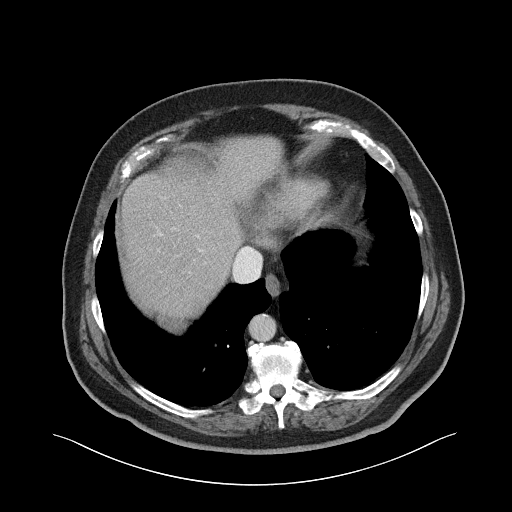
[im 88/94  soft-tissue]
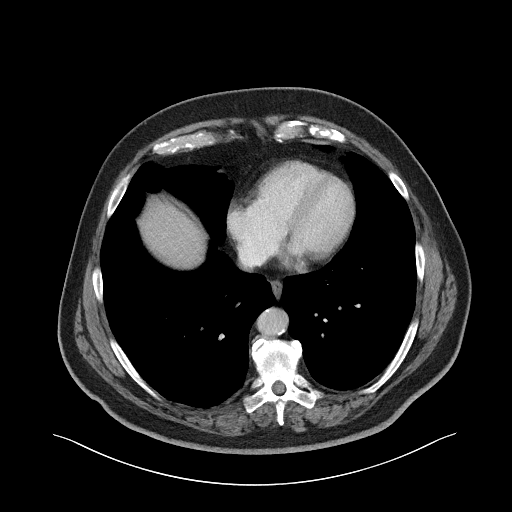

[Series 5: abd/pel w st · coronal · 0.99mm/px · 3 of 126 slices shown]
[im 42/126  soft-tissue]
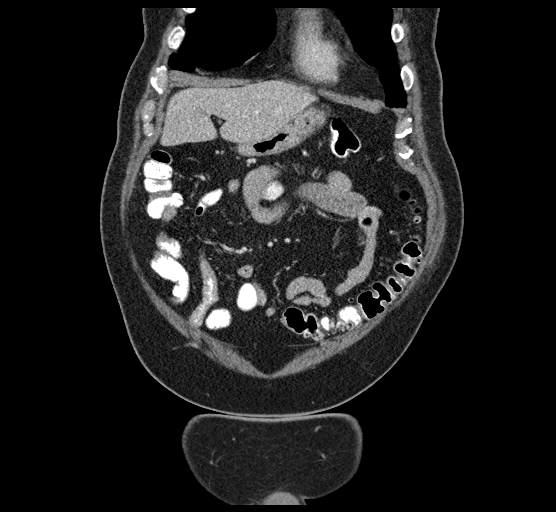
[im 56/126  soft-tissue]
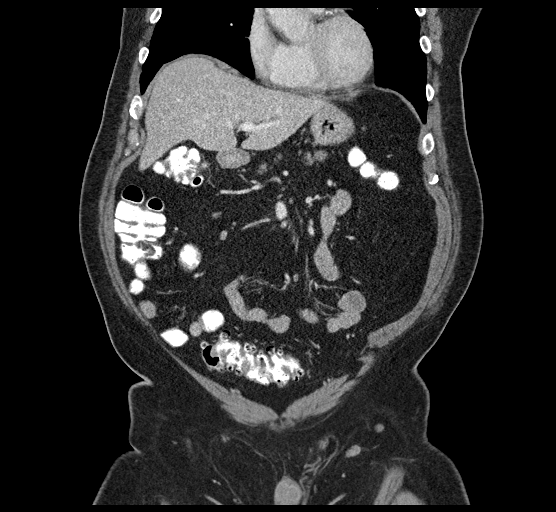
[im 70/126  soft-tissue]
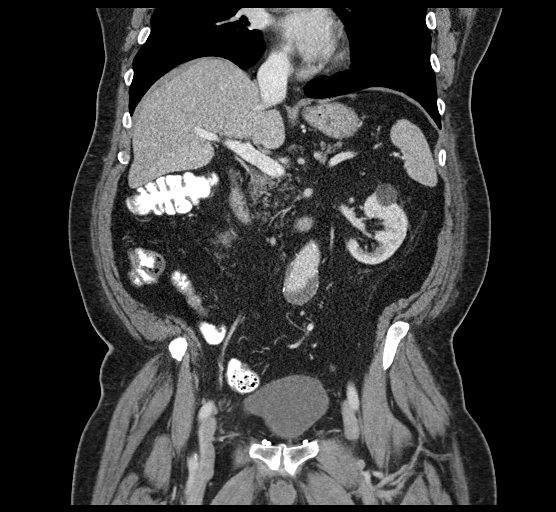

[16 of 46 positions shown; findings below may reference images not displayed]

FINDINGS: Lower chest: Lung bases are clear. No effusions. Heart is normal
size.

Hepatobiliary: Fatty infiltration of the liver. No focal abnormality
or biliary ductal dilatation. Gallbladder not visualized, presumed
prior cholecystectomy.

Pancreas: No focal abnormality or ductal dilatation.

Spleen: No focal abnormality.  Normal size.

Adrenals/Urinary Tract: Small cyst in the upper pole of the left
kidney. No hydronephrosis or suspicious mass. Adrenal glands and
urinary bladder unremarkable.

Stomach/Bowel: Appendix is normal. Sigmoid diverticulosis. Stomach
and small bowel unremarkable.

Vascular/Lymphatic: Mild aneurysmal dilatation of the infrarenal
aorta, 3.6 cm compared to 3.9 cm on prior ultrasound. Anterior
moderate plaque in scattered calcifications. No adenopathy.

Reproductive: Prostate enlargement.

Other: No free fluid or free air. Bilateral inguinal hernias and
umbilical hernia containing fat.

Musculoskeletal: No acute bony abnormality.
IMPRESSION: No ascites.

Mild fatty infiltration of the liver.

Left colonic diverticulosis.  No active diverticulitis.

Bilateral inguinal hernias, right larger than left. Small umbilical
hernia. These contain fat.

3.6 cm infrarenal abdominal aortic aneurysm compared to 3.9 cm on
prior ultrasound. Recommend followup by ultrasound in 2 years. This
recommendation follows ACR consensus guidelines: White Paper of the
ACR Incidental Findings Committee II on Vascular Findings. [HOSPITAL] 7043; [DATE].

## 2018-09-01 ENCOUNTER — Encounter: Payer: Self-pay | Admitting: Physical Therapy

## 2018-09-01 ENCOUNTER — Other Ambulatory Visit: Payer: Self-pay

## 2018-09-01 ENCOUNTER — Ambulatory Visit: Payer: Medicare Other | Admitting: Physical Therapy

## 2018-09-01 DIAGNOSIS — M6281 Muscle weakness (generalized): Secondary | ICD-10-CM | POA: Diagnosis not present

## 2018-09-01 DIAGNOSIS — M25512 Pain in left shoulder: Secondary | ICD-10-CM

## 2018-09-01 DIAGNOSIS — M25612 Stiffness of left shoulder, not elsewhere classified: Secondary | ICD-10-CM

## 2018-09-01 NOTE — Therapy (Signed)
[facility] [place], [place], [address] Phone: [phone]   Fax:  [phone]  Physical Therapy Treatment  Patient Details  Name: Edward Frank MRN: [id number] Date of Birth: [birth year] Referring Provider (PT): Jenetta Loges, PA-C/Kevin Supple MD   Encounter Date: 09/01/2018  PT End of Session - 09/01/18 1034    Visit Number  22    Number of Visits  28    Date for PT Re-Evaluation  10/02/18    Authorization Type  Progress note every 10th visit; KX modifier at 15th visit    PT Start Time  0946    PT Stop Time  1043    PT Time Calculation (min)  57 min    Activity Tolerance  Patient tolerated treatment well    Behavior During Therapy  [facility] for tasks assessed/performed       Past Medical History:  Diagnosis Date  . Arthritis   . Bradycardia   . Fatty liver   . Fracture, tibia, with fibula teenage yrs.    no surgery, wore cast  . GERD (gastroesophageal reflux disease)   . Hyperlipidemia   . Hypertension   . 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 OSA (obstructive sleep apnea) 01/22/2011  . PONV (postoperative nausea and vomiting)   . 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 Right bundle branch block   . Syncope     Past Surgical History:  Procedure Laterality Date  . CHOLECYSTECTOMY N/A 06/13/2016   Procedure: LAPAROSCOPIC CHOLECYSTECTOMY;  Surgeon: Arta Bruce Kinsinger, MD;  Location: [facility];  Service: General;  Laterality: N/A;  . EYE SURGERY     Cataracts bil  . FOOT SURGERY  11-2008   hammer toe and bunion  . HERNIA REPAIR  12-2001   inguinal hernia bilateral  . REVERSE SHOULDER ARTHROPLASTY Left 05/28/2018   Procedure: REVERSE SHOULDER ARTHROPLASTY;  Surgeon: Justice Britain, MD;  Location: [facility];  Service: Orthopedics;  Laterality: Left;  176mn  . SHOULDER ARTHROSCOPY WITH ROTATOR CUFF REPAIR AND SUBACROMIAL DECOMPRESSION Right 06/18/2012   Procedure:  RIGHT SHOULDER ARTHROSCOPY WITH SUBACROMIAL DECOMPRESSION AND DISTAL CLAVICLE RESECTION AND ROTATOR CUFF REPAIR;  Surgeon: KMarin Shutter MD;  Location: [facility]  Service: Orthopedics;  Laterality: Right;  . SHOULDER SURGERY  1977   Luxating     There were no vitals filed for this visit.  Subjective Assessment - 09/01/18 1033    Subjective  COVID-19 screening performed prior to patient entering the building. Patient reported feeling stiff.    Pertinent History  ALS, HTN, abdominal aorta aneurysm, left reverse total shoulder replacement 05/28/2018    Limitations  Lifting;House hold activities    Patient Stated Goals  decrease pain, improve function.    Currently in Pain?  Yes    Pain Score  1     Pain Location  Shoulder    Pain Orientation  Left    Pain Descriptors / Indicators  Sore    Pain Type  Surgical pain    Pain Onset  More than a month ago    Pain Frequency  Intermittent         [facility] PT Assessment - 09/01/18 0001      Assessment   Medical Diagnosis  left reverse total shoulder    Referring Provider (PT)  TJenetta Loges PA-C/Kevin Supple MD    Onset Date/Surgical Date  05/28/18    Hand Dominance  Right  Next MD Visit  July 2020    Prior Therapy  no      Precautions   Precautions  Shoulder                   [facility] Adult PT Treatment/Exercise - 09/01/18 0001      Shoulder Exercises: Standing   Protraction  Strengthening;Left;20 reps;Theraband    Theraband Level (Shoulder Protraction)  Level 1 (Yellow)    Internal Rotation  Strengthening;Left;20 reps;Theraband    Theraband Level (Shoulder Internal Rotation)  Level 1 (Yellow)    Row  Strengthening;20 reps;Theraband    Theraband Level (Shoulder Row)  Level 1 (Yellow)      Shoulder Exercises: Pulleys   Flexion  5 minutes      Shoulder Exercises: ROM/Strengthening   UBE (Upper Arm Bike)  60 RPM x8 min    Ranger  Standing UE ranger into flexion, CW, CCW x3 minutes each      Modalities   Modalities   Vasopneumatic      Vasopneumatic   Number Minutes Vasopneumatic   15 minutes    Vasopnuematic Location   Shoulder    Vasopneumatic Pressure  Low                  PT Long Term Goals - 08/13/18 1018      PT LONG TERM GOAL #1   Title  Patient will be independent with advanced HEP    Time  8    Period  Weeks    Status  Achieved      PT LONG TERM GOAL #2   Title  Patient will demonstrate 130+ degrees of left shoulder flexion AROM to improve ability to perform functional tasks.    Time  8    Period  Weeks    Status  Not Met      PT LONG TERM GOAL #3   Title  Patient will demonstrate 60+ degrees of left shoulder ER AROM to improve ability to don/doff apparel.    Time  8    Period  Weeks    Status  Not Met      PT LONG TERM GOAL #4   Title  Patient will demonstrate ability functional IR to L4 or higher to improve ability to perform lower body dressing    Time  8    Period  Weeks    Status  Partially Met   L iliac crest 08/13/2018     PT LONG TERM GOAL #5   Title  Patient will demonstrate 4-/5 or greater of left shoulder MMT to improve stability during functional task.    Time  8    Period  Weeks    Status  Achieved            Plan - 09/01/18 1046    Clinical Impression Statement  Patient was able to tolerate the progression of treatment fairly well but did note with fatigue with resisted protraction. Patient denied any increases of pain throughout treatment. Patient was educated on game ready. Normal response to modalities upon removal.     Stability/Clinical Decision Making  Stable/Uncomplicated    Clinical Decision Making  Moderate    Rehab Potential  Good    PT Frequency  3x / week    PT Duration  8 weeks    PT Treatment/Interventions  ADLs/Self Care Home Management;Electrical Stimulation;Moist Heat;Cryotherapy;Manual techniques;Passive range of motion;Scar mobilization;Therapeutic exercise;Patient/family education;Therapeutic activities    PT Next Visit  Plan  Continue  at MD discretion.    Consulted and Agree with Plan of Care  Patient       Patient will benefit from skilled therapeutic intervention in order to improve the following deficits and impairments:  Pain, Decreased strength, Decreased range of motion, Decreased activity tolerance, Impaired UE functional use, Postural dysfunction  Visit Diagnosis: Acute pain of left shoulder  Stiffness of left shoulder, not elsewhere classified  Muscle weakness (generalized)     Problem List Patient Active Problem List   Diagnosis Date Noted  . S/P reverse total shoulder arthroplasty, left 05/28/2018  . Prediabetes 11/25/2017  . Pain of left shoulder joint on movement 09/11/2017  . Bilateral leg edema 06/03/2017  . Abdominal distension 06/03/2017  . Osteoarthritis of left glenohumeral joint 01/21/2017  . Grade I internal hemorrhoids 11/22/2016  . Left foot pain 11/22/2016  . LVH (left ventricular hypertrophy) 08/24/2015  . Counseling regarding end of life decision making 11/17/2014  . DDD (degenerative disc disease), lumbosacral 01/12/2014  . Arthropathy of lumbar facet joint 01/12/2014  . Upper motor neuron disease ([place]) 01/12/2014  . AAA (abdominal aortic aneurysm) without rupture ([place]) 09/22/2012  . Spinal stenosis, lumbar 09/20/2011  . Routine general medical examination at a health care facility 09/02/2011  . OSA (obstructive sleep apnea) 01/22/2011  . RIGHT BUNDLE BRANCH BLOCK 03/26/2010  . BRADYCARDIA 02/13/2010  . HYPERCHOLESTEROLEMIA 08/02/2009  . ALS 08/02/2009  . HYPERTENSION, MILD 08/02/2009  . GERD 08/02/2009   Gabriela Eves, PT, DPT 09/01/2018, 11:00 AM  [facility] [place], [place], [address] Phone: [phone]   Fax:  [phone]  Name: Edward Frank MRN: [id number] Date of Birth: Dec 04, 1945

## 2018-09-03 ENCOUNTER — Other Ambulatory Visit: Payer: Self-pay

## 2018-09-03 ENCOUNTER — Ambulatory Visit: Payer: Medicare Other | Admitting: Physical Therapy

## 2018-09-03 DIAGNOSIS — M6281 Muscle weakness (generalized): Secondary | ICD-10-CM

## 2018-09-03 DIAGNOSIS — M25512 Pain in left shoulder: Secondary | ICD-10-CM | POA: Diagnosis not present

## 2018-09-03 DIAGNOSIS — M25612 Stiffness of left shoulder, not elsewhere classified: Secondary | ICD-10-CM

## 2018-09-03 NOTE — Therapy (Addendum)
[facility] [place], [place], [address] Phone: [phone]   Fax:  [phone]  Physical Therapy Treatment  Patient Details  Name: Edward Frank MRN: [id number] Date of Birth: 01/09/1946 Referring Provider (PT): Jenetta Loges, PA-C/Kevin Supple MD   Encounter Date: 09/03/2018  PT End of Session - 09/03/18 1114    Visit Number  23    Number of Visits  28    Date for PT Re-Evaluation  10/02/18    Authorization Type  Progress note every 10th visit; KX modifier at 15th visit    PT Start Time  0947    PT Stop Time  1049    PT Time Calculation (min)  62 min    Activity Tolerance  Patient tolerated treatment well    Behavior During Therapy  [facility] for tasks assessed/performed       Past Medical History:  Diagnosis Date  . Arthritis   . Bradycardia   . Fatty liver   . Fracture, tibia, with fibula teenage yrs.    no surgery, wore cast  . GERD (gastroesophageal reflux disease)   . Hyperlipidemia   . Hypertension   . 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 OSA (obstructive sleep apnea) 01/22/2011  . PONV (postoperative nausea and vomiting)   . 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 Right bundle branch block   . Syncope     Past Surgical History:  Procedure Laterality Date  . CHOLECYSTECTOMY N/A 06/13/2016   Procedure: LAPAROSCOPIC CHOLECYSTECTOMY;  Surgeon: Arta Bruce Kinsinger, MD;  Location: [facility];  Service: General;  Laterality: N/A;  . EYE SURGERY     Cataracts bil  . FOOT SURGERY  11-2008   hammer toe and bunion  . HERNIA REPAIR  12-2001   inguinal hernia bilateral  . REVERSE SHOULDER ARTHROPLASTY Left 05/28/2018   Procedure: REVERSE SHOULDER ARTHROPLASTY;  Surgeon: Justice Britain, MD;  Location: [facility];  Service: Orthopedics;  Laterality: Left;  126mn  . SHOULDER ARTHROSCOPY WITH ROTATOR CUFF REPAIR AND SUBACROMIAL DECOMPRESSION Right 06/18/2012   Procedure:  RIGHT SHOULDER ARTHROSCOPY WITH SUBACROMIAL DECOMPRESSION AND DISTAL CLAVICLE RESECTION AND ROTATOR CUFF REPAIR;  Surgeon: KMarin Shutter MD;  [facility]  Service: Orthopedics;  Laterality: Right;  . SHOULDER SURGERY  1977   Luxating     There were no vitals filed for this visit.  Subjective Assessment - 09/03/18 1101    Subjective  COVID-19 screen performed prior to patient entering clinic.  No new complaints.  I was on my mower a lot yesterday.    Patient is accompained by:  Family member    Pertinent History  ALS, HTN, abdominal aorta aneurysm, left reverse total shoulder replacement 05/28/2018    Limitations  Lifting;House hold activities    Patient Stated Goals  decrease pain, improve function.    Currently in Pain?  Yes    Pain Orientation  Left    Pain Onset  More than a month ago                       [facility] 0001      Exercises   Exercises  Shoulder      Shoulder Exercises: Pulleys   Flexion  5 minutes      Shoulder Exercises: ROM/Strengthening   UBE (Upper Arm Bike)  120 RPM's x 8 minutes.      Modalities  Modalities  Vasopneumatic      Vasopneumatic   Number Minutes Vasopneumatic   20 minutes    Vasopnuematic Location   --   Left shoulder.   Vasopneumatic Pressure  Medium      Manual Therapy   Passive ROM  In supine:  PROM x 23 minutes to patient's left shoulder into flexion and ER with low load long duration stretching also utilized.                  PT Long Term Goals - 08/13/18 1018      PT LONG TERM GOAL #1   Title  Patient will be independent with advanced HEP    Time  8    Period  Weeks    Status  Achieved      PT LONG TERM GOAL #2   Title  Patient will demonstrate 130+ degrees of left shoulder flexion AROM to improve ability to perform functional tasks.    Time  8    Period  Weeks    Status  Not Met      PT LONG TERM GOAL #3   Title  Patient will demonstrate 60+ degrees of  left shoulder ER AROM to improve ability to don/doff apparel.    Time  8    Period  Weeks    Status  Not Met      PT LONG TERM GOAL #4   Title  Patient will demonstrate ability functional IR to L4 or higher to improve ability to perform lower body dressing    Time  8    Period  Weeks    Status  Partially Met   L iliac crest 08/13/2018     PT LONG TERM GOAL #5   Title  Patient will demonstrate 4-/5 or greater of left shoulder MMT to improve stability during functional task.    Time  8    Period  Weeks    Status  Achieved            Plan - 09/03/18 1113    Clinical Impression Statement  Patient pleased with his progress though he continues to have range of motion restrictions.      PT Treatment/Interventions  ADLs/Self Care Home Management;Electrical Stimulation;Moist Heat;Cryotherapy;Manual techniques;Passive range of motion;Scar mobilization;Therapeutic exercise;Patient/family education;Therapeutic activities    PT Next Visit Plan  Continue at MD discretion.    PT Home Exercise Plan  HEP- upper cuts, table ER and flexion slides    Consulted and Agree with Plan of Care  Patient    Family Member Consulted  Wife       Patient will benefit from skilled therapeutic intervention in order to improve the following deficits and impairments:  Pain, Decreased strength, Decreased range of motion, Decreased activity tolerance, Impaired UE functional use, Postural dysfunction  Visit Diagnosis: Acute pain of left shoulder  Stiffness of left shoulder, not elsewhere classified  Muscle weakness (generalized)     Problem List Patient Active Problem List   Diagnosis Date Noted  . S/P reverse total shoulder arthroplasty, left 05/28/2018  . Prediabetes 11/25/2017  . Pain of left shoulder joint on movement 09/11/2017  . Bilateral leg edema 06/03/2017  . Abdominal distension 06/03/2017  . Osteoarthritis of left glenohumeral joint 01/21/2017  . Grade I internal hemorrhoids 11/22/2016   . Left foot pain 11/22/2016  . LVH (left ventricular hypertrophy) 08/24/2015  . Counseling regarding end of life decision making 11/17/2014  . DDD (degenerative disc disease), lumbosacral 01/12/2014  .  Arthropathy of lumbar facet joint 01/12/2014  . Upper motor neuron disease ([place]) 01/12/2014  . AAA (abdominal aortic aneurysm) without rupture ([place]) 09/22/2012  . Spinal stenosis, lumbar 09/20/2011  . Routine general medical examination at a health care facility 09/02/2011  . OSA (obstructive sleep apnea) 01/22/2011  . RIGHT BUNDLE BRANCH BLOCK 03/26/2010  . BRADYCARDIA 02/13/2010  . HYPERCHOLESTEROLEMIA 08/02/2009  . ALS 08/02/2009  . HYPERTENSION, MILD 08/02/2009  . GERD 08/02/2009    APPLEGATE, [place] MPT 09/03/2018, 2:20 PM  [facility] [address] [place], [place], [address] Phone: [phone]   Fax:  [phone]  Name: Edward Frank MRN: [id number] Date of Birth: March 27, 1946

## 2018-09-08 ENCOUNTER — Other Ambulatory Visit: Payer: Self-pay

## 2018-09-08 ENCOUNTER — Ambulatory Visit: Payer: Medicare Other | Admitting: Physical Therapy

## 2018-09-08 ENCOUNTER — Encounter: Payer: Self-pay | Admitting: Physical Therapy

## 2018-09-08 DIAGNOSIS — M25512 Pain in left shoulder: Secondary | ICD-10-CM | POA: Diagnosis not present

## 2018-09-08 DIAGNOSIS — M25612 Stiffness of left shoulder, not elsewhere classified: Secondary | ICD-10-CM

## 2018-09-08 DIAGNOSIS — M6281 Muscle weakness (generalized): Secondary | ICD-10-CM

## 2018-09-08 NOTE — Therapy (Signed)
[facility] [place], [place], [address] Phone: [phone]   Fax:  [phone]  Physical Therapy Treatment  Patient Details  Name: Edward Frank MRN: [id number] Date of Birth: 1945/08/05 Referring Provider (PT): Jenetta Loges, PA-C/Kevin Supple MD   Encounter Date: 09/08/2018  PT End of Session - 09/08/18 1305    Visit Number  24    Number of Visits  28    Date for PT Re-Evaluation  10/02/18    Authorization Type  Progress note every 10th visit; KX modifier at 15th visit    PT Start Time  1301    PT Stop Time  1347    PT Time Calculation (min)  46 min    Activity Tolerance  Patient tolerated treatment well    Behavior During Therapy  [facility] for tasks assessed/performed       Past Medical History:  Diagnosis Date  . Arthritis   . Bradycardia   . Fatty liver   . Fracture, tibia, with fibula teenage yrs.    no surgery, wore cast  . GERD (gastroesophageal reflux disease)   . Hyperlipidemia   . Hypertension   . 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 OSA (obstructive sleep apnea) 01/22/2011  . PONV (postoperative nausea and vomiting)   . 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 Right bundle branch block   . Syncope     Past Surgical History:  Procedure Laterality Date  . CHOLECYSTECTOMY N/A 06/13/2016   Procedure: LAPAROSCOPIC CHOLECYSTECTOMY;  Surgeon: Arta Bruce Kinsinger, MD;  [facility];  Service: General;  Laterality: N/A;  . EYE SURGERY     Cataracts bil  . FOOT SURGERY  11-2008   hammer toe and bunion  . HERNIA REPAIR  12-2001   inguinal hernia bilateral  . REVERSE SHOULDER ARTHROPLASTY Left 05/28/2018   Procedure: REVERSE SHOULDER ARTHROPLASTY;  Surgeon: Justice Britain, MD;  [facility];  Service: Orthopedics;  Laterality: Left;  123mn  . SHOULDER ARTHROSCOPY WITH ROTATOR CUFF REPAIR AND SUBACROMIAL DECOMPRESSION Right 06/18/2012   Procedure:  RIGHT SHOULDER ARTHROSCOPY WITH SUBACROMIAL DECOMPRESSION AND DISTAL CLAVICLE RESECTION AND ROTATOR CUFF REPAIR;  Surgeon: KMarin Shutter MD;  [facility]  Service: Orthopedics;  Laterality: Right;  . SHOULDER SURGERY  1977   Luxating     There were no vitals filed for this visit.  Subjective Assessment - 09/08/18 1302    Subjective  COVID-19 screen performed prior to patient entering clinic.  Reports stiffness primarily after waking in the morning but limbers up as the day progresses. Reports that he has been laying on his right side and having his LUE behind him and that has been helping him stretch for more IR.    Pertinent History  ALS, HTN, abdominal aorta aneurysm, left reverse total shoulder replacement 05/28/2018    Limitations  Lifting;House hold activities    Patient Stated Goals  decrease pain, improve function.    Currently in Pain?  Yes    Pain Score  1     Pain Location  Shoulder    Pain Orientation  Left    Pain Descriptors / Indicators  Discomfort    Pain Type  Surgical pain    Pain Onset  More than a month ago    Pain Frequency  Intermittent         [facility] PT Assessment - 09/08/18 0001      Assessment  Medical Diagnosis  left reverse total shoulder    Referring Provider (PT)  Jenetta Loges, PA-C/Kevin Supple MD    Onset Date/Surgical Date  05/28/18    Hand Dominance  Right    Next MD Visit  July 2020    Prior Therapy  no      Precautions   Precautions  Shoulder                   [facility] Adult PT Treatment/Exercise - 09/08/18 0001      Shoulder Exercises: Seated   Extension  Strengthening;Left;20 reps;Theraband    Theraband Level (Shoulder Extension)  Level 1 (Yellow)    Retraction  Strengthening;Left;20 reps;Theraband    Theraband Level (Shoulder Retraction)  Level 1 (Yellow)    Protraction  Strengthening;Left;20 reps;Theraband    Theraband Level (Shoulder Protraction)  Level 1 (Yellow)    External Rotation  Strengthening;Left;20  reps;Theraband    Theraband Level (Shoulder External Rotation)  Level 1 (Yellow)    Internal Rotation  Strengthening;Left;20 reps;Theraband    Theraband Level (Shoulder Internal Rotation)  Level 1 (Yellow)    Flexion  Strengthening;Left;20 reps;Theraband    Theraband Level (Shoulder Flexion)  Level 1 (Yellow)    Diagonals  AROM;Left;20 reps;Theraband    Other Seated Exercises  L bicep curl 2# x30 reps    Other Seated Exercises  L tricep ext 2# x30 reps      Shoulder Exercises: Standing   Other Standing Exercises  1# handweight to mid cabinet x10 reps, 2# x10 reps to mid cabinet      Shoulder Exercises: Pulleys   Flexion  3 minutes      Shoulder Exercises: ROM/Strengthening   UBE (Upper Arm Bike)  30 RPM x8 min      Modalities   Modalities  Vasopneumatic      Vasopneumatic   Number Minutes Vasopneumatic   15 minutes    Vasopnuematic Location   Shoulder    Vasopneumatic Pressure  Medium    Vasopneumatic Temperature   34                  PT Long Term Goals - 08/13/18 1018      PT LONG TERM GOAL #1   Title  Patient will be independent with advanced HEP    Time  8    Period  Weeks    Status  Achieved      PT LONG TERM GOAL #2   Title  Patient will demonstrate 130+ degrees of left shoulder flexion AROM to improve ability to perform functional tasks.    Time  8    Period  Weeks    Status  Not Met      PT LONG TERM GOAL #3   Title  Patient will demonstrate 60+ degrees of left shoulder ER AROM to improve ability to don/doff apparel.    Time  8    Period  Weeks    Status  Not Met      PT LONG TERM GOAL #4   Title  Patient will demonstrate ability functional IR to L4 or higher to improve ability to perform lower body dressing    Time  8    Period  Weeks    Status  Partially Met   L iliac crest 08/13/2018     PT LONG TERM GOAL #5   Title  Patient will demonstrate 4-/5 or greater of left shoulder MMT to improve stability during functional task.    Time  8     Period  Weeks    Status  Achieved            Plan - 09/08/18 1332    Clinical Impression Statement  Patient presented in clinic with only minimal discomfort reported intermittantly with flexion. Patient able to complete all exercises with minimal to moderate L shoulder elevation especially with any flexion exercises. Resistance exercises completed today as well as with VCs and demo required for technique. Patient's greatest strength deficit would be with L shoulder ER. Normal vasopneumatic response noted following removal of the modality.    Stability/Clinical Decision Making  Stable/Uncomplicated    Rehab Potential  Good    PT Frequency  3x / week    PT Duration  8 weeks    PT Treatment/Interventions  ADLs/Self Care Home Management;Electrical Stimulation;Moist Heat;Cryotherapy;Manual techniques;Passive range of motion;Scar mobilization;Therapeutic exercise;Patient/family education;Therapeutic activities    PT Next Visit Plan  Continue at MD discretion.    PT Home Exercise Plan  HEP- upper cuts, table ER and flexion slides    Consulted and Agree with Plan of Care  Patient       Patient will benefit from skilled therapeutic intervention in order to improve the following deficits and impairments:  Pain, Decreased strength, Decreased range of motion, Decreased activity tolerance, Impaired UE functional use, Postural dysfunction  Visit Diagnosis: Acute pain of left shoulder  Stiffness of left shoulder, not elsewhere classified  Muscle weakness (generalized)     Problem List Patient Active Problem List   Diagnosis Date Noted  . S/P reverse total shoulder arthroplasty, left 05/28/2018  . Prediabetes 11/25/2017  . Pain of left shoulder joint on movement 09/11/2017  . Bilateral leg edema 06/03/2017  . Abdominal distension 06/03/2017  . Osteoarthritis of left glenohumeral joint 01/21/2017  . Grade I internal hemorrhoids 11/22/2016  . Left foot pain 11/22/2016  . LVH (left  ventricular hypertrophy) 08/24/2015  . Counseling regarding end of life decision making 11/17/2014  . DDD (degenerative disc disease), lumbosacral 01/12/2014  . Arthropathy of lumbar facet joint 01/12/2014  . 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 AAA (abdominal aortic aneurysm) without rupture ([place]) 09/22/2012  . Spinal stenosis, lumbar 09/20/2011  . Routine general medical examination at a health care facility 09/02/2011  . OSA (obstructive sleep apnea) 01/22/2011  . RIGHT BUNDLE BRANCH BLOCK 03/26/2010  . BRADYCARDIA 02/13/2010  . HYPERCHOLESTEROLEMIA 08/02/2009  . ALS 08/02/2009  . HYPERTENSION, MILD 08/02/2009  . GERD 08/02/2009    Standley Brooking, PTA 09/08/2018, 2:02 PM  [facility] [place], [place], [address] Phone: [phone]   Fax:  [phone]  Name: ZAIRE LEVESQUE MRN: [id number] Date of Birth: 10-Nov-1945

## 2018-09-10 ENCOUNTER — Other Ambulatory Visit: Payer: Self-pay

## 2018-09-10 ENCOUNTER — Encounter: Payer: Self-pay | Admitting: Physical Therapy

## 2018-09-10 ENCOUNTER — Ambulatory Visit: Payer: Medicare Other | Admitting: Physical Therapy

## 2018-09-10 DIAGNOSIS — M25612 Stiffness of left shoulder, not elsewhere classified: Secondary | ICD-10-CM | POA: Diagnosis not present

## 2018-09-10 DIAGNOSIS — M25512 Pain in left shoulder: Secondary | ICD-10-CM

## 2018-09-10 DIAGNOSIS — M6281 Muscle weakness (generalized): Secondary | ICD-10-CM | POA: Diagnosis not present

## 2018-09-10 NOTE — Therapy (Signed)
Edward Frank, [place], [address] Phone: [phone]   Fax:  [phone]  Physical Therapy Treatment  Patient Details  Name: Edward Frank MRN: [id number] Date of Birth: 11-09-1945 Referring Provider (PT): Jenetta Loges, PA-C/Kevin Supple MD   Encounter Date: 09/10/2018  PT End of Session - 09/10/18 0949    Visit Number  25    Number of Visits  28    Date for PT Re-Evaluation  10/02/18    Authorization Type  Progress note every 10th visit; KX modifier at 15th visit    PT Start Time  0949    PT Stop Time  1040    PT Time Calculation (min)  51 min    Activity Tolerance  Patient tolerated treatment well    Behavior During Therapy  [facility] for tasks assessed/performed       Past Medical History:  Diagnosis Date  . Arthritis   . Bradycardia   . Fatty liver   . Fracture, tibia, with fibula teenage yrs.    no surgery, wore cast  . GERD (gastroesophageal reflux disease)   . Hyperlipidemia   . Hypertension   . 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 OSA (obstructive sleep apnea) 01/22/2011  . PONV (postoperative nausea and vomiting)   . 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 Right bundle branch block   . Syncope     Past Surgical History:  Procedure Laterality Date  . CHOLECYSTECTOMY N/A 06/13/2016   Procedure: LAPAROSCOPIC CHOLECYSTECTOMY;  Surgeon: Arta Bruce Kinsinger, MD;  [facility];  Service: General;  Laterality: N/A;  . EYE SURGERY     Cataracts bil  . FOOT SURGERY  11-2008   hammer toe and bunion  . HERNIA REPAIR  12-2001   inguinal hernia bilateral  . REVERSE SHOULDER ARTHROPLASTY Left 05/28/2018   Procedure: REVERSE SHOULDER ARTHROPLASTY;  Surgeon: Justice Britain, MD;  [facility];  Service: Orthopedics;  Laterality: Left;  162mn  . SHOULDER ARTHROSCOPY WITH ROTATOR CUFF REPAIR AND SUBACROMIAL DECOMPRESSION Right 06/18/2012   Procedure:  RIGHT SHOULDER ARTHROSCOPY WITH SUBACROMIAL DECOMPRESSION AND DISTAL CLAVICLE RESECTION AND ROTATOR CUFF REPAIR;  Surgeon: KMarin Shutter MD;  [facility]  Service: Orthopedics;  Laterality: Right;  . SHOULDER SURGERY  1977   Luxating     There were no vitals filed for this visit.  Subjective Assessment - 09/10/18 0948    Subjective  COVID-19 screen performed prior to patient entering clinic. Patient reported feeling sore in the left shoulder. He stated he tripped and stretched his hand out on the door to prevent a fall. He states there's no pain, just felt like its been worked out.    Pertinent History  ALS, HTN, abdominal aorta aneurysm, left reverse total shoulder replacement 05/28/2018    Limitations  Lifting;House hold activities    Patient Stated Goals  decrease pain, improve function.    Currently in Pain?  Yes    Pain Score  1     Pain Location  Shoulder    Pain Orientation  Left    Pain Descriptors / Indicators  Sore;Discomfort    Pain Type  Surgical pain    Pain Onset  More than a month ago    Pain Frequency  Intermittent         [facility] PT Assessment - 09/10/18 0001      Assessment   Medical Diagnosis  left reverse total  shoulder    Referring Provider (PT)  Jenetta Loges, PA-C/Kevin Supple MD    Onset Date/Surgical Date  05/28/18    Hand Dominance  Right    Next MD Visit  July 2020    Prior Therapy  no      Precautions   Precautions  Shoulder    Type of Shoulder Precautions  follow Reverse TSA protocol                   [facility] Adult PT Treatment/Exercise - 09/10/18 0001      Shoulder Exercises: Seated   Extension  Strengthening;Left;20 reps;Theraband    Theraband Level (Shoulder Extension)  Level 1 (Yellow)    Retraction  Strengthening;Left;20 reps;Theraband    Theraband Level (Shoulder Retraction)  Level 1 (Yellow)    Protraction  Strengthening;Left;20 reps;Theraband    Theraband Level (Shoulder Protraction)  Level 1 (Yellow)    External  Rotation  Strengthening;Left;20 reps;Theraband    Theraband Level (Shoulder External Rotation)  Level 1 (Yellow)    Internal Rotation  Strengthening;Left;20 reps;Theraband    Theraband Level (Shoulder Internal Rotation)  Level 1 (Yellow)    Flexion  Strengthening;Left;20 reps;Theraband    Theraband Level (Shoulder Flexion)  Level 1 (Yellow)    Other Seated Exercises  L bicep curl 2# x30 reps    Other Seated Exercises  L tricep ext 2# x30 reps      Shoulder Exercises: Standing   Flexion  Strengthening;Left;20 reps;Weights    Shoulder Flexion Weight (lbs)  2    Flexion Limitations  to bottom and middle shelf      Shoulder Exercises: Pulleys   Flexion  3 minutes      Shoulder Exercises: ROM/Strengthening   UBE (Upper Arm Bike)  30 RPM x8 min      Modalities   Modalities  Vasopneumatic      Vasopneumatic   Number Minutes Vasopneumatic   15 minutes    Vasopnuematic Location   Shoulder    Vasopneumatic Pressure  Medium    Vasopneumatic Temperature   34                  PT Long Term Goals - 08/13/18 1018      PT LONG TERM GOAL #1   Title  Patient will be independent with advanced HEP    Time  8    Period  Weeks    Status  Achieved      PT LONG TERM GOAL #2   Title  Patient will demonstrate 130+ degrees of left shoulder flexion AROM to improve ability to perform functional tasks.    Time  8    Period  Weeks    Status  Not Met      PT LONG TERM GOAL #3   Title  Patient will demonstrate 60+ degrees of left shoulder ER AROM to improve ability to don/doff apparel.    Time  8    Period  Weeks    Status  Not Met      PT LONG TERM GOAL #4   Title  Patient will demonstrate ability functional IR to L4 or higher to improve ability to perform lower body dressing    Time  8    Period  Weeks    Status  Partially Met   L iliac crest 08/13/2018     PT LONG TERM GOAL #5   Title  Patient will demonstrate 4-/5 or greater of left shoulder MMT to improve stability during  functional task.    Time  8    Period  Weeks    Status  Achieved            Plan - 09/10/18 1049    Clinical Impression Statement  Despite reports of soreness in the left shoulder, patient was able to tolerate treatment well. Patient still noted with ongoing strength and ROM deficits but states that he noticed an improvement in function most specifially pushing up from a chair. No adverse affects noted upon removal of modalities.     Stability/Clinical Decision Making  Stable/Uncomplicated    Clinical Decision Making  Moderate    Rehab Potential  Good    PT Frequency  3x / week    PT Duration  8 weeks    PT Treatment/Interventions  ADLs/Self Care Home Management;Electrical Stimulation;Moist Heat;Cryotherapy;Manual techniques;Passive range of motion;Scar mobilization;Therapeutic exercise;Patient/family education;Therapeutic activities    PT Next Visit Plan  Continue at MD discretion. assess goals and ROM for MD note for more visits    PT Home Exercise Plan  rockwood x4 yellow theraband    Consulted and Agree with Plan of Care  Patient       Patient will benefit from skilled therapeutic intervention in order to improve the following deficits and impairments:  Pain, Decreased strength, Decreased range of motion, Decreased activity tolerance, Impaired UE functional use, Postural dysfunction  Visit Diagnosis: Acute pain of left shoulder  Stiffness of left shoulder, not elsewhere classified  Muscle weakness (generalized)     Problem List Patient Active Problem List   Diagnosis Date Noted  . S/P reverse total shoulder arthroplasty, left 05/28/2018  . Prediabetes 11/25/2017  . Pain of left shoulder joint on movement 09/11/2017  . Bilateral leg edema 06/03/2017  . Abdominal distension 06/03/2017  . Osteoarthritis of left glenohumeral joint 01/21/2017  . Grade I internal hemorrhoids 11/22/2016  . Left foot pain 11/22/2016  . LVH (left ventricular hypertrophy) 08/24/2015  .  Counseling regarding end of life decision making 11/17/2014  . DDD (degenerative disc disease), lumbosacral 01/12/2014  . Arthropathy of lumbar facet joint 01/12/2014  . 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 AAA (abdominal aortic aneurysm) without rupture ([place]) 09/22/2012  . Spinal stenosis, lumbar 09/20/2011  . Routine general medical examination at a health care facility 09/02/2011  . OSA (obstructive sleep apnea) 01/22/2011  . RIGHT BUNDLE BRANCH BLOCK 03/26/2010  . BRADYCARDIA 02/13/2010  . HYPERCHOLESTEROLEMIA 08/02/2009  . ALS 08/02/2009  . HYPERTENSION, MILD 08/02/2009  . GERD 08/02/2009   Gabriela Eves, PT, DPT 09/10/2018, 11:13 AM  [facility] [place], [place], [address] Phone: [phone]   Fax:  [phone]  Name: BALLARD BUDNEY MRN: [id number] Date of Birth: 01-01-1946

## 2018-09-15 ENCOUNTER — Telehealth: Payer: Self-pay | Admitting: Cardiology

## 2018-09-15 DIAGNOSIS — Z79899 Other long term (current) drug therapy: Secondary | ICD-10-CM | POA: Diagnosis not present

## 2018-09-15 DIAGNOSIS — R55 Syncope and collapse: Secondary | ICD-10-CM | POA: Diagnosis not present

## 2018-09-15 DIAGNOSIS — R471 Dysarthria and anarthria: Secondary | ICD-10-CM | POA: Diagnosis not present

## 2018-09-15 DIAGNOSIS — G1221 Amyotrophic lateral sclerosis: Secondary | ICD-10-CM | POA: Diagnosis not present

## 2018-09-15 NOTE — Telephone Encounter (Signed)
He needs an in person visit and it might need to be with the DOD or with an APP.

## 2018-09-15 NOTE — Telephone Encounter (Signed)
Pts wife called to report the pt has had 2 syncopal/passing out episodes lasting 5 minutes each since December. She says it does not seem to be something he has had in the past, no seizure like movement, and he has no memory of what happened. The first time he was just walking through the house, the second time his son found him outside. She says in the past laughing would cause it but nothing seemed to cause these episodes. He saw Dr. Hosie Poisson the ALS specialist at [facility] today and he does not feel it is neuro but still planning to do a CT and EEG. Pt had shoulder surgery 05/2018 and has been doing fine with PT.   Dr. Hosie Poisson would like the pt seen by Dr. Percival Spanish soon. Pt next visit not until 10/2018. Will forward to Dr. Percival Spanish for review and to see when he would like ot see the pt and also virtual vs. In person.

## 2018-09-15 NOTE — Telephone Encounter (Signed)
New Message    Pts wife is calling and says that the Pt will faint out of no where. She says he saw the Neurologist and they said he should see Cardiology.    Please call

## 2018-09-16 ENCOUNTER — Ambulatory Visit: Payer: Medicare Other | Admitting: Physical Therapy

## 2018-09-16 ENCOUNTER — Encounter: Payer: Self-pay | Admitting: Physical Therapy

## 2018-09-16 ENCOUNTER — Other Ambulatory Visit: Payer: Self-pay

## 2018-09-16 ENCOUNTER — Telehealth: Payer: Self-pay

## 2018-09-16 DIAGNOSIS — M25612 Stiffness of left shoulder, not elsewhere classified: Secondary | ICD-10-CM

## 2018-09-16 DIAGNOSIS — M6281 Muscle weakness (generalized): Secondary | ICD-10-CM | POA: Diagnosis not present

## 2018-09-16 DIAGNOSIS — M25512 Pain in left shoulder: Secondary | ICD-10-CM

## 2018-09-16 NOTE — Therapy (Signed)
[facility] [place], [place], [address] Phone: [phone]   Fax:  [phone]  Physical Therapy Treatment  Patient Details  Name: Edward Frank MRN: [id number] Date of Birth: Jul 02, 1945 Referring Provider (PT): Jenetta Loges, PA-C/Kevin Supple MD   Encounter Date: 09/16/2018  PT End of Session - 09/16/18 1350    Visit Number  26    Number of Visits  28    Date for PT Re-Evaluation  10/02/18    Authorization Type  Progress note every 10th visit; KX modifier at 15th visit    PT Start Time  1017    PT Stop Time  1111    PT Time Calculation (min)  54 min    Activity Tolerance  Patient tolerated treatment well    Behavior During Therapy  [facility] for tasks assessed/performed       Past Medical History:  Diagnosis Date  . Arthritis   . Bradycardia   . Fatty liver   . Fracture, tibia, with fibula teenage yrs.    no surgery, wore cast  . GERD (gastroesophageal reflux disease)   . Hyperlipidemia   . Hypertension   . 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 OSA (obstructive sleep apnea) 01/22/2011  . PONV (postoperative nausea and vomiting)   . 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 Right bundle branch block   . Syncope     Past Surgical History:  Procedure Laterality Date  . CHOLECYSTECTOMY N/A 06/13/2016   Procedure: LAPAROSCOPIC CHOLECYSTECTOMY;  Surgeon: Arta Bruce Kinsinger, MD;  Location: [facility];  Service: General;  Laterality: N/A;  . EYE SURGERY     Cataracts bil  . FOOT SURGERY  11-2008   hammer toe and bunion  . HERNIA REPAIR  12-2001   inguinal hernia bilateral  . REVERSE SHOULDER ARTHROPLASTY Left 05/28/2018   Procedure: REVERSE SHOULDER ARTHROPLASTY;  Surgeon: Justice Britain, MD;  Location: [facility];  Service: Orthopedics;  Laterality: Left;  162mn  . SHOULDER ARTHROSCOPY WITH ROTATOR CUFF REPAIR AND SUBACROMIAL DECOMPRESSION Right 06/18/2012   Procedure:  RIGHT SHOULDER ARTHROSCOPY WITH SUBACROMIAL DECOMPRESSION AND DISTAL CLAVICLE RESECTION AND ROTATOR CUFF REPAIR;  Surgeon: KMarin Shutter MD;  [facility]  Service: Orthopedics;  Laterality: Right;  . SHOULDER SURGERY  1977   Luxating     There were no vitals filed for this visit.  Subjective Assessment - 09/16/18 1350    Subjective  COVID-19 screen performed prior to patient entering clinic.  Shoulder feels tight and sore.    Pertinent History  ALS, HTN, abdominal aorta aneurysm, left reverse total shoulder replacement 05/28/2018    Limitations  Lifting;House hold activities    Patient Stated Goals  decrease pain, improve function.    Currently in Pain?  Yes    Pain Score  2     Pain Location  Shoulder    Pain Orientation  Left    Pain Descriptors / Indicators  Sore;Discomfort    Pain Type  Surgical pain    Pain Onset  More than a month ago                       [facility] 09[facility]16[facility]18 0001      Shoulder Exercises: Pulleys   Flexion  5 minutes      Shoulder Exercises: ROM/Strengthening   UBE (Upper Arm Bike)  90 RPM's x 8 minutes.  Vasopneumatic   Number Minutes Vasopneumatic   20 minutes    Vasopnuematic Location   --   Left shoulder.   Vasopneumatic Pressure  Medium      Manual Therapy   Passive ROM  In supine:  PROM x 10 minutes into left shoulder flexion and ER.                  PT Long Term Goals - 08/13/18 1018      PT LONG TERM GOAL #1   Title  Patient will be independent with advanced HEP    Time  8    Period  Weeks    Status  Achieved      PT LONG TERM GOAL #2   Title  Patient will demonstrate 130+ degrees of left shoulder flexion AROM to improve ability to perform functional tasks.    Time  8    Period  Weeks    Status  Not Met      PT LONG TERM GOAL #3   Title  Patient will demonstrate 60+ degrees of left shoulder ER AROM to improve ability to don/doff apparel.    Time  8    Period  Weeks     Status  Not Met      PT LONG TERM GOAL #4   Title  Patient will demonstrate ability functional IR to L4 or higher to improve ability to perform lower body dressing    Time  8    Period  Weeks    Status  Partially Met   L iliac crest 08/13/2018     PT LONG TERM GOAL #5   Title  Patient will demonstrate 4-/5 or greater of left shoulder MMT to improve stability during functional task.    Time  8    Period  Weeks    Status  Achieved            Plan - 09/16/18 1401    Clinical Impression Statement  The patient is pleased with his progress though he still lacks left shoulder ROM.  His left UT exhibited tone today.    PT Treatment/Interventions  ADLs/Self Care Home Management;Electrical Stimulation;Moist Heat;Cryotherapy;Manual techniques;Passive range of motion;Scar mobilization;Therapeutic exercise;Patient/family education;Therapeutic activities    Family Member Consulted  Wife       Patient will benefit from skilled therapeutic intervention in order to improve the following deficits and impairments:  Pain, Decreased strength, Decreased range of motion, Decreased activity tolerance, Impaired UE functional use, Postural dysfunction  Visit Diagnosis: Acute pain of left shoulder  Stiffness of left shoulder, not elsewhere classified  Muscle weakness (generalized)     Problem List Patient Active Problem List   Diagnosis Date Noted  . S/P reverse total shoulder arthroplasty, left 05/28/2018  . Prediabetes 11/25/2017  . Pain of left shoulder joint on movement 09/11/2017  . Bilateral leg edema 06/03/2017  . Abdominal distension 06/03/2017  . Osteoarthritis of left glenohumeral joint 01/21/2017  . Grade I internal hemorrhoids 11/22/2016  . Left foot pain 11/22/2016  . LVH (left ventricular hypertrophy) 08/24/2015  . Counseling regarding end of life decision making 11/17/2014  . DDD (degenerative disc disease), lumbosacral 01/12/2014  . Arthropathy of lumbar facet joint  01/12/2014  . Upper motor neuron disease ([place]) 01/12/2014  . AAA (abdominal aortic aneurysm) without rupture ([place]) 09/22/2012  . Spinal stenosis, lumbar 09/20/2011  . Routine general medical examination at a health care facility 09/02/2011  . OSA (obstructive sleep apnea) 01/22/2011  . RIGHT  BUNDLE BRANCH BLOCK 03/26/2010  . BRADYCARDIA 02/13/2010  . HYPERCHOLESTEROLEMIA 08/02/2009  . ALS 08/02/2009  . HYPERTENSION, MILD 08/02/2009  . GERD 08/02/2009    Samarrah Tranchina, [place] MPT 09/16/2018, 2:06 PM  [facility] [place], [place], [address] Phone: [phone]   Fax:  [phone]  Name: Edward Frank MRN: [id number] Date of Birth: 1945-07-29

## 2018-09-16 NOTE — Telephone Encounter (Signed)
Contacted patient to schedule an appt per Dr Rosezella Florida recommendations. Spoke with patients spouse, per dpr, for about fifteen minutes. Attempted to persuade pt to see APP but she declined and only wants to see Dr Percival Spanish. I informed her that he does not have any in office appt available until September. She requested to make a in office appt in September even though her spouse has a virtual visit on 11/03/2018.   Reached out to Dr Ms [facility] nurse to see if I can add patient on Friday due to there being a few openings.  Ok per [place] to add patient to schedule.  Spoke with wife and appt scheduled for Friday 09/18/2018 with Dr Debara Pickett. Wife voiced understanding and thanked me for my hard work and call.         COVID-19 Pre-Screening Questions:  . In the past 7 to 10 days have you had a cough,  shortness of breath, headache, congestion, fever (100 or greater) body aches, chills, sore throat, or sudden loss of taste or sense of smell? NO . Have you been around anyone with known Covid 19. NO . Have you been around anyone who is awaiting Covid 19 test results in the past 7 to 10 days? NO . Have you been around anyone who has been exposed to Covid 19, or has mentioned symptoms of Covid 19 within the past 7 to 10 days? NO  If you have any concerns/questions about symptoms patients report during screening (either on the phone or at threshold). Contact the provider seeing the patient or DOD for further guidance.  If neither are available contact a member of the leadership team.

## 2018-09-17 NOTE — Telephone Encounter (Signed)
Pt have appt 05/29 with Dr Debara Pickett

## 2018-09-18 ENCOUNTER — Other Ambulatory Visit: Payer: Self-pay

## 2018-09-18 ENCOUNTER — Encounter: Payer: Self-pay | Admitting: Physical Therapy

## 2018-09-18 ENCOUNTER — Ambulatory Visit: Payer: Medicare Other | Admitting: Physical Therapy

## 2018-09-18 ENCOUNTER — Encounter: Payer: Self-pay | Admitting: Internal Medicine

## 2018-09-18 ENCOUNTER — Ambulatory Visit: Payer: Medicare Other | Admitting: Internal Medicine

## 2018-09-18 VITALS — BP 132/86 | HR 85 | Temp 97.9°F | Resp 12 | Ht 70.0 in | Wt 259.8 lb

## 2018-09-18 DIAGNOSIS — M6281 Muscle weakness (generalized): Secondary | ICD-10-CM

## 2018-09-18 DIAGNOSIS — R55 Syncope and collapse: Secondary | ICD-10-CM

## 2018-09-18 DIAGNOSIS — I517 Cardiomegaly: Secondary | ICD-10-CM | POA: Diagnosis not present

## 2018-09-18 DIAGNOSIS — K640 First degree hemorrhoids: Secondary | ICD-10-CM

## 2018-09-18 DIAGNOSIS — I10 Essential (primary) hypertension: Secondary | ICD-10-CM | POA: Diagnosis not present

## 2018-09-18 DIAGNOSIS — M25612 Stiffness of left shoulder, not elsewhere classified: Secondary | ICD-10-CM | POA: Diagnosis not present

## 2018-09-18 DIAGNOSIS — I4510 Unspecified right bundle-branch block: Secondary | ICD-10-CM | POA: Diagnosis not present

## 2018-09-18 DIAGNOSIS — M25512 Pain in left shoulder: Secondary | ICD-10-CM | POA: Diagnosis not present

## 2018-09-18 DIAGNOSIS — I714 Abdominal aortic aneurysm, without rupture, unspecified: Secondary | ICD-10-CM

## 2018-09-18 NOTE — Patient Instructions (Signed)
Medication Instructions:  DECREASE chlorthalidone from 50mg  to 25mg  daily If you need a refill on your cardiac medications before your next appointment, please call your pharmacy.   Testing/Procedures: Your physician has requested that you have an echocardiogram. Echocardiography is a painless test that uses sound waves to create images of your heart. It provides your doctor with information about the size and shape of your heart and how well your heart's chambers and valves are working. This procedure takes approximately one hour. There are no restrictions for this procedure. -- this is done at [address]  Your physician has recommended that you wear an event monitor. Event monitors are medical devices that record the heart's electrical activity. Doctors most often [place] these monitors to diagnose arrhythmias. Arrhythmias are problems with the speed or rhythm of the heartbeat. The monitor is a small, portable device. You can wear one while you do your normal daily activities. This is usually used to diagnose what is causing palpitations/syncope (passing out). -- you will wear for 30 days -- you will receive this via mail  Follow-Up: as scheduled with Dr. Percival Spanish

## 2018-09-18 NOTE — Progress Notes (Signed)
OFFICE NOTE  Chief Complaint:  Syncope  Primary Care Physician: [name], MD  HPI:  Edward Frank is a 73 y.o. male with a past medial history significant for ALS, hypertension, hyperlipidemia and obstructive sleep apnea as well as right bundle branch block, who presented today for Dr. of the day evaluation after several recent syncopal events.  He says he falls fairly regularly, but had 2 episodes mostly at night where he got up to go the bathroom and then syncopized.  Is not clear if he had any prodrome prior to these episodes.  An EKG was performed today that I personally reviewed shows sinus rhythm first-degree AV block and right bundle branch block at 76.  Obviously there is evidence of conduction system disease.  He had had a prior echo in 2017 which showed normal systolic function.  He denied any chest pain with these episodes.  He denies any palpitations.  He is on losartan and chlorthalidone for blood pressure management.  The chlorthalidone actually is more for lower extremity edema which I suspect is due to neuropathy.  He wears compression stockings with good benefit there.  He did see his neurologist to he is ordering an EEG and CT scan however feels that the episodes are not likely neurologic.  PMHx:  Past Medical History:  Diagnosis Date  . Arthritis   . Bradycardia   . Fatty liver   . Fracture, tibia, with fibula teenage yrs.    no surgery, wore cast  . GERD (gastroesophageal reflux disease)   . Hyperlipidemia   . Hypertension   . 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 OSA (obstructive sleep apnea) 01/22/2011  . PONV (postoperative nausea and vomiting)   . 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 Right bundle branch block   . Syncope     Past Surgical History:  Procedure Laterality Date  . CHOLECYSTECTOMY N/A 06/13/2016   Procedure: LAPAROSCOPIC CHOLECYSTECTOMY;  Surgeon: Arta Bruce Kinsinger, MD;   [facility];  Service: General;  Laterality: N/A;  . EYE SURGERY     Cataracts bil  . FOOT SURGERY  11-2008   hammer toe and bunion  . HERNIA REPAIR  12-2001   inguinal hernia bilateral  . REVERSE SHOULDER ARTHROPLASTY Left 05/28/2018   Procedure: REVERSE SHOULDER ARTHROPLASTY;  Surgeon: Justice Britain, MD;  [facility];  Service: Orthopedics;  Laterality: Left;  11min  . SHOULDER ARTHROSCOPY WITH ROTATOR CUFF REPAIR AND SUBACROMIAL DECOMPRESSION Right 06/18/2012   Procedure: RIGHT SHOULDER ARTHROSCOPY WITH SUBACROMIAL DECOMPRESSION AND DISTAL CLAVICLE RESECTION AND ROTATOR CUFF REPAIR;  Surgeon: Marin Shutter, MD;  Location: [place];  Service: Orthopedics;  Laterality: Right;  . SHOULDER SURGERY  1977   Luxating     FAMHx:  Family History  Problem Relation Age of Onset  . Heart disease Mother        ? afib and got pacemaker  . Uterine cancer Mother   . Prostate cancer Father   . Hypertension Father   . Kidney failure Father   . Stroke Father   . Colon cancer Sister 74    SOCHx:   reports that he has never smoked. He has never used smokeless tobacco. He reports current alcohol use. He reports that he does not use drugs.  ALLERGIES:  Allergies  Allergen Reactions  . Gabapentin Swelling    Peripheral edema in the legs, feet, and arms Peripheral edema in the legs,  feet, and arms Peripheral edema in the legs, feet, and arms   . Riluzole Other (See Comments)    Peripheral edema in the legs, feet, arms Other reaction(s): Other (See Comments) Peripheral edema in the legs, feet, arms    ROS: Pertinent items noted in HPI and remainder of comprehensive ROS otherwise negative.  HOME MEDS: Current Outpatient Medications on File Prior to Visit  Medication Sig Dispense Refill  . aspirin EC 81 MG tablet Take 81 mg by mouth daily.    . chlorthalidone (HYGROTON) 50 MG tablet Take 25 mg by mouth daily.    . Coenzyme Q10 (COQ10) 400 MG CAPS Take 400 mg by mouth daily.    [name]  CRANBERRY PO Take 1 capsule by mouth 2 (two) times daily.     . Cyanocobalamin (VITAMIN B-12) 2500 MCG SUBL Place 2,500 mcg under the tongue daily.    . hydrocortisone (ANUSOL-HC) 2.5 % rectal cream Place 1 application rectally 2 (two) times daily. 30 g 0  . indomethacin (INDOCIN) 25 MG capsule Take 25 mg by mouth daily.     [name] losartan (COZAAR) 50 MG tablet Take 1 tablet (50 mg total) by mouth daily. 90 tablet 3  . methocarbamol (ROBAXIN) 750 MG tablet Take 750 mg by mouth 2 (two) times daily.     . Multiple Vitamin (MULTIVITAMIN WITH MINERALS) TABS Take 1 tablet by mouth daily.    . Omega-3 Fatty Acids (FISH OIL TRIPLE STRENGTH) 1400 MG CAPS Take 2,800 mg by mouth 2 (two) times daily.     [name] omeprazole (PRILOSEC) 20 MG capsule Take 40 mg by mouth daily.     [name] OVER THE COUNTER MEDICATION Take 1 tablet by mouth daily. Calcium citrate+D3 500-800 mg    . OVER THE COUNTER MEDICATION Apply 1 application topically as needed (dry skin/brusing). dermon for dry skin/brusing    . oxyCODONE (ROXICODONE) 5 MG immediate release tablet Take 1 tablet (5 mg total) by mouth every 6 (six) hours as needed for severe pain. 10 tablet 0  . pravastatin (PRAVACHOL) 20 MG tablet TAKE 1 TABLET BY MOUTH AT BEDTIME (Patient taking differently: Take 20 mg by mouth daily. ) 90 tablet 3  . RESVERATROL 100 MG CAPS Take 300 mg by mouth daily.     [name] tiZANidine (ZANAFLEX) 4 MG tablet Take 4 mg by mouth at bedtime as needed for muscle spasms.     . traMADol (ULTRAM) 50 MG tablet Take 50 mg by mouth 2 (two) times daily.      No current facility-administered medications on file prior to visit.     LABS/IMAGING: No results found for this or any previous visit (from the past 48 hour(s)). No results found.  LIPID PANEL:    Component Value Date/Time   CHOL 163 11/20/2017 0857   TRIG 202.0 (H) 11/20/2017 0857   HDL 40.80 11/20/2017 0857   CHOLHDL 4 11/20/2017 0857   VLDL 40.4 (H) 11/20/2017 0857   LDLCALC 85 11/18/2016 1120    LDLDIRECT 81.0 11/20/2017 0857     WEIGHTS: Wt Readings from Last 3 Encounters:  09/18/18 259 lb 12.8 oz (117.8 kg)  05/28/18 250 lb (113.4 kg)  05/25/18 250 lb (113.4 kg)    VITALS: BP 132/86   Pulse 85   Temp 97.9 F (36.6 C)   Resp 12   Ht 5\' 10"  (1.778 m)   Wt 259 lb 12.8 oz (117.8 kg)   SpO2 95%   BMI 37.28 kg/m   EXAM: General appearance:  alert, no distress and moderately obese Neck: no carotid bruit, no JVD and thyroid not enlarged, symmetric, no tenderness/mass/nodules Lungs: clear to auscultation bilaterally Heart: regular rate and rhythm Abdomen: soft, non-tender; bowel sounds normal; no masses,  no organomegaly Extremities: edema 1+ edema bilaterally, compression stockings in place Pulses: 2+ and symmetric Skin: Skin color, texture, turgor normal. No rashes or lesions Neurologic: Grossly normal Psych: Pleasant   EKG: Sinus rhythm with 1st degree AVB, RBBB - personally reviewed  ASSESSMENT: 1. Recurrent syncope 2. Abnormal EKG with first-degree AV block and RBBB 3. ALS 4. Hypertension  PLAN: 1.   Mr. Oatis has had several recent syncopal episodes.  It was not clear if there was a prodrome prior to these events however at least one occurred at night and was associated with getting up to go to the bathroom.  There is some question about history of micturition syncope and Dr. Rosezella Florida notes.  This was not associated with that, but may also be a form of vasovagal syncope.  In review of his medications, he is on moderate dose chlorthalidone and losartan.  Blood pressure is top normal today however may be variable.  I suspect if he is a little dried out from the medication then it may be contributing to these episodes.  I would advise decreasing his chlorthalidone from 50 to 25 mg daily.  He was hesitant to stop it completely due to lower extremity swelling.  In general the compression stockings are helpful.  I will also place a 30-day monitor to make sure there  is no arrhythmia given his abnormal EKG findings suggestive of conduction disease.  Finally will update his echocardiogram his last study was in 2017.  Follow-up after monitoring with Dr. Percival Spanish.  Pixie Casino, MD, [facility], [place] Director of the Advanced Lipid Disorders &  Cardiovascular Risk Reduction Clinic Diplomate of the American Board of Clinical Lipidology Attending Cardiologist  Direct Dial: [phone]  Fax: [phone]  Website:  [email or web address] 09/18/2018, 2:01 PM

## 2018-09-18 NOTE — Therapy (Signed)
[facility] [place], [place], [address] Phone: [phone]   Fax:  [phone]  Physical Therapy Treatment  Patient Details  Name: Edward Frank MRN: [id number] Date of Birth: 1945-10-17 Referring Provider (PT): Jenetta Loges, PA-C/Kevin Supple MD   Encounter Date: 09/18/2018  PT End of Session - 09/18/18 1016    Visit Number  27    Number of Visits  28    Date for PT Re-Evaluation  10/02/18    Authorization Type  Progress note every 10th visit; KX modifier at 15th visit    PT Start Time  1013    PT Stop Time  1102    PT Time Calculation (min)  49 min    Activity Tolerance  Patient tolerated treatment well    Behavior During Therapy  [facility] for tasks assessed/performed       Past Medical History:  Diagnosis Date  . Arthritis   . Bradycardia   . Fatty liver   . Fracture, tibia, with fibula teenage yrs.    no surgery, wore cast  . GERD (gastroesophageal reflux disease)   . Hyperlipidemia   . Hypertension   . 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 OSA (obstructive sleep apnea) 01/22/2011  . PONV (postoperative nausea and vomiting)   . Primary lateral sclerosis ([place])   . Right bundle branch block   . Syncope     Past Surgical History:  Procedure Laterality Date  . CHOLECYSTECTOMY N/A 06/13/2016   Procedure: LAPAROSCOPIC CHOLECYSTECTOMY;  Surgeon: Arta Bruce Kinsinger, MD;  Location: [facility];  Service: General;  Laterality: N/A;  . EYE SURGERY     Cataracts bil  . FOOT SURGERY  11-2008   hammer toe and bunion  . HERNIA REPAIR  12-2001   inguinal hernia bilateral  . REVERSE SHOULDER ARTHROPLASTY Left 05/28/2018   Procedure: REVERSE SHOULDER ARTHROPLASTY;  Surgeon: Justice Britain, MD;  Location: [facility];  Service: Orthopedics;  Laterality: Left;  118mn  . SHOULDER ARTHROSCOPY WITH ROTATOR CUFF REPAIR AND SUBACROMIAL DECOMPRESSION Right 06/18/2012   Procedure:  RIGHT SHOULDER ARTHROSCOPY WITH SUBACROMIAL DECOMPRESSION AND DISTAL CLAVICLE RESECTION AND ROTATOR CUFF REPAIR;  Surgeon: KMarin Shutter MD;  Location: [facility]  Service: Orthopedics;  Laterality: Right;  . SHOULDER SURGERY  1977   Luxating     There were no vitals filed for this visit.  Subjective Assessment - 09/18/18 1015    Subjective  COVID 19 screening performed prior to patient entering building. Patient reports that he has had some pain in his shoulder after sleeping on R side but is slacking off now. Woke with some stiffness today. Did have some pain radiating into his neck but that pain has since ceased.    Pertinent History  ALS, HTN, abdominal aorta aneurysm, left reverse total shoulder replacement 05/28/2018    Limitations  Lifting;House hold activities    Patient Stated Goals  decrease pain, improve function.    Currently in Pain?  Yes    Pain Score  2     Pain Location  Shoulder    Pain Orientation  Left    Pain Descriptors / Indicators  Discomfort    Pain Type  Surgical pain    Pain Onset  More than a month ago    Pain Frequency  Intermittent         [facility] PT Assessment - 09/18/18 0001      Assessment   Medical  Diagnosis  left reverse total shoulder    Referring Provider (PT)  Jenetta Loges, PA-C/Kevin Supple MD    Onset Date/Surgical Date  05/28/18    Hand Dominance  Right    Next MD Visit  July 2020    Prior Therapy  no      Precautions   Precautions  Shoulder    Type of Shoulder Precautions  follow Reverse TSA protocol                   [facility] Adult PT Treatment/Exercise - 09/18/18 0001      Elbow Exercises   Elbow Flexion  Strengthening;Left;20 reps;Seated;Bar weights/barbell    Bar Weights/Barbell (Elbow Flexion)  4 lbs    Elbow Extension  Strengthening;Left;20 reps;Seated;Theraband    Theraband Level (Elbow Extension)  Level 1 (Yellow)      Shoulder Exercises: Seated   Extension  Strengthening;Left;20 reps;Theraband    Theraband Level  (Shoulder Extension)  Level 1 (Yellow)    Retraction  Strengthening;Left;20 reps;Theraband    Theraband Level (Shoulder Retraction)  Level 1 (Yellow)    Protraction  Strengthening;Left;20 reps;Theraband    Theraband Level (Shoulder Protraction)  Level 1 (Yellow)    External Rotation  Strengthening;Left;20 reps;Theraband    Theraband Level (Shoulder External Rotation)  Level 1 (Yellow)    Internal Rotation  Strengthening;Left;20 reps;Theraband    Theraband Level (Shoulder Internal Rotation)  Level 1 (Yellow)    Flexion  AROM;Left;20 reps    Abduction  AROM;Left;20 reps    Diagonals  AROM;Left;20 reps      Shoulder Exercises: Standing   Other Standing Exercises  2# handweight to middle shelf x20 reps      Shoulder Exercises: ROM/Strengthening   UBE (Upper Arm Bike)  90 RPM's x 8 minutes.    Wall Wash  into flex, CW and CCW circles x20 reps each      Modalities   Modalities  Vasopneumatic      Vasopneumatic   Number Minutes Vasopneumatic   15 minutes    Vasopnuematic Location   Shoulder    Vasopneumatic Pressure  Medium    Vasopneumatic Temperature   34      Manual Therapy   Manual Therapy  Soft tissue mobilization    Soft tissue mobilization  TPR/STW to L deltoids, lateral pectoralis to reduce TPs and muscle tightness                  PT Long Term Goals - 08/13/18 1018      PT LONG TERM GOAL #1   Title  Patient will be independent with advanced HEP    Time  8    Period  Weeks    Status  Achieved      PT LONG TERM GOAL #2   Title  Patient will demonstrate 130+ degrees of left shoulder flexion AROM to improve ability to perform functional tasks.    Time  8    Period  Weeks    Status  Not Met      PT LONG TERM GOAL #3   Title  Patient will demonstrate 60+ degrees of left shoulder ER AROM to improve ability to don/doff apparel.    Time  8    Period  Weeks    Status  Not Met      PT LONG TERM GOAL #4   Title  Patient will demonstrate ability functional IR  to L4 or higher to improve ability to perform lower body dressing  Time  8    Period  Weeks    Status  Partially Met   L iliac crest 08/13/2018     PT LONG TERM GOAL #5   Title  Patient will demonstrate 4-/5 or greater of left shoulder MMT to improve stability during functional task.    Time  8    Period  Weeks    Status  Achieved            Plan - 09/18/18 1058    Clinical Impression Statement  Patient presents in clinic with only low level L shoulder discomfort and stiffness. Patient progressed through AROM and strengthening exercises. L bicep compensatory patterns noted as patient completes exercises with more elbow flexion especially with muscle fatigue. With more resistance during bicep curls patient observed with more shoulder adduction to complete motion. Muscle tightness palpated in lateral pectoralis and anterior to mid deltoids. Moderate release with manual therapy to treated musculature. Normal vasopneumatic response noted following removal of the modality.     Stability/Clinical Decision Making  Stable/Uncomplicated    Rehab Potential  Good    PT Frequency  3x / week    PT Duration  8 weeks    PT Treatment/Interventions  ADLs/Self Care Home Management;Electrical Stimulation;Moist Heat;Cryotherapy;Manual techniques;Passive range of motion;Scar mobilization;Therapeutic exercise;Patient/family education;Therapeutic activities    PT Next Visit Plan  D/C summary next visit.    PT Home Exercise Plan  rockwood x4 yellow theraband    Consulted and Agree with Plan of Care  Patient       Patient will benefit from skilled therapeutic intervention in order to improve the following deficits and impairments:  Pain, Decreased strength, Decreased range of motion, Decreased activity tolerance, Impaired UE functional use, Postural dysfunction  Visit Diagnosis: Acute pain of left shoulder  Stiffness of left shoulder, not elsewhere classified  Muscle weakness  (generalized)     Problem List Patient Active Problem List   Diagnosis Date Noted  . S/P reverse total shoulder arthroplasty, left 05/28/2018  . Prediabetes 11/25/2017  . Pain of left shoulder joint on movement 09/11/2017  . Bilateral leg edema 06/03/2017  . Abdominal distension 06/03/2017  . Osteoarthritis of left glenohumeral joint 01/21/2017  . Grade I internal hemorrhoids 11/22/2016  . Left foot pain 11/22/2016  . LVH (left ventricular hypertrophy) 08/24/2015  . Counseling regarding end of life decision making 11/17/2014  . DDD (degenerative disc disease), lumbosacral 01/12/2014  . Arthropathy of lumbar facet joint 01/12/2014  . Upper motor neuron disease ([place]) 01/12/2014  . AAA (abdominal aortic aneurysm) without rupture ([place]) 09/22/2012  . Spinal stenosis, lumbar 09/20/2011  . Routine general medical examination at a health care facility 09/02/2011  . OSA (obstructive sleep apnea) 01/22/2011  . RIGHT BUNDLE BRANCH BLOCK 03/26/2010  . BRADYCARDIA 02/13/2010  . HYPERCHOLESTEROLEMIA 08/02/2009  . ALS 08/02/2009  . HYPERTENSION, MILD 08/02/2009  . GERD 08/02/2009    Standley Brooking, PTA 09/18/2018, 11:34 AM  [facility] [address] [place], [place], [address] Phone: [phone]   Fax:  [phone]  Name: GLENNIE RODDA MRN: [id number] Date of Birth: 25-May-1945

## 2018-09-21 ENCOUNTER — Telehealth: Payer: Self-pay | Admitting: *Deleted

## 2018-09-21 NOTE — Telephone Encounter (Signed)
[employer] to ship 30 day cardiac event monitor to the patients home.  Instructions reviewed briefly as they are included in the monitor kit. 

## 2018-09-22 ENCOUNTER — Encounter: Payer: Self-pay | Admitting: Physical Therapy

## 2018-09-22 ENCOUNTER — Ambulatory Visit: Payer: Medicare Other | Attending: Orthopedic Surgery | Admitting: Physical Therapy

## 2018-09-22 ENCOUNTER — Other Ambulatory Visit: Payer: Self-pay

## 2018-09-22 DIAGNOSIS — M25612 Stiffness of left shoulder, not elsewhere classified: Secondary | ICD-10-CM | POA: Diagnosis not present

## 2018-09-22 DIAGNOSIS — M25512 Pain in left shoulder: Secondary | ICD-10-CM | POA: Diagnosis not present

## 2018-09-22 DIAGNOSIS — M6281 Muscle weakness (generalized): Secondary | ICD-10-CM | POA: Insufficient documentation

## 2018-09-22 NOTE — Therapy (Signed)
[facility] [place], [place], [address] Phone: [phone]   Fax:  [phone]  Physical Therapy Treatment  PHYSICAL THERAPY DISCHARGE SUMMARY  Visits from Start of Care: 28  Current functional level related to goals / functional outcomes: See below   Remaining deficits: See goals   Education / Equipment: HEP Plan: Patient agrees to discharge.  Patient goals were partially met. Patient is being discharged due to meeting the stated rehab goals.  ?????   Gabriela Eves, PT, DPT     Patient Details  Name: Edward Frank MRN: [id number] Date of Birth: [birth year] Referring Provider (PT): Jenetta Loges, PA-C/Kevin Supple MD   Encounter Date: 09/22/2018  PT End of Session - 09/22/18 1014    Visit Number  28    Number of Visits  28    Date for PT Re-Evaluation  10/02/18    Authorization Type  Progress note every 10th visit; KX modifier at 15th visit    PT Start Time  1009    PT Stop Time  1100    PT Time Calculation (min)  51 min    Activity Tolerance  Patient tolerated treatment well    Behavior During Therapy  [facility] for tasks assessed/performed       Past Medical History:  Diagnosis Date  . Arthritis   . Bradycardia   . Fatty liver   . Fracture, tibia, with fibula teenage yrs.    no surgery, wore cast  . GERD (gastroesophageal reflux disease)   . Hyperlipidemia   . Hypertension   . 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 OSA (obstructive sleep apnea) 01/22/2011  . PONV (postoperative nausea and vomiting)   . 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 Right bundle branch block   . Syncope     Past Surgical History:  Procedure Laterality Date  . CHOLECYSTECTOMY N/A 06/13/2016   Procedure: LAPAROSCOPIC CHOLECYSTECTOMY;  Surgeon: Arta Bruce Kinsinger, MD;  Location: [facility];  Service: General;  Laterality: N/A;  . EYE SURGERY     Cataracts bil  . FOOT  SURGERY  11-2008   hammer toe and bunion  . HERNIA REPAIR  12-2001   inguinal hernia bilateral  . REVERSE SHOULDER ARTHROPLASTY Left 05/28/2018   Procedure: REVERSE SHOULDER ARTHROPLASTY;  Surgeon: Justice Britain, MD;  Location: [facility];  Service: Orthopedics;  Laterality: Left;  146mn  . SHOULDER ARTHROSCOPY WITH ROTATOR CUFF REPAIR AND SUBACROMIAL DECOMPRESSION Right 06/18/2012   Procedure: RIGHT SHOULDER ARTHROSCOPY WITH SUBACROMIAL DECOMPRESSION AND DISTAL CLAVICLE RESECTION AND ROTATOR CUFF REPAIR;  Surgeon: KMarin Shutter MD;  Location: [facility]  Service: Orthopedics;  Laterality: Right;  . SHOULDER SURGERY  1977   Luxating     There were no vitals filed for this visit.  Subjective Assessment - 09/22/18 1013    Subjective  COVID 19 screening performed on patient on arrival to clinic. Patient reports stiffness as he mowed using his zero turn mower for 3 hours yesterday.    Pertinent History  ALS, HTN, abdominal aorta aneurysm, left reverse total shoulder replacement 05/28/2018    Limitations  Lifting;House hold activities    Patient Stated Goals  decrease pain, improve function.    Currently in Pain?  No/denies         [facility] - 09/22[facility]18 0001      Assessment   Medical Diagnosis  left reverse total shoulder    Referring Provider (PT)  Jenetta Loges, PA-C/Kevin Supple MD    Onset Date/Surgical Date  05/28/18    Hand Dominance  Right    Next MD Visit  July 2020    Prior Therapy  no      Precautions   Precautions  Shoulder    Type of Shoulder Precautions  follow Reverse TSA protocol      ROM / Strength   AROM / PROM / Strength  AROM      AROM   Overall AROM   Within functional limits for tasks performed    AROM Assessment Site  Shoulder    Right/Left Shoulder  Left    Left Shoulder Flexion  130 Degrees    Left Shoulder Internal Rotation  75 Degrees    Left Shoulder External Rotation  25 Degrees   limited per pain and muscle guarding                   [facility] Adult PT Treatment/Exercise - 09/22/18 0001      Elbow Exercises   Elbow Flexion  Strengthening;Left;20 reps;Seated;Bar weights/barbell    Bar Weights/Barbell (Elbow Flexion)  4 lbs    Elbow Extension  Strengthening;Left;20 reps;Seated;Theraband    Theraband Level (Elbow Extension)  Level 2 (Red)      Shoulder Exercises: Seated   Extension  Strengthening;Left;20 reps;Theraband    Theraband Level (Shoulder Extension)  Level 2 (Red)    Retraction  Strengthening;Left;20 reps;Theraband    Theraband Level (Shoulder Retraction)  Level 2 (Red)    Protraction  Strengthening;Left;20 reps;Theraband    Theraband Level (Shoulder Protraction)  Level 2 (Red)    External Rotation  Strengthening;Left;20 reps;Theraband    Theraband Level (Shoulder External Rotation)  Level 2 (Red)    Internal Rotation  Strengthening;Left;20 reps;Theraband    Theraband Level (Shoulder Internal Rotation)  Level 2 (Red)      Shoulder Exercises: Standing   Other Standing Exercises  2# handweight to middle shelf x20 reps      Shoulder Exercises: ROM/Strengthening   UBE (Upper Arm Bike)  60 RPM x8 min    Wall Wash  into flex, CW and CCW circles x20 reps each      Modalities   Modalities  Vasopneumatic      Vasopneumatic   Number Minutes Vasopneumatic   15 minutes    Vasopnuematic Location   Shoulder    Vasopneumatic Pressure  Low    Vasopneumatic Temperature   34      Manual Therapy   Manual Therapy  Soft tissue mobilization;Passive ROM    Soft tissue mobilization  STW to L lateral pectoralis, bicep to reduce muscle guarding and tightness    Passive ROM  PROM of L shoulder into ER/IR with gentle holds at end range                  PT Long Term Goals - 09/22/18 1025      PT LONG TERM GOAL #1   Title  Patient will be independent with advanced HEP    Time  8    Period  Weeks    Status  Achieved      PT LONG TERM GOAL #2   Title  Patient will demonstrate 130+  degrees of left shoulder flexion AROM to improve ability to perform functional tasks.    Time  8    Period  Weeks    Status  Achieved      PT LONG TERM GOAL #3   Title  Patient will demonstrate 60+ degrees of left shoulder ER AROM to improve ability to don/doff apparel.    Time  8    Period  Weeks    Status  Not Met      PT LONG TERM GOAL #4   Title  Patient will demonstrate ability functional IR to L4 or higher to improve ability to perform lower body dressing    Time  8    Period  Weeks    Status  Partially Met   L iliac crest 09/22/2018     PT LONG TERM GOAL #5   Title  Patient will demonstrate 4-/5 or greater of left shoulder MMT to improve stability during functional task.    Time  8    Period  Weeks    Status  Achieved            Plan - 09/22/18 1054    Clinical Impression Statement  Patient presented in clinic with reports of L shoulder stiffness and only very intermittant discomfort with certain movements at proximal L bicep. increased resistance utilized during seated therex with greatest difficulty still being with ER. Patient still very sensitive to PROM of L shoulder into ER. Firm end feels and smooth arc of motion noted with PROM although limited in ER. Patient able to achieve all goals except for ER and IR goals. Normal modality response noted following removal of the modality.    Stability/Clinical Decision Making  Stable/Uncomplicated    Rehab Potential  Good    PT Frequency  3x / week    PT Duration  8 weeks    PT Treatment/Interventions  ADLs/Self Care Home Management;Electrical Stimulation;Moist Heat;Cryotherapy;Manual techniques;Passive range of motion;Scar mobilization;Therapeutic exercise;Patient/family education;Therapeutic activities    PT Next Visit Plan  D/ C summary required.    PT Home Exercise Plan  rockwood x4 yellow theraband    Consulted and Agree with Plan of Care  Patient       Patient will benefit from skilled therapeutic intervention in  order to improve the following deficits and impairments:  Pain, Decreased strength, Decreased range of motion, Decreased activity tolerance, Impaired UE functional use, Postural dysfunction  Visit Diagnosis: Acute pain of left shoulder  Stiffness of left shoulder, not elsewhere classified  Muscle weakness (generalized)     Problem List Patient Active Problem List   Diagnosis Date Noted  . S/P reverse total shoulder arthroplasty, left 05/28/2018  . Prediabetes 11/25/2017  . Pain of left shoulder joint on movement 09/11/2017  . Bilateral leg edema 06/03/2017  . Abdominal distension 06/03/2017  . Osteoarthritis of left glenohumeral joint 01/21/2017  . Grade I internal hemorrhoids 11/22/2016  . Left foot pain 11/22/2016  . LVH (left ventricular hypertrophy) 08/24/2015  . Counseling regarding end of life decision making 11/17/2014  . DDD (degenerative disc disease), lumbosacral 01/12/2014  . Arthropathy of lumbar facet joint 01/12/2014  . Upper motor neuron disease ([place]) 01/12/2014  . AAA (abdominal aortic aneurysm) without rupture ([place]) 09/22/2012  . Spinal stenosis, lumbar 09/20/2011  . Routine general medical examination at a health care facility 09/02/2011  . OSA (obstructive sleep apnea) 01/22/2011  . RIGHT BUNDLE BRANCH BLOCK 03/26/2010  . BRADYCARDIA 02/13/2010  . HYPERCHOLESTEROLEMIA 08/02/2009  . ALS 08/02/2009  . HYPERTENSION, MILD 08/02/2009  . GERD 08/02/2009    Edward Frank, Edward Frank 09/22/18 12:09 PM   [facility] [address] [place], [place], [address] Phone: [phone]   Fax:  [phone]  Name: Edward Oravec  Frank MRN: [id number] Date of Birth: 08/05/1945

## 2018-10-07 ENCOUNTER — Telehealth (HOSPITAL_COMMUNITY): Payer: Self-pay | Admitting: *Deleted

## 2018-10-07 NOTE — Telephone Encounter (Signed)

## 2018-10-08 ENCOUNTER — Ambulatory Visit (HOSPITAL_COMMUNITY): Payer: Medicare Other | Attending: Internal Medicine

## 2018-10-08 ENCOUNTER — Other Ambulatory Visit: Payer: Self-pay

## 2018-10-08 DIAGNOSIS — R55 Syncope and collapse: Secondary | ICD-10-CM

## 2018-10-09 ENCOUNTER — Ambulatory Visit (INDEPENDENT_AMBULATORY_CARE_PROVIDER_SITE_OTHER): Payer: Medicare Other

## 2018-10-09 DIAGNOSIS — R55 Syncope and collapse: Secondary | ICD-10-CM | POA: Diagnosis not present

## 2018-10-12 ENCOUNTER — Telehealth: Payer: Self-pay | Admitting: Cardiology

## 2018-10-12 NOTE — Telephone Encounter (Signed)
Called patient's # and wife answered. Patient is not available. Wife is not on [facility] DPR. She will have patient return call. Will mail DPR form and echo report per request

## 2018-10-12 NOTE — Telephone Encounter (Signed)
New Message             Patient is returning someone's call for results. Pls call again.

## 2018-10-12 NOTE — Telephone Encounter (Signed)
Called to review 6/18 echo results with pt. Wife answered phone and stated pt not available. No DPR on file to release info to pt wife. Informed her that message would be routed to Dr. Lysbeth Penner nurse to reach out to pt to discuss results. Pt wife verbalized understanding.

## 2018-10-13 NOTE — Telephone Encounter (Signed)
Follow up    Pts wife is calling and says she is with her Husband and they would like to know the results of the Echo     Please call

## 2018-10-13 NOTE — Telephone Encounter (Signed)
Spoke with patient and wife and provided echo results - verbal OK to speak with wife.   Notes recorded by Pixie Casino, MD on 10/09/2018 at 8:32 AM EDT  Normal systolic function, mild diastolic dysfunction, mildly dilated aorta.   Dr. Lemmie Evens

## 2018-10-14 NOTE — Telephone Encounter (Signed)
Echo results and DPR form mailed

## 2018-11-03 ENCOUNTER — Telehealth: Payer: Self-pay | Admitting: *Deleted

## 2018-11-03 ENCOUNTER — Telehealth: Payer: Medicare Other | Admitting: Cardiology

## 2018-11-03 NOTE — Telephone Encounter (Signed)
SPOKE TO PATIENT .  RECEIVED  PREVENTICE  - MONITOR  FROM 11/02/18 --   STRIPS 12 BEAT VT . PATIENT  STATES  HE DOES NOT REMEMBER ANYTHING OUT OF THE ORDINARY FROM YESTERDAY .    PATIENT HAS NOTICE SWELLING IN BOTH LEGS - IT GOES DOWN SOME  AT NIGHT AND FIRST THING IN THE MORNIG. -  DOES NOT WEIGH HIMSELF DAILY .    PATIENT AWAREW WILL REVEIW WITH DR HOCHREIN  IN CHANGES WILL CALL BACK  . SCHEDULE APPT  12/22/18.

## 2018-11-03 NOTE — Telephone Encounter (Signed)
Will forward to Dr Percival Spanish and have him review strips in office today

## 2018-11-11 DIAGNOSIS — Z471 Aftercare following joint replacement surgery: Secondary | ICD-10-CM | POA: Diagnosis not present

## 2018-11-11 DIAGNOSIS — G1221 Amyotrophic lateral sclerosis: Secondary | ICD-10-CM | POA: Diagnosis not present

## 2018-11-11 DIAGNOSIS — Z96612 Presence of left artificial shoulder joint: Secondary | ICD-10-CM | POA: Diagnosis not present

## 2018-11-17 ENCOUNTER — Other Ambulatory Visit: Payer: Self-pay | Admitting: Cardiology

## 2018-11-17 DIAGNOSIS — R55 Syncope and collapse: Secondary | ICD-10-CM

## 2018-11-23 ENCOUNTER — Telehealth: Payer: Self-pay | Admitting: *Deleted

## 2018-11-23 NOTE — Telephone Encounter (Addendum)
----   Message from Minus Breeding, MD sent at 11/21/2018 10:55 AM EDT ----- Monitor did demonstrate NSVT but no etiology for the syncope.  He had no symptoms with the NSVT.  I would like to have a follow up appt with him.  This could be virtual if he prefers.  Call Edward Frank with the results and send results to [name], MD   Left message for pt to call

## 2018-11-27 NOTE — Telephone Encounter (Signed)
Left message for patient of dr hochrein's recommendations. 

## 2018-11-27 NOTE — Telephone Encounter (Signed)
Sept first is OK.

## 2018-11-27 NOTE — Telephone Encounter (Signed)
Patient would like to know if the Septebmer 1st appointment would be okay, or if it should be sooner. They would like in office visit. Thank you!

## 2018-11-27 NOTE — Telephone Encounter (Signed)
Patient's spouse returned call for test results.

## 2018-11-27 NOTE — Telephone Encounter (Signed)
Called patient, gave results.  They would like a copy mailed to them.

## 2018-12-03 ENCOUNTER — Ambulatory Visit: Payer: Medicare Other

## 2018-12-03 ENCOUNTER — Other Ambulatory Visit (INDEPENDENT_AMBULATORY_CARE_PROVIDER_SITE_OTHER): Payer: Medicare Other

## 2018-12-03 ENCOUNTER — Telehealth: Payer: Self-pay | Admitting: Family Medicine

## 2018-12-03 ENCOUNTER — Other Ambulatory Visit: Payer: Self-pay

## 2018-12-03 DIAGNOSIS — E7800 Pure hypercholesterolemia, unspecified: Secondary | ICD-10-CM | POA: Diagnosis not present

## 2018-12-03 DIAGNOSIS — R7303 Prediabetes: Secondary | ICD-10-CM | POA: Diagnosis not present

## 2018-12-03 DIAGNOSIS — Z125 Encounter for screening for malignant neoplasm of prostate: Secondary | ICD-10-CM | POA: Diagnosis not present

## 2018-12-03 LAB — COMPREHENSIVE METABOLIC PANEL
ALT: 39 U/L (ref 0–53)
AST: 30 U/L (ref 0–37)
Albumin: 4.5 g/dL (ref 3.5–5.2)
Alkaline Phosphatase: 56 U/L (ref 39–117)
BUN: 22 mg/dL (ref 6–23)
CO2: 28 mEq/L (ref 19–32)
Calcium: 9.6 mg/dL (ref 8.4–10.5)
Chloride: 102 mEq/L (ref 96–112)
Creatinine, Ser: 1.15 mg/dL (ref 0.40–1.50)
GFR: 62.01 mL/min (ref >60.00)
Glucose, Bld: 103 mg/dL — ABNORMAL HIGH (ref 70–99)
Potassium: 4.4 mEq/L (ref 3.5–5.1)
Sodium: 140 mEq/L (ref 135–145)
Total Bilirubin: 0.5 mg/dL (ref 0.2–1.2)
Total Protein: 6.7 g/dL (ref 6.0–8.3)

## 2018-12-03 LAB — LIPID PANEL
Cholesterol: 154 mg/dL (ref 0–200)
HDL: 41 mg/dL (ref >39.00)
LDL Cholesterol: 85 mg/dL (ref 0–99)
NonHDL: 112.66
Total CHOL/HDL Ratio: 4
Triglycerides: 138 mg/dL (ref 0.0–149.0)
VLDL: 27.6 mg/dL (ref 0.0–40.0)

## 2018-12-03 LAB — PSA, MEDICARE: PSA: 1.76 ng/ml (ref 0.10–4.00)

## 2018-12-03 LAB — HEMOGLOBIN A1C: Hgb A1c MFr Bld: 5.8 % (ref 4.6–6.5)

## 2018-12-03 NOTE — Telephone Encounter (Signed)
-----   Message from Ellamae Sia sent at 11/23/2018  3:56 PM EDT ----- Regarding: Lab orders for Thursday, 8.13.20 Patient is scheduled for CPX labs, please order future labs, Thanks , Karna Christmas

## 2018-12-04 ENCOUNTER — Ambulatory Visit (INDEPENDENT_AMBULATORY_CARE_PROVIDER_SITE_OTHER): Payer: Medicare Other | Admitting: Family Medicine

## 2018-12-04 ENCOUNTER — Encounter: Payer: Medicare Other | Admitting: Family Medicine

## 2018-12-04 ENCOUNTER — Other Ambulatory Visit: Payer: Self-pay

## 2018-12-04 ENCOUNTER — Other Ambulatory Visit: Payer: Medicare Other

## 2018-12-04 ENCOUNTER — Encounter: Payer: Self-pay | Admitting: Family Medicine

## 2018-12-04 VITALS — BP 120/78 | HR 88 | Temp 98.3°F | Ht 70.5 in | Wt 266.2 lb

## 2018-12-04 DIAGNOSIS — G1229 Other motor neuron disease: Secondary | ICD-10-CM

## 2018-12-04 DIAGNOSIS — E7800 Pure hypercholesterolemia, unspecified: Secondary | ICD-10-CM | POA: Diagnosis not present

## 2018-12-04 DIAGNOSIS — G1221 Amyotrophic lateral sclerosis: Secondary | ICD-10-CM | POA: Diagnosis not present

## 2018-12-04 DIAGNOSIS — R7303 Prediabetes: Secondary | ICD-10-CM

## 2018-12-04 DIAGNOSIS — Z0000 Encounter for general adult medical examination without abnormal findings: Secondary | ICD-10-CM

## 2018-12-04 DIAGNOSIS — R600 Localized edema: Secondary | ICD-10-CM

## 2018-12-04 DIAGNOSIS — I10 Essential (primary) hypertension: Secondary | ICD-10-CM | POA: Diagnosis not present

## 2018-12-04 NOTE — Progress Notes (Signed)
No critical labs need to be addressed urgently. We will discuss labs in detail at upcoming office visit.   

## 2018-12-04 NOTE — Assessment & Plan Note (Signed)
Slightly worse since decreasing diuretic in setting of possible overdiuesis. Renal function stable.

## 2018-12-04 NOTE — Patient Instructions (Signed)
Set up colonoscopy when able.  Follow up with neuro and cardiology re: syncope

## 2018-12-04 NOTE — Assessment & Plan Note (Signed)
Followed by Dr. Delford Field.

## 2018-12-04 NOTE — Assessment & Plan Note (Signed)
Stable

## 2018-12-04 NOTE — Assessment & Plan Note (Signed)
Well controlled. Continue current medication. No further syncopal episodes

## 2018-12-04 NOTE — Progress Notes (Addendum)
Chief Complaint  Patient presents with  . Medicare Wellness    History of Present Illness: HPI  The patient presents for annual medicare wellness, complete physical and review of chronic health problems. He/She also has the following acute concerns today:  I have personally reviewed the Medicare Annual Wellness questionnaire and have noted 1. [name] medical and social history 2. Their use of alcohol, tobacco or illicit drugs 3. Their current medications and supplements 4. [name] functional ability including ADL's, fall risks, home safety risks and hearing or visual             impairment. 5. Diet and physical activities 6. Evidence for depression or mood disorders 7.         Updated provider list Cognitive evaluation was performed and recorded on pt medicare questionnaire form. The patients weight, height, BMI and visual acuity have been recorded in the chart  I have made referrals, counseling and provided education to the patient based review of the above and I have provided the pt with a written personalized care plan for preventive services.   Documentation of this information was scanned into the electronic record under the media tab.   Advance directives and end of life planning reviewed in detail with patient and documented in EMR. Patient given handout on advance care directives if needed. HCPOA and living will updated if needed.  Hearing Screening   Method: Audiometry   125Hz  250Hz  500Hz  1000Hz  2000Hz  3000Hz  4000Hz  6000Hz  8000Hz   Right ear:   20 20 20  20     Left ear:   25 25 20  25       Visual Acuity Screening   Right eye Left eye Both eyes  Without correction: 20/25 20/20 20/20   With correction:      .  Going to PT for shoulder rehab  09/18/2018 Saw cardiology for syncopal spells He says he falls fairly regularly, but had 2 episodes mostly at night where he got up to go the bathroom and then syncopized.  Is not clear if he had any prodrome prior to  these episodes. Felt possibly component micturition syncope and overdiuesis.  Decreased chlorthalidone to 5 mg daily.  Placed on 30 day monitor: Monitor did demonstrate NSVT but no etiology for the syncope.  He had no symptoms with the NSVT... has follow up with cardiology in Sept. ECHO: 0/5397 Normal systolic function, mild diastolic dysfunction, mildly dilated aorta.     PLS/upper motor dominant AS: Continue slow progression change. Was followed at [place].Now followed by Dr. Colin Rhein has walker and cane.. Has not been using much. Pt doing fairly well.  Had CT to eval syncope... supposed to have EEG  Hypertension:  Stable control despite med cahnges BP Readings from Last 3 Encounters:  12/04/18 120/78  09/18/18 132/86  05/29/18 108/66  Using medication without problems or lightheadedness: none Chest pain with exertion:none Edema: moderate Short of breath:none Average home BPs: Other issues:  Wt Readings from Last 3 Encounters:  12/04/18 266 lb 4 oz (120.8 kg)  09/18/18 259 lb 12.8 oz (117.8 kg)  05/28/18 250 lb (113.4 kg)     Elevated Cholesterol:  At goal on pravastatin Lab Results  Component Value Date   CHOL 154 12/03/2018   HDL 41.00 12/03/2018   LDLCALC 85 12/03/2018   LDLDIRECT 81.0 11/20/2017   TRIG 138.0 12/03/2018   CHOLHDL 4 12/03/2018  Using medications without problems: Muscle aches:  Diet compliance: Exercise: Other complaints:  Diagnosis of fusiform Terminal AAA, .  [facility] 3.7 cm 07/17/2017: 3.9 cm, stable 11/12/2017  Stable 3.9 cm Followed q 2 years  Followed by Dr. Perc[facility].  COVID 19 screen No recent travel or known exposure to COVID19 The patient denies respiratory symptoms of COVID 19 at this time.  The importance of social distancing was discussed today.   Review of Systems  Constitutional: Negative for chills and fever.  HENT: Negative for congestion and ear pain.   Eyes: Negative for pain  and redness.  Respiratory: Positive for shortness of breath and wheezing. Negative for cough, hemoptysis and sputum production.   Cardiovascular: Negative for chest pain, palpitations and leg swelling.  Gastrointestinal: Negative for abdominal pain, blood in stool, constipation, diarrhea, nausea and vomiting.  Genitourinary: Negative for dysuria.  Musculoskeletal: Negative for falls and myalgias.  Skin: Negative for rash.  Neurological: Negative for dizziness.  Psychiatric/Behavioral: Negative for depression. The patient is not nervous/anxious.       Past Medical History:  Diagnosis Date  . Arthritis   . Bradycardia   . Fatty liver   . Fracture, tibia, with fibula teenage yrs.    no surgery, wore cast  . GERD (gastroesophageal reflux disease)   . Hyperlipidemia   . Hypertension   . 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 OSA (obstructive sleep apnea) 01/22/2011  . PONV (postoperative nausea and vomiting)   . 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 Right bundle branch block   . Syncope     reports that he has never smoked. He has never used smokeless tobacco. He reports current alcohol use. He reports that he does not use drugs.   Current Outpatient Medications:  .  aspirin EC 81 MG tablet, Take 81 mg by mouth daily., Disp: , Rfl:  .  chlorthalidone (HYGROTON) 50 MG tablet, Take 25 mg by mouth daily., Disp: , Rfl:  .  Coenzyme Q10 (COQ10) 400 MG CAPS, Take 400 mg by mouth daily., Disp: , Rfl:  .  CRANBERRY PO, Take 1 capsule by mouth 2 (two) times daily. , Disp: , Rfl:  .  Cyanocobalamin (VITAMIN B-12) 2500 MCG SUBL, Place 2,500 mcg under the tongue daily., Disp: , Rfl:  .  hydrocortisone (ANUSOL-HC) 2.5 % rectal cream, Place 1 application rectally 2 (two) times daily. (Patient taking differently: Place 1 application rectally 2 (two) times daily as needed. ), Disp: 30 g, Rfl: 0 .  indomethacin (INDOCIN) 25 MG capsule, Take 25 mg by  mouth daily. , Disp: , Rfl:  .  losartan (COZAAR) 50 MG tablet, Take 1 tablet (50 mg total) by mouth daily., Disp: 90 tablet, Rfl: 3 .  methocarbamol (ROBAXIN) 750 MG tablet, Take 750 mg by mouth 2 (two) times daily. , Disp: , Rfl:  .  Multiple Vitamin (MULTIVITAMIN WITH MINERALS) TABS, Take 1 tablet by mouth daily., Disp: , Rfl:  .  Omega-3 Fatty Acids (FISH OIL TRIPLE STRENGTH) 1400 MG CAPS, Take 2,800 mg by mouth 2 (two) times daily. , Disp: , Rfl:  .  omeprazole (PRILOSEC) 20 MG capsule, Take 40 mg by mouth daily. , Disp: , Rfl:  .  OVER THE COUNTER MEDICATION, Take 1 tablet by mouth daily. Calcium citrate+D3 500-800 mg, Disp: , Rfl:  .  OVER THE COUNTER MEDICATION, Apply 1 application topically as needed (dry skin/brusing). dermon for dry skin/brusing, Disp: , Rfl:  .  pravastatin (PRAVACHOL) 20 MG tablet, TAKE 1 TABLET BY MOUTH AT  BEDTIME (Patient taking differently: Take 20 mg by mouth daily. ), Disp: 90 tablet, Rfl: 3 .  RESVERATROL 100 MG CAPS, Take 300 mg by mouth daily. , Disp: , Rfl:  .  tiZANidine (ZANAFLEX) 4 MG tablet, Take 4 mg by mouth at bedtime as needed for muscle spasms. , Disp: , Rfl:  .  traMADol (ULTRAM) 50 MG tablet, Take 50 mg by mouth 2 (two) times daily. , Disp: , Rfl:    Observations/Objective: Pulse 88, temperature 98.3 F (36.8 C), temperature source Temporal, height 5' 10.5" (1.791 m), weight 266 lb 4 oz (120.8 kg), SpO2 96 %.  Physical Exam  Constitutional: He is oriented to person, place, and time and well-developed, well-nourished, and in no distress.  obese  HENT:  Head: Normocephalic.  Right Ear: External ear normal.  Left Ear: External ear normal.  Nose: Nose normal.  Mouth/Throat: Oropharynx is clear and moist.  Eyes: Pupils are equal, round, and reactive to light. Conjunctivae are normal. Right eye exhibits discharge. Left eye exhibits no discharge.  Neck: Normal range of motion. Neck supple. No thyromegaly present.  Cardiovascular: Normal rate,  regular rhythm, S1 normal, S2 normal and normal pulses. PMI is not displaced. Exam reveals no decreased pulses.  No murmur heard.  No systolic murmur is present. Bilateral 2 plus pitting edema  Pulmonary/Chest: Effort normal and breath sounds normal.  Abdominal: Soft. Normal appearance and bowel sounds are normal. There is no hepatosplenomegaly. There is no abdominal tenderness. There is no rebound and no CVA tenderness.  Neurological: He is alert and oriented to person, place, and time. He has normal sensation. He displays weakness and abnormal reflex. Gait abnormal.  Skin: Skin is warm and dry. No rash noted.     Assessment and Plan [name] preventative maintenance and recommended screening tests for an annual wellness exam were reviewed in full today. Brought up to date unless services declined.  Counselled on the importance of diet, exercise, and its role in overall health and mortality. [name] FH and SH was reviewed, including their home life, tobacco status, and drug and alcohol status.   Vaccines: Uptodate pneumovax/prevnar and shingles. Prostate: Father with prostate cancer age 71s. Discussed in detail, have chose to check PSA and rectal exam yearly. Lab Results  Component Value Date   PSA 1.76 12/03/2018   PSA 2.07 11/20/2017   PSA 1.93 11/18/2016   Colon: Sister with colon cancer. Had 10/12/13 [facility], repeat in 5 [facility]... [facility]: done       Eliezer Lofts, MD

## 2018-12-04 NOTE — Assessment & Plan Note (Signed)
Good control on statin. 

## 2018-12-13 NOTE — Progress Notes (Signed)
Cardiology Office Note   Date:  12/14/2018   ID:  SURENDER Frank, DOB [birth year], MRN [id number]  PCP:  [name], MD  Cardiologist:   Minus Breeding, MD    Chief Complaint  Patient presents with  . Loss of Consciousness      History of Present Illness: Edward Frank is a 73 y.o. male who presents for follow up of syncope.  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  He has had chronic leg edema and some dyspnea with activity.  He has ALS and gets around slowly and mostly in a motorized scooter.   After calling to discuss recent syncope I applied a monitor that demonstrated NSVT although he did not have symptoms with this.  He had a normal EF on echo in June.    Following his last syncopal episode he was seen by [place].  That since then he has had no further events.  He had a CT at the [place] in [place] but he has not had these results.  An EEG was canceled.  He is slowing down more more because of the ALS.  He gets around slowly with a cane.  He short of breath with activities but is not describing PND or orthopnea.  He has never felt any palpitations even when he had the 12 beat run of nonsustained VT.  He has had no further presyncope or syncope or orthostasis.  He has had no chest pressure, neck or arm discomfort.  He has chronic lower extremity swelling.   Past Medical History:  Diagnosis Date  . Arthritis   . Bradycardia   . Fatty liver   . Fracture, tibia, with fibula teenage yrs.    no surgery, wore cast  . GERD (gastroesophageal reflux disease)   . Hyperlipidemia   . Hypertension   . 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 OSA (obstructive sleep apnea) 01/22/2011  . PONV (postoperative nausea and vomiting)   . 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 Right bundle branch block   . Syncope     Past Surgical History:  Procedure Laterality Date  . CHOLECYSTECTOMY N/A  06/13/2016   Procedure: LAPAROSCOPIC CHOLECYSTECTOMY;  Surgeon: Arta Bruce Kinsinger, MD;  Location: [facility];  Service: General;  Laterality: N/A;  . EYE SURGERY     Cataracts bil  . FOOT SURGERY  11-2008   hammer toe and bunion  . HERNIA REPAIR  12-2001   inguinal hernia bilateral  . REVERSE SHOULDER ARTHROPLASTY Left 05/28/2018   Procedure: REVERSE SHOULDER ARTHROPLASTY;  Surgeon: Justice Britain, MD;  Location: [facility];  Service: Orthopedics;  Laterality: Left;  120min  . SHOULDER ARTHROSCOPY WITH ROTATOR CUFF REPAIR AND SUBACROMIAL DECOMPRESSION Right 06/18/2012   Procedure: RIGHT SHOULDER ARTHROSCOPY WITH SUBACROMIAL DECOMPRESSION AND DISTAL CLAVICLE RESECTION AND ROTATOR CUFF REPAIR;  Surgeon: Marin Shutter, MD;  Location: [facility];  Service: Orthopedics;  Laterality: Right;  . SHOULDER SURGERY  1977   Luxating      Current Outpatient Medications  Medication Sig Dispense Refill  . aspirin EC 81 MG tablet Take 81 mg by mouth daily.    . chlorthalidone (HYGROTON) 50 MG tablet Take 25 mg by mouth daily.    . Coenzyme Q10 (COQ10) 400 MG CAPS Take 400 mg by mouth daily.    [name] CRANBERRY PO Take 1 capsule by mouth 2 (two) times  daily.     . Cyanocobalamin (VITAMIN B-12) 2500 MCG SUBL Place 2,500 mcg under the tongue daily.    . hydrocortisone (ANUSOL-HC) 2.5 % rectal cream Place 1 application rectally 2 (two) times daily. (Patient taking differently: Place 1 application rectally 2 (two) times daily as needed. ) 30 g 0  . indomethacin (INDOCIN) 25 MG capsule Take 25 mg by mouth daily.     [name] losartan (COZAAR) 50 MG tablet Take 1 tablet (50 mg total) by mouth daily. 90 tablet 3  . methocarbamol (ROBAXIN) 750 MG tablet Take 750 mg by mouth 2 (two) times daily.     . Multiple Vitamin (MULTIVITAMIN WITH MINERALS) TABS Take 1 tablet by mouth daily.    . Omega-3 Fatty Acids (FISH OIL TRIPLE STRENGTH) 1400 MG CAPS Take 2,800 mg by mouth 2 (two) times daily.     [name] omeprazole (PRILOSEC) 20 MG capsule Take 40 mg  by mouth daily.     [name] OVER THE COUNTER MEDICATION Take 1 tablet by mouth daily. Calcium citrate+D3 500-800 mg    . OVER THE COUNTER MEDICATION Apply 1 application topically as needed (dry skin/brusing). dermon for dry skin/brusing    . pravastatin (PRAVACHOL) 20 MG tablet TAKE 1 TABLET BY MOUTH AT BEDTIME (Patient taking differently: Take 20 mg by mouth daily. ) 90 tablet 3  . RESVERATROL 100 MG CAPS Take 300 mg by mouth daily.     [name] tiZANidine (ZANAFLEX) 4 MG tablet Take 4 mg by mouth at bedtime as needed for muscle spasms.     . traMADol (ULTRAM) 50 MG tablet Take 50 mg by mouth 2 (two) times daily.      No current facility-administered medications for this visit.     Allergies:   Gabapentin and Riluzole    ROS:  Please see the history of present illness.   Otherwise, review of systems are positive for none.   All other systems are reviewed and negative.    PHYSICAL EXAM: VS:  BP (!) 143/89   Pulse 87   Temp (!) 97.5 F (36.4 C)   Ht 5' 10.5" (1.791 m)   Wt 269 lb (122 kg)   SpO2 94% Comment: WITH AMBULATION  BMI 38.05 kg/m  , BMI Body mass index is 38.05 kg/m.  GENERAL:  Well appearing NECK:  No jugular venous distention, waveform within normal limits, carotid upstroke brisk and symmetric, no bruits, no thyromegaly LUNGS:  Clear to auscultation bilaterally CHEST:  Unremarkable HEART:  PMI not displaced or sustained,S1 and S2 within normal limits, no S3, no S4, no clicks, no rubs, no murmurs ABD:  Flat, positive bowel sounds normal in frequency in pitch, no bruits, no rebound, no guarding, no midline pulsatile mass, no hepatomegaly, no splenomegaly EXT:  2 plus pulses throughout, moderate to severe edema below the knees ,  no cyanosis no clubbing   EKG:  EKG is not ordered today.   Recent Labs: 05/25/2018: Hemoglobin 16.1; Platelets 196 12/03/2018: ALT 39; BUN 22; Creatinine, Ser 1.15; Potassium 4.4; Sodium 140    Lipid Panel    Component Value Date/Time   CHOL 154  12/03/2018 0934   TRIG 138.0 12/03/2018 0934   HDL 41.00 12/03/2018 0934   CHOLHDL 4 12/03/2018 0934   VLDL 27.6 12/03/2018 0934   LDLCALC 85 12/03/2018 0934   LDLDIRECT 81.0 11/20/2017 0857      Wt Readings from Last 3 Encounters:  12/14/18 269 lb (122 kg)  12/04/18 266 lb 4 oz (120.8 kg)  09/18/18 259 lb 12.8 oz (117.8 kg)      Other studies Reviewed: Additional studies/ records that were reviewed today included   Monitor. Review of the above records demonstrates:  See above.    ASSESSMENT AND PLAN:  EDEMA:  This is chronic.  I think it is related to decreased ambulation, having his feet down a good part of the day, salt and fluid and weight.  I do not think he has left-sided heart failure.  We talked about conservative therapies.   HTN:   The blood pressure is mildly elevated today but this is unusual.  No change in therapy.   AAA:  3.6 cm on CT in Feb of last year.  No further imaging at this time.  \  BRADYCARDIA:    He had no symptomatic bradycardia arrhythmia.  He had nonsustained VT but only one episode of this in 30 days.  He has had no further syncope.  No further work-up.  Current medicines are reviewed at length with the patient today.  The patient does not have concerns regarding medicines.  The following changes have been made:   None Labs/ tests ordered today include: None  No orders of the defined types were placed in this encounter.    Disposition:   FU with me as needed.   Signed, Minus Breeding, MD  12/14/2018 2:46 PM    [facility]

## 2018-12-14 ENCOUNTER — Other Ambulatory Visit: Payer: Self-pay

## 2018-12-14 ENCOUNTER — Encounter: Payer: Self-pay | Admitting: Cardiology

## 2018-12-14 ENCOUNTER — Ambulatory Visit (INDEPENDENT_AMBULATORY_CARE_PROVIDER_SITE_OTHER): Payer: Medicare Other | Admitting: Cardiology

## 2018-12-14 VITALS — BP 143/89 | HR 87 | Temp 97.5°F | Ht 70.5 in | Wt 269.0 lb

## 2018-12-14 DIAGNOSIS — M7989 Other specified soft tissue disorders: Secondary | ICD-10-CM | POA: Diagnosis not present

## 2018-12-14 DIAGNOSIS — R55 Syncope and collapse: Secondary | ICD-10-CM

## 2018-12-14 DIAGNOSIS — R0602 Shortness of breath: Secondary | ICD-10-CM | POA: Diagnosis not present

## 2018-12-14 NOTE — Patient Instructions (Signed)

## 2018-12-22 ENCOUNTER — Ambulatory Visit: Payer: Medicare Other | Admitting: Cardiology

## 2019-01-06 ENCOUNTER — Other Ambulatory Visit: Payer: Self-pay

## 2019-01-06 NOTE — Patient Outreach (Signed)
[facility]) Care Management  01/06/2019  Edward Frank Jul 05, 1945 [id number]   Medication Adherence call to [name] Hippa Identifiers Verify spoke with patients wife patient is showing past due on Losartan 50 mg and Pravastatin 20 mg. [name] explain patient is taking 1 tablet daily on both medications he has enough for ten days and will place an order from [facility].[name] is showing past due under [place].   [facility] Management Direct Dial [phone]  Fax [phone] [email or web address]

## 2019-01-13 ENCOUNTER — Other Ambulatory Visit: Payer: Self-pay | Admitting: Cardiology

## 2019-01-13 DIAGNOSIS — I4510 Unspecified right bundle-branch block: Secondary | ICD-10-CM

## 2019-02-01 DIAGNOSIS — G1223 Primary lateral sclerosis: Secondary | ICD-10-CM | POA: Diagnosis not present

## 2019-02-01 DIAGNOSIS — M7918 Myalgia, other site: Secondary | ICD-10-CM | POA: Diagnosis not present

## 2019-02-01 DIAGNOSIS — R252 Cramp and spasm: Secondary | ICD-10-CM | POA: Diagnosis not present

## 2019-02-01 DIAGNOSIS — G5622 Lesion of ulnar nerve, left upper limb: Secondary | ICD-10-CM | POA: Diagnosis not present

## 2019-03-23 DIAGNOSIS — R262 Difficulty in walking, not elsewhere classified: Secondary | ICD-10-CM | POA: Diagnosis not present

## 2019-03-23 DIAGNOSIS — M6281 Muscle weakness (generalized): Secondary | ICD-10-CM | POA: Diagnosis not present

## 2019-03-23 DIAGNOSIS — G1221 Amyotrophic lateral sclerosis: Secondary | ICD-10-CM | POA: Diagnosis not present

## 2019-03-27 ENCOUNTER — Telehealth: Payer: Self-pay | Admitting: Unknown Physician Specialty

## 2019-03-27 ENCOUNTER — Other Ambulatory Visit: Payer: Self-pay | Admitting: Unknown Physician Specialty

## 2019-03-27 DIAGNOSIS — U071 COVID-19: Secondary | ICD-10-CM

## 2019-03-27 NOTE — Telephone Encounter (Signed)
Discussed with patient about Covid symptoms and the use of bamlanivimab, a monoclonal antibody infusion for those with mild to moderate Covid symptoms and at a high risk of hospitalization.  Pt is qualified for this infusion at the [facility] infusion center due to Age > 40 and Hypertension which were addressed with the patient and are actively being managed by a [facility] provider.    After discussing the infusion's costs, potential benefits and side effects, the patient has decided to accept treatment with monoclonal antibodies.

## 2019-03-28 NOTE — Telephone Encounter (Signed)
Scheduled for 12/8 11AM

## 2019-03-29 ENCOUNTER — Encounter: Payer: Self-pay | Admitting: Family Medicine

## 2019-03-29 ENCOUNTER — Ambulatory Visit (INDEPENDENT_AMBULATORY_CARE_PROVIDER_SITE_OTHER): Payer: Medicare Other | Admitting: Family Medicine

## 2019-03-29 VITALS — Temp 99.8°F | Ht 70.5 in | Wt 273.0 lb

## 2019-03-29 DIAGNOSIS — R05 Cough: Secondary | ICD-10-CM | POA: Diagnosis not present

## 2019-03-29 DIAGNOSIS — R059 Cough, unspecified: Secondary | ICD-10-CM

## 2019-03-29 MED ORDER — HYDROCOD POLST-CPM POLST ER 10-8 MG/5ML PO SUER: 5.0000 mL | Freq: Two times a day (BID) | ORAL | 0 refills | Status: DC | PRN

## 2019-03-29 MED ORDER — AZITHROMYCIN 250 MG PO TABS: ORAL_TABLET | ORAL | 0 refills | Status: AC

## 2019-03-29 NOTE — Progress Notes (Signed)
[name], MD Primary Care and [place] at [facility] [place], [address] Phone: [phone]  FAX: [place] - 73 y.o. male  MRN [id number]  Date of Birth: [birth year]  Visit Date: 03/29/2019  PCP: [name], MD  Referred by: [name], MD  Virtual Visit via Telephone Note:  I connected with  Edward Frank on 03/29/2019 11:40 AM EST by telephone and verified that I am speaking with the correct person using two identifiers.   Location patient: home phone or cell phone Location provider: work or home office Consent: Verbal consent directly obtained from Edward Frank and that there may be a patient responsible charge related to this service. Persons participating in the virtual visit: patient, provider  I discussed the limitations of evaluation and management by telemedicine and the availability of in person appointments.  The patient expressed understanding and agreed to proceed.     History of Present Illness: I talked to the patient and his wife over the telephone.  He is having quite a significant cough.  He has also have ALS, but he denies having any sort of long-term pulmonary disease such as asthma and COPD.  He has had some low-grade fevers at 99.8 Fahrenheit.  He has had some nausea, but he denies any diarrhea.  Cough and not feeling well.  99.8 Nauseated.    Review of Systems: pertinent positives and pertinent negatives as per HPI No acute distress verbally  Past Medical History, Surgical History, Social History, Family History, Problem List, Medications, and Allergies have been reviewed and updated if relevant.   Observations/Objective/Exam:  An attempt was made to discern vital signs over the phone and per patient if applicable and possible.   Pulmonary:     Effort: Pulmonary effort is normal. No respiratory distress.  Neurological:     Mental Status: He  is alert and oriented to person, place, and time.  Psychiatric:        Thought Content: Thought content normal.        Judgment: Judgment normal.   Assessment and Plan:    ICD-10-CM   1. Cough  R05    >15 minutes spent in face to face time with patient, >50% spent in counselling or coordination of care   I am can have him quarantine for least 10 days, and they are already doing this to begin with.  I do not think that he has a reason to check with Covid test unless he significantly worsens.  He and his wife should be safe to quarantine at home unless worsening.  Given the cough and I can hear verbally I think it is probably reasonable in this elderly gentleman with ALS to give him some antibiotics.  At their request I am also going to send him in some Tussionex, but only do 2.5 mL per dose.  His wife understands this dosing.  I discussed the assessment and treatment plan with the patient. The patient was provided an opportunity to ask questions and all were answered. The patient agreed with the plan and demonstrated an understanding of the instructions.   The patient was advised to call back or seek an in-person evaluation if the symptoms worsen or if the condition fails to improve as anticipated.  Follow-up: prn unless noted otherwise below No follow-ups on file.  Meds ordered this encounter  Medications  . azithromycin (ZITHROMAX) 250 MG tablet  Sig: Take 2 tablets (500 mg total) by mouth daily for 1 day, THEN 1 tablet (250 mg total) daily for 4 days.    Dispense:  6 tablet    Refill:  0  . chlorpheniramine-HYDROcodone (TUSSIONEX PENNKINETIC ER) 10-8 MG/5ML SUER    Sig: Take 5 mLs by mouth every 12 (twelve) hours as needed for cough.    Dispense:  115 mL    Refill:  0   No orders of the defined types were placed in this encounter.   Signed,  Maud Deed. Cambry Spampinato, MD

## 2019-03-30 ENCOUNTER — Ambulatory Visit (HOSPITAL_COMMUNITY)
Admission: RE | Admit: 2019-03-30 | Discharge: 2019-03-30 | Disposition: A | Discharge status: Home / Self Care | Payer: Medicare Other | Source: Ambulatory Visit | Attending: Pulmonary Disease | Admitting: Pulmonary Disease

## 2019-03-30 DIAGNOSIS — U071 COVID-19: Secondary | ICD-10-CM | POA: Diagnosis not present

## 2019-03-30 DIAGNOSIS — Z23 Encounter for immunization: Secondary | ICD-10-CM | POA: Insufficient documentation

## 2019-03-30 MED ORDER — ALBUTEROL SULFATE HFA 108 (90 BASE) MCG/ACT IN AERS: 2.0000 | INHALATION_SPRAY | Freq: Once | RESPIRATORY_TRACT | Status: DC | PRN

## 2019-03-30 MED ORDER — FAMOTIDINE IN NACL 20-0.9 MG/50ML-% IV SOLN: 20.0000 mg | Freq: Once | INTRAVENOUS | Status: DC | PRN

## 2019-03-30 MED ORDER — DIPHENHYDRAMINE HCL 50 MG/ML IJ SOLN: 50.0000 mg | Freq: Once | INTRAMUSCULAR | Status: DC | PRN

## 2019-03-30 MED ORDER — EPINEPHRINE 0.3 MG/0.3ML IJ SOAJ: 0.3000 mg | Freq: Once | INTRAMUSCULAR | Status: DC | PRN

## 2019-03-30 MED ORDER — METHYLPREDNISOLONE SODIUM SUCC 125 MG IJ SOLR: 125.0000 mg | Freq: Once | INTRAMUSCULAR | Status: DC | PRN

## 2019-03-30 MED ORDER — SODIUM CHLORIDE 0.9 % IV SOLN
INTRAVENOUS | Status: DC | PRN
  Administered 2019-03-30: 250 mL via INTRAVENOUS

## 2019-03-30 MED ORDER — SODIUM CHLORIDE 0.9 % IV BOLUS: 1000.0000 mL | Freq: Once | INTRAVENOUS | Status: DC

## 2019-03-30 MED ORDER — SODIUM CHLORIDE 0.9 % IV BOLUS
1000.0000 mL | Freq: Once | INTRAVENOUS | Status: AC
  Administered 2019-03-30: 1000 mL via INTRAVENOUS

## 2019-03-30 MED ORDER — SODIUM CHLORIDE 0.9 % IV SOLN
700.0000 mg | Freq: Once | INTRAVENOUS | Status: AC
  Administered 2019-03-30: 700 mg via INTRAVENOUS
  Filled 2019-03-30: qty 20

## 2019-03-30 NOTE — Progress Notes (Signed)
  Diagnosis: COVID-19  Physician:  Dr. Joya Gaskins  Procedure: [facility] Med: bamlanivimab infusion - Provided patient with bamlanimivab fact sheet for patients, parents and caregivers prior to infusion.  Complications: No immediate complications noted.  Discharge: Discharged home   Edward Frank 03/30/2019

## 2019-03-30 NOTE — Progress Notes (Signed)
Diagnosis: COVID-19  Physician:  Dr. Joya Gaskins  Procedure: [facility] Med: bamlanivimab infusion - Provided patient with bamlanimivab fact sheet for patients, parents and caregivers prior to infusion.  Complications: No immediate complications noted.1 hour post procedure patient BP decreased to 91/67. Jena Gauss spoke with Kathrine Haddock, NP who gave verbal orders for 1L bolus and re-evaluation.   Discharge: Discharged home   Meda Klinefelter 03/30/2019

## 2019-04-06 ENCOUNTER — Other Ambulatory Visit: Payer: Self-pay | Admitting: Cardiology

## 2019-04-06 DIAGNOSIS — I4510 Unspecified right bundle-branch block: Secondary | ICD-10-CM

## 2019-04-06 NOTE — Telephone Encounter (Signed)
Rx has been sent to the pharmacy electronically. ° °

## 2019-04-21 ENCOUNTER — Encounter: Payer: Self-pay | Admitting: Internal Medicine

## 2019-04-28 DIAGNOSIS — Z471 Aftercare following joint replacement surgery: Secondary | ICD-10-CM | POA: Diagnosis not present

## 2019-04-28 DIAGNOSIS — Z96612 Presence of left artificial shoulder joint: Secondary | ICD-10-CM | POA: Diagnosis not present

## 2019-05-18 ENCOUNTER — Ambulatory Visit (INDEPENDENT_AMBULATORY_CARE_PROVIDER_SITE_OTHER): Payer: No Typology Code available for payment source | Admitting: Nurse Practitioner

## 2019-05-18 ENCOUNTER — Encounter: Payer: Self-pay | Admitting: Nurse Practitioner

## 2019-05-18 ENCOUNTER — Other Ambulatory Visit: Payer: Self-pay

## 2019-05-18 DIAGNOSIS — Z800 Family history of malignant neoplasm of digestive organs: Secondary | ICD-10-CM | POA: Diagnosis not present

## 2019-05-18 NOTE — Assessment & Plan Note (Signed)
Noted family history of colon cancer in his sister at age 76.  He has been receiving colonoscopies every 5 years.  He has been referred to [place] by the [facility] for colonoscopy on propofol due to meds and BMI.  Denies any overt GI complaints.  He did have COVID-19 in November 2020 received monoclonal antibodies and is since recovered.  He does note his left shoulder is artificial joint and would likely need a pillow specifically under his left shoulder (she states he will tell the nursing staff and endoscopy).  We will proceed with scheduling colonoscopy at this time.  Proceed with TCS on propofol/MAC with Dr. Gala Romney in near future: the risks, benefits, and alternatives have been discussed with the patient in detail. The patient states understanding and desires to proceed.  The patient is currently on Ultram, Robaxin.  Consumes rare alcohol, denies recreational drugs.  His BMI is currently 38.  No other anticoagulants, anxiolytics, chronic pain medications, antidepressants, iron supplements, or diabetes medications.  We will proceed with a colonoscopy on propofol/MAC to promote adequate sedation.

## 2019-05-18 NOTE — Patient Instructions (Signed)
Your health issues we discussed today were:   Family history of colon cancer due for colonoscopy: 1. We will schedule your colonoscopy for you 2. Further recommendations will follow your colonoscopy 3. Call us if you have any concerning symptoms such as rectal bleeding, worsening abdominal pain  Overall I recommend:  1. Continue your other current medications 2. Return for follow-up as recommended after colonoscopy 3. Call us if you have any questions or concerns   ---------------------------------------------------------------  COVID-19 Vaccine Information can be found at: ShippingScam.co.uk For questions related to vaccine distribution or appointments, please email vaccine@[email or web address] [email or web address] or call [phone].   ---------------------------------------------------------------  At [facility] we value your feedback. You may receive a survey about your visit today. Please share your experience as we strive to create trusting relationships with our patients to provide genuine, compassionate, quality care.  We appreciate your understanding and patience as we review any laboratory studies, imaging, and other diagnostic tests that are ordered as we care for you. Our office policy is 5 business days for review of these results, and any emergent or urgent results are addressed in a timely manner for your best interest. If you do not hear from our office in 1 week, please contact us.   We also encourage the use of MyChart, which contains your medical information for your review as well. If you are not enrolled in this feature, an access code is on this after visit summary for your convenience. Thank you for allowing [place] to be involved in your care.  It was great to see you today!  I hope you have a great day!!

## 2019-05-18 NOTE — Progress Notes (Signed)
Primary Care Physician:  [name], MD Primary Gastroenterologist:  Dr. Gala Romney  Chief Complaint  Patient presents with  . Colonoscopy    HPI:   Edward Frank is a 74 y.o. male who presents on referral from the [facility] to schedule colonoscopy.  Nurse/phone triage was deferred office visit due to mesenteric likely necessitating augmented sedation.  Reviewed information provided with referral including referral authorization for EGD and/or colonoscopy.  Diagnosis was for screening for malignant neoplasm of the colon.  Noted first-degree relatives with colon cancer.  No previously documented issues with sedation.  Last colonoscopy June 2015.  Noted history of GERD.  Last colonoscopy dated 10/12/2013 at [place] for screening history high risk CRC due to sister diagnosed at age 58.  Findings included diverticulosis, otherwise normal.  Recommended repeat in 5 years (2020).  Today he states he's doing well overall. Denies abdominal pain, N/V, hematochezia, melena, fever, chills, unintentional weight loss. Denies URI or flu-like symptoms. Denies loss of sense of taste or smell. Was COVID-19+ in November 2020 and had monoclonal antibodies (as well as his wife). This kept him out of the hospital. Denies chest pain, dyspnea, dizziness, lightheadedness, syncope, near syncope. Denies any other upper or lower GI symptoms.  Past Medical History:  Diagnosis Date  . Arthritis   . Bradycardia   . Fatty liver   . Fracture, tibia, with fibula teenage yrs.    no surgery, wore cast  . GERD (gastroesophageal reflux disease)   . Hyperlipidemia   . Hypertension   . 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 OSA (obstructive sleep apnea) 01/22/2011  . PONV (postoperative nausea and vomiting)   . 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 Right bundle branch block   . Syncope     Past Surgical History:  Procedure  Laterality Date  . CHOLECYSTECTOMY N/A 06/13/2016   Procedure: LAPAROSCOPIC CHOLECYSTECTOMY;  Surgeon: Arta Bruce Kinsinger, MD;  Location: [facility];  Service: General;  Laterality: N/A;  . EYE SURGERY     Cataracts bil  . FOOT SURGERY  11-2008   hammer toe and bunion  . HERNIA REPAIR  12-2001   inguinal hernia bilateral  . REVERSE SHOULDER ARTHROPLASTY Left 05/28/2018   Procedure: REVERSE SHOULDER ARTHROPLASTY;  Surgeon: Justice Britain, MD;  Location: [facility];  Service: Orthopedics;  Laterality: Left;  180min  . SHOULDER ARTHROSCOPY WITH ROTATOR CUFF REPAIR AND SUBACROMIAL DECOMPRESSION Right 06/18/2012   Procedure: RIGHT SHOULDER ARTHROSCOPY WITH SUBACROMIAL DECOMPRESSION AND DISTAL CLAVICLE RESECTION AND ROTATOR CUFF REPAIR;  Surgeon: Marin Shutter, MD;  Location: [facility];  Service: Orthopedics;  Laterality: Right;  . SHOULDER SURGERY  1977   Luxating     Current Outpatient Medications  Medication Sig Dispense Refill  . aspirin EC 81 MG tablet Take 81 mg by mouth daily.    . chlorthalidone (HYGROTON) 50 MG tablet Take 1 tablet (50 mg total) by mouth daily. 90 tablet 3  . Coenzyme Q10 (COQ10) 400 MG CAPS Take 400 mg by mouth daily.    [name] CRANBERRY PO Take 1 capsule by mouth 2 (two) times daily. Takes 200 mg    . Cyanocobalamin (VITAMIN B-12) 2500 MCG SUBL Place 2,500 mcg under the tongue daily.    [name] desonide (DESOWEN) 0.05 % cream Apply 1 application topically 2 (two) times daily as needed.    . hydrocortisone (ANUSOL-HC) 2.5 % rectal cream Place  1 application rectally 2 (two) times daily as needed for hemorrhoids or anal itching.    . indomethacin (INDOCIN) 25 MG capsule Take 25 mg by mouth daily.     [name] losartan (COZAAR) 50 MG tablet Take 1 tablet by mouth once daily 90 tablet 3  . methocarbamol (ROBAXIN) 750 MG tablet Take 750 mg by mouth 2 (two) times daily.     . Multiple Vitamin (MULTIVITAMIN WITH MINERALS) TABS Take 1 tablet by mouth daily.    . Omega-3 Fatty Acids (FISH OIL TRIPLE  STRENGTH) 1400 MG CAPS Take 2,800 mg by mouth 2 (two) times daily.     [name] omeprazole (PRILOSEC) 20 MG capsule Take 40 mg by mouth daily.     [name] OVER THE COUNTER MEDICATION Take 1 tablet by mouth daily. Calcium citrate+D3 500-800 mg    . pravastatin (PRAVACHOL) 20 MG tablet TAKE 1 TABLET BY MOUTH AT BEDTIME 90 tablet 3  . RESVERATROL 100 MG CAPS Take 100 mg by mouth daily.     . traMADol (ULTRAM) 50 MG tablet Take 50 mg by mouth 2 (two) times daily.      No current facility-administered medications for this visit.    Allergies as of 05/18/2019 - Review Complete 05/18/2019  Allergen Reaction Noted  . Gabapentin Swelling 11/14/2015  . Riluzole Other (See Comments) 11/14/2015    Family History  Problem Relation Age of Onset  . Heart disease Mother        ? afib and got pacemaker  . Uterine cancer Mother   . Prostate cancer Father   . Hypertension Father   . Kidney failure Father   . Stroke Father   . Colon cancer Sister 60    Social History   Socioeconomic History  . Marital status: Married    Spouse name: Not on file  . Number of children: Not on file  . Years of education: Not on file  . Highest education level: Not on file  Occupational History  . Occupation: Retired     Fish farm manager: [employer]  Tobacco Use  . Smoking status: Never Smoker  . Smokeless tobacco: Never Used  Substance and Sexual Activity  . Alcohol use: Yes    Alcohol/week: 0.0 standard drinks    Comment: rarely  . Drug use: No  . Sexual activity: Yes  Other Topics Concern  . Not on file  Social History Narrative   Minimal exercise: due to PLS.   Veterinarian   Healthy diet.   Full Code.   No living will, no HCPOA. (reviewed 2014)         Social Determinants of Health   Financial Resource Strain:   . Difficulty of Paying Living Expenses: Not on file  Food Insecurity:   . Worried About Charity fundraiser in the Last Year: Not on file  . Ran Out of Food in the Last Year: Not on file    Transportation Needs:   . Lack of Transportation (Medical): Not on file  . Lack of Transportation (Non-Medical): Not on file  Physical Activity:   . Days of Exercise per Week: Not on file  . Minutes of Exercise per Session: Not on file  Stress:   . Feeling of Stress : Not on file  Social Connections:   . Frequency of Communication with Friends and Family: Not on file  . Frequency of Social Gatherings with Friends and Family: Not on file  . Attends Religious Services: Not on file  . Active Member  of Clubs or Organizations: Not on file  . Attends Archivist Meetings: Not on file  . Marital Status: Not on file  Intimate Partner Violence:   . Fear of Current or Ex-Partner: Not on file  . Emotionally Abused: Not on file  . Physically Abused: Not on file  . Sexually Abused: Not on file    Review of Systems: General: Negative for anorexia, weight loss, fever, chills, fatigue, weakness. ENT: Negative for hoarseness, difficulty swallowing. CV: Negative for chest pain, angina, palpitations, peripheral edema.  Respiratory: Negative for dyspnea at rest, cough, sputum, wheezing.  GI: See history of present illness. MS: Notes artificial joint left shoulder.  Derm: Negative for rash or itching.  Endo: Negative for unusual weight change.  Heme: Negative for bruising or bleeding. Allergy: Negative for rash or hives.    Physical Exam: BP (!) 148/92   Pulse 96   Temp (!) 97.1 F (36.2 C)   Ht 5\' 10"  (1.778 m)   Wt 266 lb (120.7 kg)   BMI 38.17 kg/m  General:   Alert and oriented. Pleasant and cooperative. Well-nourished and well-developed.  Head:  Normocephalic and atraumatic. Eyes:  Without icterus, sclera clear and conjunctiva pink.  Ears:  Normal auditory acuity. Cardiovascular:  S1, S2 present without murmurs appreciated. Extremities without clubbing or edema. Respiratory:  Clear to auscultation bilaterally. No wheezes, rales, or rhonchi. No distress.   Gastrointestinal:  +BS, soft, non-tender and non-distended. No HSM noted. No guarding or rebound. No masses appreciated.  Rectal:  Deferred  Musculoskalatal:  Symmetrical without gross deformities. Neurologic:  Alert and oriented x4;  grossly normal neurologically. Psych:  Alert and cooperative. Normal mood and affect. Heme/Lymph/Immune: No excessive bruising noted.    05/18/2019 4:14 PM   Disclaimer: This note was dictated with voice recognition software. Similar sounding words can inadvertently be transcribed and may not be corrected upon review.

## 2019-05-24 ENCOUNTER — Telehealth: Payer: Self-pay

## 2019-05-24 ENCOUNTER — Other Ambulatory Visit: Payer: Self-pay

## 2019-05-24 MED ORDER — PEG 3350-KCL-NA BICARB-NACL 420 G PO SOLR: 4000.0000 mL | ORAL | 0 refills | Status: DC

## 2019-05-24 NOTE — Telephone Encounter (Signed)
Pre-op and COVID test scheduled for 07/30/19. Appt letter mailed with procedure instructions.

## 2019-05-24 NOTE — Telephone Encounter (Signed)
Called and spoke to wife this morning, TCS w/Prop w/RMR scheduled for 08/02/19 at 10:00am. Rx for prep sent to pharmacy. Orders entered.

## 2019-07-29 ENCOUNTER — Encounter (HOSPITAL_COMMUNITY)
Admission: RE | Admit: 2019-07-29 | Discharge: 2019-07-29 | Disposition: A | Discharge status: Home / Self Care | Payer: Non-veteran care | Source: Ambulatory Visit | Attending: Internal Medicine | Admitting: Internal Medicine

## 2019-07-29 ENCOUNTER — Other Ambulatory Visit: Payer: Self-pay

## 2019-07-29 ENCOUNTER — Other Ambulatory Visit (HOSPITAL_COMMUNITY)
Admission: RE | Admit: 2019-07-29 | Discharge: 2019-07-29 | Disposition: A | Discharge status: Home / Self Care | Payer: No Typology Code available for payment source | Source: Ambulatory Visit | Attending: Internal Medicine | Admitting: Internal Medicine

## 2019-07-29 ENCOUNTER — Encounter (HOSPITAL_COMMUNITY): Payer: Self-pay

## 2019-07-29 DIAGNOSIS — Z20822 Contact with and (suspected) exposure to covid-19: Secondary | ICD-10-CM | POA: Diagnosis present

## 2019-07-29 NOTE — Patient Instructions (Signed)
Edward Frank  07/29/2019     @PREFPERIOPPHARMACY @   Your procedure is scheduled on  08/02/2019 .  Report to [facility] at  0800  A.M.  Call this number if you have problems the morning of surgery:  [phone]   Remember:  Follow the diet and prep instructions given to you by Dr Roseanne Kaufman office.                      Take these medicines the morning of surgery with A SIP OF WATER  Allopurinol, hygroton, indomethicin, losartan, robaxin, prilosec, tramadol.    Do not wear jewelry, make-up or nail polish.  Do not wear lotions, powders, or perfumes. Please wear deodorant and brush your teeth.  Do not shave 48 hours prior to surgery.  Men may shave face and neck.  Do not bring valuables to the hospital.  [facility] is not responsible for any belongings or valuables.  Contacts, dentures or bridgework may not be worn into surgery.  Leave your suitcase in the car.  After surgery it may be brought to your room.  For patients admitted to the hospital, discharge time will be determined by your treatment team.  Patients discharged the day of surgery will not be allowed to drive home.   Name and phone number of your driver:   family Special instructions:  DO NOT smoke the morning of your procedure.  Please read over the following fact sheets that you were given. Anesthesia Post-op Instructions and Care and Recovery After Surgery       Colonoscopy, Adult, Care After This sheet gives you information about how to care for yourself after your procedure. Your health care provider may also give you more specific instructions. If you have problems or questions, contact your health care provider. What can I expect after the procedure? After the procedure, it is common to have:  A small amount of blood in your stool for 24 hours after the procedure.  Some gas.  Mild cramping or bloating of your abdomen. Follow these instructions at home: Eating and drinking   Drink enough  fluid to keep your urine pale yellow.  Follow instructions from your health care provider about eating or drinking restrictions.  Resume your normal diet as instructed by your health care provider. Avoid heavy or fried foods that are hard to digest. Activity  Rest as told by your health care provider.  Avoid sitting for a long time without moving. Get up to take short walks every 1-2 hours. This is important to improve blood flow and breathing. Ask for help if you feel weak or unsteady.  Return to your normal activities as told by your health care provider. Ask your health care provider what activities are safe for you. Managing cramping and bloating   Try walking around when you have cramps or feel bloated.  Apply heat to your abdomen as told by your health care provider. Use the heat source that your health care provider recommends, such as a moist heat pack or a heating pad. ? Place a towel between your skin and the heat source. ? Leave the heat on for 20-30 minutes. ? Remove the heat if your skin turns bright red. This is especially important if you are unable to feel pain, heat, or cold. You may have a greater risk of getting burned. General instructions  For the first 24 hours after the procedure: ? Do not drive or  use machinery. ? Do not sign important documents. ? Do not drink alcohol. ? Do your regular daily activities at a slower pace than normal. ? Eat soft foods that are easy to digest.  Take over-the-counter and prescription medicines only as told by your health care provider.  Keep all follow-up visits as told by your health care provider. This is important. Contact a health care provider if:  You have blood in your stool 2-3 days after the procedure. Get help right away if you have:  More than a small spotting of blood in your stool.  Large blood clots in your stool.  Swelling of your abdomen.  Nausea or vomiting.  A fever.  Increasing pain in your  abdomen that is not relieved with medicine. Summary  After the procedure, it is common to have a small amount of blood in your stool. You may also have mild cramping and bloating of your abdomen.  For the first 24 hours after the procedure, do not drive or use machinery, sign important documents, or drink alcohol.  Get help right away if you have a lot of blood in your stool, nausea or vomiting, a fever, or increased pain in your abdomen. This information is not intended to replace advice given to you by your health care provider. Make sure you discuss any questions you have with your health care provider. Document Revised: 11/02/2018 Document Reviewed: 11/02/2018 Elsevier Patient Education  [place] After These instructions provide you with information about caring for yourself after your procedure. Your health care provider may also give you more specific instructions. Your treatment has been planned according to current medical practices, but problems sometimes occur. Call your health care provider if you have any problems or questions after your procedure. What can I expect after the procedure? After your procedure, you may:  Feel sleepy for several hours.  Feel clumsy and have poor balance for several hours.  Feel forgetful about what happened after the procedure.  Have poor judgment for several hours.  Feel nauseous or vomit.  Have a sore throat if you had a breathing tube during the procedure. Follow these instructions at home: For at least 24 hours after the procedure:      Have a responsible adult stay with you. It is important to have someone help care for you until you are awake and alert.  Rest as needed.  Do not: ? Participate in activities in which you could fall or become injured. ? Drive. ? Use heavy machinery. ? Drink alcohol. ? Take sleeping pills or medicines that cause drowsiness. ? Make important decisions or  sign legal documents. ? Take care of children on your own. Eating and drinking  Follow the diet that is recommended by your health care provider.  If you vomit, drink water, juice, or soup when you can drink without vomiting.  Make sure you have little or no nausea before eating solid foods. General instructions  Take over-the-counter and prescription medicines only as told by your health care provider.  If you have sleep apnea, surgery and certain medicines can increase your risk for breathing problems. Follow instructions from your health care provider about wearing your sleep device: ? Anytime you are sleeping, including during daytime naps. ? While taking prescription pain medicines, sleeping medicines, or medicines that make you drowsy.  If you smoke, do not smoke without supervision.  Keep all follow-up visits as told by your health care provider. This is important.  Contact a health care provider if:  You keep feeling nauseous or you keep vomiting.  You feel light-headed.  You develop a rash.  You have a fever. Get help right away if:  You have trouble breathing. Summary  For several hours after your procedure, you may feel sleepy and have poor judgment.  Have a responsible adult stay with you for at least 24 hours or until you are awake and alert. This information is not intended to replace advice given to you by your health care provider. Make sure you discuss any questions you have with your health care provider. Document Revised: 07/07/2017 Document Reviewed: 07/30/2015 Elsevier Patient Education  [place].

## 2019-07-30 ENCOUNTER — Other Ambulatory Visit (HOSPITAL_COMMUNITY): Payer: Non-veteran care

## 2019-07-30 LAB — SARS CORONAVIRUS 2 (TAT 6-24 HRS): SARS Coronavirus 2: NEGATIVE

## 2019-08-02 ENCOUNTER — Encounter (HOSPITAL_COMMUNITY): Payer: Self-pay | Admitting: Internal Medicine

## 2019-08-02 ENCOUNTER — Encounter (HOSPITAL_COMMUNITY)
Admission: RE | Disposition: A | Discharge status: Home / Self Care | Payer: Self-pay | Source: Home / Self Care | Attending: Internal Medicine

## 2019-08-02 ENCOUNTER — Ambulatory Visit (HOSPITAL_COMMUNITY): Payer: No Typology Code available for payment source | Admitting: Anesthesiology

## 2019-08-02 ENCOUNTER — Ambulatory Visit (HOSPITAL_COMMUNITY)
Admission: RE | Admit: 2019-08-02 | Discharge: 2019-08-02 | Disposition: A | Discharge status: Home / Self Care | Payer: No Typology Code available for payment source | Attending: Internal Medicine | Admitting: Internal Medicine

## 2019-08-02 DIAGNOSIS — M1990 Unspecified osteoarthritis, unspecified site: Secondary | ICD-10-CM | POA: Insufficient documentation

## 2019-08-02 DIAGNOSIS — I10 Essential (primary) hypertension: Secondary | ICD-10-CM | POA: Insufficient documentation

## 2019-08-02 DIAGNOSIS — Z888 Allergy status to other drugs, medicaments and biological substances status: Secondary | ICD-10-CM | POA: Insufficient documentation

## 2019-08-02 DIAGNOSIS — E785 Hyperlipidemia, unspecified: Secondary | ICD-10-CM | POA: Insufficient documentation

## 2019-08-02 DIAGNOSIS — I739 Peripheral vascular disease, unspecified: Secondary | ICD-10-CM | POA: Insufficient documentation

## 2019-08-02 DIAGNOSIS — G4733 Obstructive sleep apnea (adult) (pediatric): Secondary | ICD-10-CM | POA: Insufficient documentation

## 2019-08-02 DIAGNOSIS — K5730 Diverticulosis of large intestine without perforation or abscess without bleeding: Secondary | ICD-10-CM | POA: Insufficient documentation

## 2019-08-02 DIAGNOSIS — Z79899 Other long term (current) drug therapy: Secondary | ICD-10-CM | POA: Insufficient documentation

## 2019-08-02 DIAGNOSIS — Z791 Long term (current) use of non-steroidal anti-inflammatories (NSAID): Secondary | ICD-10-CM | POA: Insufficient documentation

## 2019-08-02 DIAGNOSIS — D120 Benign neoplasm of cecum: Secondary | ICD-10-CM | POA: Diagnosis not present

## 2019-08-02 DIAGNOSIS — Z1211 Encounter for screening for malignant neoplasm of colon: Secondary | ICD-10-CM | POA: Diagnosis present

## 2019-08-02 DIAGNOSIS — Z800 Family history of malignant neoplasm of digestive organs: Secondary | ICD-10-CM | POA: Diagnosis not present

## 2019-08-02 DIAGNOSIS — G1223 Primary lateral sclerosis: Secondary | ICD-10-CM | POA: Diagnosis not present

## 2019-08-02 DIAGNOSIS — K760 Fatty (change of) liver, not elsewhere classified: Secondary | ICD-10-CM | POA: Insufficient documentation

## 2019-08-02 DIAGNOSIS — K219 Gastro-esophageal reflux disease without esophagitis: Secondary | ICD-10-CM | POA: Diagnosis not present

## 2019-08-02 HISTORY — PX: COLONOSCOPY WITH PROPOFOL: SHX5780

## 2019-08-02 HISTORY — PX: POLYPECTOMY: SHX5525

## 2019-08-02 SURGERY — COLONOSCOPY WITH PROPOFOL
Anesthesia: General

## 2019-08-02 MED ORDER — CHLORHEXIDINE GLUCONATE CLOTH 2 % EX PADS: 6.0000 | MEDICATED_PAD | Freq: Once | CUTANEOUS | Status: DC

## 2019-08-02 MED ORDER — KETAMINE HCL 50 MG/5ML IJ SOSY
PREFILLED_SYRINGE | INTRAMUSCULAR | Status: AC
  Filled 2019-08-02: qty 5

## 2019-08-02 MED ORDER — LACTATED RINGERS IV SOLN: INTRAVENOUS | Status: DC | PRN

## 2019-08-02 MED ORDER — PROPOFOL 500 MG/50ML IV EMUL
INTRAVENOUS | Status: DC | PRN
  Administered 2019-08-02: 125 ug/kg/min via INTRAVENOUS

## 2019-08-02 MED ORDER — LIDOCAINE HCL (CARDIAC) PF 100 MG/5ML IV SOSY
PREFILLED_SYRINGE | INTRAVENOUS | Status: DC | PRN
  Administered 2019-08-02: 50 mg via INTRATRACHEAL

## 2019-08-02 MED ORDER — LACTATED RINGERS IV SOLN: Freq: Once | INTRAVENOUS | Status: AC

## 2019-08-02 NOTE — Transfer of Care (Signed)
Immediate Anesthesia Transfer of Care Note  Patient: Edward Frank  Procedure(s) Performed: COLONOSCOPY WITH PROPOFOL (N/A ) POLYPECTOMY  Patient Location: PACU  Anesthesia Type:General  Level of Consciousness: awake  Airway & Oxygen Therapy: Patient Spontanous Breathing and Patient connected to face mask oxygen  Post-op Assessment: Report given to RN and Post -op Vital signs reviewed and stable  Post vital signs: Reviewed and stable  Last Vitals:  Vitals Value Taken Time  BP    Temp    Pulse 70 08/02/19 1001  Resp    SpO2 88 % 08/02/19 1001  Vitals shown include unvalidated device data.  Last Pain:  Vitals:   08/02/19 0930  TempSrc:   PainSc: 3       Patients Stated Pain Goal: 6 ([id number])  Complications: No apparent anesthesia complications

## 2019-08-02 NOTE — H&P (Signed)
@LOGO @   Primary Care Physician:  [name], MD Primary Gastroenterologist:  Dr. Gala Romney  Pre-Procedure History & Physical: HPI:  Edward Frank is a 74 y.o. male is here for a screening colonoscopy.  Last colonoscopy 2015.  Sister with colon cancer.  No bowel symptoms currently.  Here for high rescreening colonoscopy.  Past Medical History:  Diagnosis Date  . Arthritis   . Bradycardia   . Fatty liver   . Fracture, tibia, with fibula teenage yrs.    no surgery, wore cast  . GERD (gastroesophageal reflux disease)   . Hyperlipidemia   . Hypertension   . 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 OSA (obstructive sleep apnea) 01/22/2011  . PONV (postoperative nausea and vomiting)   . 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 Right bundle branch block   . Syncope     Past Surgical History:  Procedure Laterality Date  . CHOLECYSTECTOMY N/A 06/13/2016   Procedure: LAPAROSCOPIC CHOLECYSTECTOMY;  Surgeon: Arta Bruce Kinsinger, MD;  Location: [facility];  Service: General;  Laterality: N/A;  . EYE SURGERY     Cataracts bil  . FOOT SURGERY  11-2008   hammer toe and bunion  . HERNIA REPAIR  12-2001   inguinal hernia bilateral  . REVERSE SHOULDER ARTHROPLASTY Left 05/28/2018   Procedure: REVERSE SHOULDER ARTHROPLASTY;  Surgeon: Justice Britain, MD;  Location: [facility];  Service: Orthopedics;  Laterality: Left;  161min  . SHOULDER ARTHROSCOPY WITH ROTATOR CUFF REPAIR AND SUBACROMIAL DECOMPRESSION Right 06/18/2012   Procedure: RIGHT SHOULDER ARTHROSCOPY WITH SUBACROMIAL DECOMPRESSION AND DISTAL CLAVICLE RESECTION AND ROTATOR CUFF REPAIR;  Surgeon: Marin Shutter, MD;  Location: [place];  Service: Orthopedics;  Laterality: Right;  . SHOULDER SURGERY  1977   Luxating     Prior to Admission medications   Medication Sig Start Date End Date Taking? Authorizing Provider  allopurinol (ZYLOPRIM) 100 MG tablet Take 100 mg by mouth daily.   Yes [provider]  aspirin EC 81 MG tablet Take 81 mg by mouth daily.   Yes [provider]  chlorthalidone (HYGROTON) 50 MG tablet Take 1 tablet (50 mg total) by mouth daily. 04/06/19  Yes Minus Breeding, MD  Coenzyme Q10 (COQ10) 400 MG CAPS Take 400 mg by mouth daily.   Yes [provider]  Cranberry 500 MG CAPS Take 500 mg by mouth 2 (two) times daily.    Yes [provider]  desonide (DESOWEN) 0.05 % cream Apply 1 application topically 2 (two) times daily as needed (irritation).    Yes [provider]  hydrocortisone (ANUSOL-HC) 2.5 % rectal cream Place 1 application rectally 2 (two) times daily as needed for hemorrhoids or anal itching.   Yes [provider]  indomethacin (INDOCIN) 25 MG capsule Take 25 mg by mouth daily.    Yes [provider]  losartan (COZAAR) 50 MG tablet Take 1 tablet by mouth once daily Patient taking differently: Take 50 mg by mouth daily.  04/06/19  Yes Minus Breeding, MD  methocarbamol (ROBAXIN) 750 MG tablet Take 750 mg by mouth 2 (two) times daily.    Yes [provider]  Omega-3 Fatty Acids (FISH OIL TRIPLE STRENGTH) 1400 MG CAPS Take 2,800 mg by mouth 2 (two) times daily.    Yes [provider]  omeprazole (PRILOSEC) 20 MG capsule Take 40 mg by mouth daily.    Yes [provider]  OVER THE COUNTER MEDICATION  Take 1 tablet by mouth daily. Calcium citrate+D3 500-800 mg   Yes [provider]  polyethylene glycol-electrolytes (TRILYTE) 420 g solution Take 4,000 mLs by mouth as directed. 05/24/19  Yes Imari Reen, Cristopher Estimable, MD  pravastatin (PRAVACHOL) 20 MG tablet TAKE 1 TABLET BY MOUTH AT BEDTIME Patient taking differently: Take 20 mg by mouth at bedtime.  04/06/19  Yes Minus Breeding, MD  RESVERATROL 100 MG CAPS Take 100 mg by mouth daily.    Yes [provider]  traMADol (ULTRAM) 50 MG tablet Take 50 mg by mouth 2 (two) times daily.    Yes [provider]   Cyanocobalamin (VITAMIN B-12) 2500 MCG SUBL Place 2,500 mcg under the tongue daily.    [provider]  Multiple Vitamin (MULTIVITAMIN WITH MINERALS) TABS Take 1 tablet by mouth daily.    [provider]    Allergies as of 05/24/2019 - Review Complete 05/18/2019  Allergen Reaction Noted  . Gabapentin Swelling 11/14/2015  . Riluzole Other (See Comments) 11/14/2015    Family History  Problem Relation Age of Onset  . Heart disease Mother        ? afib and got pacemaker  . Uterine cancer Mother   . Prostate cancer Father   . Hypertension Father   . Kidney failure Father   . Stroke Father   . Colon cancer Sister 32    Social History   Socioeconomic History  . Marital status: Married    Spouse name: Not on file  . Number of children: Not on file  . Years of education: Not on file  . Highest education level: Not on file  Occupational History  . Occupation: Retired     Fish farm manager: [employer]  Tobacco Use  . Smoking status: Never Smoker  . Smokeless tobacco: Never Used  Substance and Sexual Activity  . Alcohol use: Yes    Alcohol/week: 0.0 standard drinks    Comment: rarely  . Drug use: No  . Sexual activity: Yes  Other Topics Concern  . Not on file  Social History Narrative   Minimal exercise: due to PLS.   Veterinarian   Healthy diet.   Full Code.   No living will, no HCPOA. (reviewed 2014)         Social Determinants of Health   Financial Resource Strain:   . Difficulty of Paying Living Expenses:   Food Insecurity:   . Worried About Charity fundraiser in the Last Year:   . Arboriculturist in the Last Year:   Transportation Needs:   . Film/video editor (Medical):   [name] Lack of Transportation (Non-Medical):   Physical Activity:   . Days of Exercise per Week:   . Minutes of Exercise per Session:   Stress:   . Feeling of Stress :   Social Connections:   . Frequency of Communication with Friends and Family:   . Frequency of Social  Gatherings with Friends and Family:   . Attends Religious Services:   . Active Member of Clubs or Organizations:   . Attends Archivist Meetings:   [name] Marital Status:   Intimate Partner Violence:   . Fear of Current or Ex-Partner:   . Emotionally Abused:   [name] Physically Abused:   . Sexually Abused:     Review of Systems: See HPI, otherwise negative ROS  Physical Exam: BP 131/87   Pulse 99   Temp 98.5 F (36.9 C) (Oral)   Resp (!) 31  SpO2 96%  General:   Alert,  Well-developed, well-nourished, pleasant and cooperative in NAD Lungs:  Clear throughout to auscultation.   No wheezes, crackles, or rhonchi. No acute distress. Heart:  Regular rate and rhythm; no murmurs, clicks, rubs,  or gallops. Abdomen:  Soft, nontender and nondistended. No masses, hepatosplenomegaly or hernias noted. Normal bowel sounds, without guarding, and without rebound.   M Impression/Plan: Edward Frank is now here to undergo a screening colonoscopy.  Risks, benefits, limitations, imponderables and alternatives regarding colonoscopy have been reviewed with the patient. Questions have been answered. All parties agreeable.     Notice:  This dictation was prepared with Dragon dictation along with smaller phrase technology. Any transcriptional errors that result from this process are unintentional and may not be corrected upon review.

## 2019-08-02 NOTE — Discharge Instructions (Signed)
Colon Polyps  Polyps are tissue growths inside the body. Polyps can grow in many places, including the large intestine (colon). A polyp may be a round bump or a mushroom-shaped growth. You could have one polyp or several. Most colon polyps are noncancerous (benign). However, some colon polyps can become cancerous over time. Finding and removing the polyps early can help prevent this. What are the causes? The exact cause of colon polyps is not known. What increases the risk? You are more likely to develop this condition if you:  Have a family history of colon cancer or colon polyps.  Are older than 55 or older than 45 if you are African American.  Have inflammatory bowel disease, such as ulcerative colitis or Crohn's disease.  Have certain hereditary conditions, such as: ? Familial adenomatous polyposis. ? Lynch syndrome. ? Turcot syndrome. ? Peutz-Jeghers syndrome.  Are overweight.  Smoke cigarettes.  Do not get enough exercise.  Drink too much alcohol.  Eat a diet that is high in fat and red meat and low in fiber.  Had childhood cancer that was treated with abdominal radiation. What are the signs or symptoms? Most polyps do not cause symptoms. If you have symptoms, they may include:  Blood coming from your rectum when having a bowel movement.  Blood in your stool. The stool may look dark red or black.  Abdominal pain.  A change in bowel habits, such as constipation or diarrhea. How is this diagnosed? This condition is diagnosed with a colonoscopy. This is a procedure in which a lighted, flexible scope is inserted into the anus and then passed into the colon to examine the area. Polyps are sometimes found when a colonoscopy is done as part of routine cancer screening tests. How is this treated? Treatment for this condition involves removing any polyps that are found. Most polyps can be removed during a colonoscopy. Those polyps will then be tested for cancer. Additional  treatment may be needed depending on the results of testing. Follow these instructions at home: Lifestyle  Maintain a healthy weight, or lose weight if recommended by your health care provider.  Exercise every day or as told by your health care provider.  Do not use any products that contain nicotine or tobacco, such as cigarettes and e-cigarettes. If you need help quitting, ask your health care provider.  If you drink alcohol, limit how much you have: ? 0-1 drink a day for women. ? 0-2 drinks a day for men.  Be aware of how much alcohol is in your drink. In the [place] one drink equals one 12 oz bottle of beer (355 mL), one 5 oz glass of wine (148 mL), or one 1 oz shot of hard liquor (44 mL). Eating and drinking   Eat foods that are high in fiber, such as fruits, vegetables, and whole grains.  Eat foods that are high in calcium and vitamin D, such as milk, cheese, yogurt, eggs, liver, fish, and broccoli.  Limit foods that are high in fat, such as fried foods and desserts.  Limit the amount of red meat and processed meat you eat, such as hot dogs, sausage, bacon, and lunch meats. General instructions  Keep all follow-up visits as told by your health care provider. This is important. ? This includes having regularly scheduled colonoscopies. ? Talk to your health care provider about when you need a colonoscopy. Contact a health care provider if:  You have new or worsening bleeding during a bowel movement.  You  have new or increased blood in your stool.  You have a change in bowel habits.  You lose weight for no known reason. Summary  Polyps are tissue growths inside the body. Polyps can grow in many places, including the colon.  Most colon polyps are noncancerous (benign), but some can become cancerous over time.  This condition is diagnosed with a colonoscopy.  Treatment for this condition involves removing any polyps that are found. Most polyps can be removed during a  colonoscopy. This information is not intended to replace advice given to you by your health care provider. Make sure you discuss any questions you have with your health care provider. Document Revised: 07/24/2017 Document Reviewed: 07/24/2017 Elsevier Patient Education  [place] These instructions provide you with information about caring for yourself after your procedure. Your health care provider may also give you more specific instructions. Your treatment has been planned according to current medical practices, but problems sometimes occur. Call your health care provider if you have any problems or questions after your procedure. What can I expect after the procedure? After your procedure, you may:  Feel sleepy for several hours.  Feel clumsy and have poor balance for several hours.  Feel forgetful about what happened after the procedure.  Have poor judgment for several hours.  Feel nauseous or vomit.  Have a sore throat if you had a breathing tube during the procedure. Follow these instructions at home: For at least 24 hours after the procedure:      Have a responsible adult stay with you. It is important to have someone help care for you until you are awake and alert.  Rest as needed.  Do not: ? Participate in activities in which you could fall or become injured. ? Drive. ? Use heavy machinery. ? Drink alcohol. ? Take sleeping pills or medicines that cause drowsiness. ? Make important decisions or sign legal documents. ? Take care of children on your own. Eating and drinking  Follow the diet that is recommended by your health care provider.  If you vomit, drink water, juice, or soup when you can drink without vomiting.  Make sure you have little or no nausea before eating solid foods. General instructions  Take over-the-counter and prescription medicines only as told by your health care provider.  If you have  sleep apnea, surgery and certain medicines can increase your risk for breathing problems. Follow instructions from your health care provider about wearing your sleep device: ? Anytime you are sleeping, including during daytime naps. ? While taking prescription pain medicines, sleeping medicines, or medicines that make you drowsy.  If you smoke, do not smoke without supervision.  Keep all follow-up visits as told by your health care provider. This is important. Contact a health care provider if:  You keep feeling nauseous or you keep vomiting.  You feel light-headed.  You develop a rash.  You have a fever. Get help right away if:  You have trouble breathing. Summary  For several hours after your procedure, you may feel sleepy and have poor judgment.  Have a responsible adult stay with you for at least 24 hours or until you are awake and alert. This information is not intended to replace advice given to you by your health care provider. Make sure you discuss any questions you have with your health care provider. Document Revised: 07/07/2017 Document Reviewed: 07/30/2015 Elsevier Patient Education  [place].  Diverticulosis  Diverticulosis is a condition that develops when small pouches (diverticula) form in the wall of the large intestine (colon). The colon is where water is absorbed and stool (feces) is formed. The pouches form when the inside layer of the colon pushes through weak spots in the outer layers of the colon. You may have a few pouches or many of them. The pouches usually do not cause problems unless they become inflamed or infected. When this happens, the condition is called diverticulitis. What are the causes? The cause of this condition is not known. What increases the risk? The following factors may make you more likely to develop this condition:  Being older than age 65. Your risk for this condition increases with age. Diverticulosis is rare among  people younger than age 55. By age 36, many people have it.  Eating a low-fiber diet.  Having frequent constipation.  Being overweight.  Not getting enough exercise.  Smoking.  Taking over-the-counter pain medicines, like aspirin and ibuprofen.  Having a family history of diverticulosis. What are the signs or symptoms? In most people, there are no symptoms of this condition. If you do have symptoms, they may include:  Bloating.  Cramps in the abdomen.  Constipation or diarrhea.  Pain in the lower left side of the abdomen. How is this diagnosed? Because diverticulosis usually has no symptoms, it is most often diagnosed during an exam for other colon problems. The condition may be diagnosed by:  Using a flexible scope to examine the colon (colonoscopy).  Taking an X-ray of the colon after dye has been put into the colon (barium enema).  Having a CT scan. How is this treated? You may not need treatment for this condition. Your health care provider may recommend treatment to prevent problems. You may need treatment if you have symptoms or if you previously had diverticulitis. Treatment may include:  Eating a high-fiber diet.  Taking a fiber supplement.  Taking a live bacteria supplement (probiotic).  Taking medicine to relax your colon. Follow these instructions at home: Medicines  Take over-the-counter and prescription medicines only as told by your health care provider.  If told by your health care provider, take a fiber supplement or probiotic. Constipation prevention Your condition may cause constipation. To prevent or treat constipation, you may need to:  Drink enough fluid to keep your urine pale yellow.  Take over-the-counter or prescription medicines.  Eat foods that are high in fiber, such as beans, whole grains, and fresh fruits and vegetables.  Limit foods that are high in fat and processed sugars, such as fried or sweet foods.  General  instructions  Try not to strain when you have a bowel movement.  Keep all follow-up visits as told by your health care provider. This is important. Contact a health care provider if you:  Have pain in your abdomen.  Have bloating.  Have cramps.  Have not had a bowel movement in 3 days. Get help right away if:  Your pain gets worse.  Your bloating becomes very bad.  You have a fever or chills, and your symptoms suddenly get worse.  You vomit.  You have bowel movements that are bloody or black.  You have bleeding from your rectum. Summary  Diverticulosis is a condition that develops when small pouches (diverticula) form in the wall of the large intestine (colon).  You may have a few pouches or many of them.  This condition is most often diagnosed during an exam for other  colon problems.  Treatment may include increasing the fiber in your diet, taking supplements, or taking medicines. This information is not intended to replace advice given to you by your health care provider. Make sure you discuss any questions you have with your health care provider. Document Revised: 11/05/2018 Document Reviewed: 11/05/2018 Elsevier Patient Education  [place].   Colonoscopy Discharge Instructions  Read the instructions outlined below and refer to this sheet in the next few weeks. These discharge instructions provide you with general information on caring for yourself after you leave the hospital. Your doctor may also give you specific instructions. While your treatment has been planned according to the most current medical practices available, unavoidable complications occasionally occur. If you have any problems or questions after discharge, call Dr. Gala Romney at [phone]. ACTIVITY  You may resume your regular activity, but move at a slower pace for the next 24 hours.   Take frequent rest periods for the next 24 hours.   Walking will help get rid of the air and reduce the  bloated feeling in your belly (abdomen).   No driving for 24 hours (because of the medicine (anesthesia) used during the test).    Do not sign any important legal documents or operate any machinery for 24 hours (because of the anesthesia used during the test).  NUTRITION  Drink plenty of fluids.   You may resume your normal diet as instructed by your doctor.   Begin with a light meal and progress to your normal diet. Heavy or fried foods are harder to digest and may make you feel sick to your stomach (nauseated).   Avoid alcoholic beverages for 24 hours or as instructed.  MEDICATIONS  You may resume your normal medications unless your doctor tells you otherwise.  WHAT YOU CAN EXPECT TODAY  Some feelings of bloating in the abdomen.   Passage of more gas than usual.   Spotting of blood in your stool or on the toilet paper.  IF YOU HAD POLYPS REMOVED DURING THE COLONOSCOPY:  No aspirin products for 7 days or as instructed.   No alcohol for 7 days or as instructed.   Eat a soft diet for the next 24 hours.  FINDING OUT THE RESULTS OF YOUR TEST Not all test results are available during your visit. If your test results are not back during the visit, make an appointment with your caregiver to find out the results. Do not assume everything is normal if you have not heard from your caregiver or the medical facility. It is important for you to follow up on all of your test results.  SEEK IMMEDIATE MEDICAL ATTENTION IF:  You have more than a spotting of blood in your stool.   Your belly is swollen (abdominal distention).   You are nauseated or vomiting.   You have a temperature over 101.   You have abdominal pain or discomfort that is severe or gets worse throughout the day.    Colon polyp and diverticulosis information provided  Further recommendations to follow pending review of pathology report  At patient request, I called Edward Frank at [phone] contact.  Called to  [phone]-discussed results at length.

## 2019-08-02 NOTE — Op Note (Addendum)
[facility] Patient Name: Edward Frank Procedure Date: 08/02/2019 9:09 AM MRN: [id number] Date of Birth: 1945/05/13 Attending MD: Norvel Richards , MD CSN: [id number] Age: 74 Admit Type: Outpatient Procedure:                Colonoscopy Indications:              Screening in patient at increased risk: Family                            history of 1st-degree relative with colorectal                            cancer Providers:                Norvel Richards, MD, Otis Peak B. Sharon Seller, RN,                            Aram Candela Referring MD:              Medicines:                Propofol per Anesthesia Complications:            No immediate complications. Estimated Blood Loss:     Estimated blood loss was minimal. Estimated blood                            loss was minimal. Estimated blood loss was minimal. Procedure:                Pre-Anesthesia Assessment:                           - Prior to the procedure, a History and Physical                            was performed, and patient medications and                            allergies were reviewed. [name] tolerance of                            previous anesthesia was also reviewed. The risks                            and benefits of the procedure and the sedation                            options and risks were discussed with the patient.                            All questions were answered, and informed consent                            was obtained. Prior Anticoagulants: The patient has  taken no previous anticoagulant or antiplatelet                            agents. ASA Grade Assessment: III - A patient with                            severe systemic disease. After reviewing the risks                            and benefits, the patient was deemed in                            satisfactory condition to undergo the procedure.                           After obtaining  informed consent, the colonoscope                            was passed under direct vision. Throughout the                            procedure, the patient's blood pressure, pulse, and                            oxygen saturations were monitored continuously. The                            [id number] scope was introduced through                            the anus and advanced to the the cecum, identified                            by appendiceal orifice and ileocecal valve. The                            ileocecal valve, appendiceal orifice, and rectum                            were photographed. Scope In: 9:38:22 AM Scope Out: 9:54:47 AM Scope Withdrawal Time: 0 hours 8 minutes 9 seconds  Total Procedure Duration: 0 hours 16 minutes 25 seconds  Findings:      The perianal and digital rectal examinations were normal.      Scattered medium-mouthed diverticula were found in the entire colon.      A 4 mm polyp was found in the cecum. The polyp was sessile. The polyp       was removed with a cold snare. Resection and retrieval were complete.       Estimated blood loss was minimal.      The exam was otherwise without abnormality on direct and retroflexion       views. Impression:               - Diverticulosis in the entire examined colon.                           -  One 4 mm polyp in the cecum, removed with a cold                            snare. Resected and retrieved.                           - The examination was otherwise normal on direct                            and retroflexion views. Moderate Sedation:      Moderate (conscious) sedation was personally administered by an       anesthesia professional. The following parameters were monitored: oxygen       saturation, heart rate, blood pressure, respiratory rate, EKG, adequacy       of pulmonary ventilation, and response to care. Recommendation:           - Repeat colonoscopy date to be determined after                             pending pathology results are reviewed for                            surveillance based on pathology results.                           - Return to GI office (date not yet determined). Procedure Code(s):        --- Professional ---                           [phone], Colonoscopy, flexible; with removal of                            tumor(s), polyp(s), or other lesion(s) by snare                            technique Diagnosis Code(s):        --- Professional ---                           Z80.0, Family history of malignant neoplasm of                            digestive organs                           K57.30, Diverticulosis of large intestine without                            perforation or abscess without bleeding CPT copyright 2019 [employer]. All rights reserved. The codes documented in this report are preliminary and upon coder review may  be revised to meet current compliance requirements. Cristopher Estimable. Sokha Craker, MD Norvel Richards, MD 08/02/2019 10:00:35 AM This report has been signed electronically. Number of Addenda: 0

## 2019-08-02 NOTE — Anesthesia Postprocedure Evaluation (Signed)
Anesthesia Post Note  Patient: Edward Frank  Procedure(s) Performed: COLONOSCOPY WITH PROPOFOL (N/A ) POLYPECTOMY  Patient location during evaluation: PACU Anesthesia Type: General Level of consciousness: awake and alert Pain management: pain level controlled Vital Signs Assessment: post-procedure vital signs reviewed and stable Respiratory status: spontaneous breathing Cardiovascular status: stable Postop Assessment: no apparent nausea or vomiting Anesthetic complications: no     Last Vitals:  Vitals:   08/02/19 0834  BP: 131/87  Pulse: 99  Resp: (!) 31  Temp: 36.9 C  SpO2: 96%    Last Pain:  Vitals:   08/02/19 0930  TempSrc:   PainSc: Gregory

## 2019-08-02 NOTE — Anesthesia Preprocedure Evaluation (Addendum)
Anesthesia Evaluation  Patient identified by MRN, date of birth, ID band Patient awake    Reviewed: Allergy & Precautions, NPO status , Patient's Chart, lab work & pertinent test results  History of Anesthesia Complications (+) PONV and history of anesthetic complications  Airway Mallampati: II  TM Distance: >3 FB Neck ROM: Full    Dental no notable dental hx. (+) Dental Advisory Given, Caps   Pulmonary shortness of breath, with exertion and at rest, sleep apnea ,    Pulmonary exam normal        Cardiovascular hypertension, Pt. on medications + Peripheral Vascular Disease (AAA)  Normal cardiovascular exam+ dysrhythmias (Sinus bradycardia, first degree block, RBBB)   1. The left ventricle has normal systolic function with an ejection fraction of 60-65%. The cavity size was normal. There is mildly increased left ventricular wall thickness. Left ventricular diastolic Doppler parameters are consistent with impaired  relaxation. Indeterminate filling pressures The E/e' is 8-15. No evidence of left ventricular regional wall motion abnormalities.  2. The right ventricle has normal systolic function. The cavity was  normal. There is no increase in right ventricular wall thickness.  3. The mitral valve is grossly normal.  4. The aortic valve is abnormal. Mild sclerosis of the aortic valve.  Aortic valve regurgitation is trivial by color flow Doppler. No stenosis of the aortic valve.  5. There is mild dilatation of the ascending aorta measuring 40 mm.  6. The inferior vena cava was normal in size with <50% respiratory variability.  7. The average left ventricular global longitudinal strain is -17.8 %.    Neuro/Psych  Neuromuscular disease (ALS) negative psych ROS   GI/Hepatic Neg liver ROS, GERD  Medicated,  Endo/Other  Morbid obesity  Renal/GU negative Renal ROS     Musculoskeletal negative musculoskeletal ROS (+) Arthritis ,    Abdominal   Peds  Hematology negative hematology ROS (+)   Anesthesia Other Findings Day of surgery medications reviewed with the patient.  Reproductive/Obstetrics                                                             Anesthesia Evaluation  Patient identified by MRN, date of birth, ID band Patient awake    Reviewed: Allergy & Precautions, NPO status , Patient's Chart, lab work & pertinent test results  History of Anesthesia Complications (+) PONV  Airway Mallampati: II  TM Distance: >3 FB Neck ROM: Full    Dental no notable dental hx.    Pulmonary sleep apnea ,    Pulmonary exam normal breath sounds clear to auscultation       Cardiovascular hypertension, + Peripheral Vascular Disease  Normal cardiovascular exam Rhythm:Regular Rate:Normal     Neuro/Psych ?ALS  Neuromuscular disease negative psych ROS   GI/Hepatic negative GI ROS, Neg liver ROS,   Endo/Other  negative endocrine ROS  Renal/GU negative Renal ROS  negative genitourinary   Musculoskeletal negative musculoskeletal ROS (+)   Abdominal   Peds negative pediatric ROS (+)  Hematology negative hematology ROS (+)   Anesthesia Other Findings   Reproductive/Obstetrics negative OB ROS                             Anesthesia Physical Anesthesia Plan  ASA:  III  Anesthesia Plan: General   Post-op Pain Management:    Induction: Intravenous  Airway Management Planned: Oral ETT  Additional Equipment:   Intra-op Plan:   Post-operative Plan: Extubation in OR  Informed Consent: I have reviewed the patients History and Physical, chart, labs and discussed the procedure including the risks, benefits and alternatives for the proposed anesthesia with the patient or authorized representative who has indicated his/her understanding and acceptance.   Dental advisory given  Plan Discussed with: CRNA and Surgeon  Anesthesia Plan  Comments:         Anesthesia Quick Evaluation  Anesthesia Physical  Anesthesia Plan  ASA: IV  Anesthesia Plan: General   Post-op Pain Management:    Induction: Intravenous  PONV Risk Score and Plan: TIVA  Airway Management Planned: Nasal Cannula, Natural Airway and Simple Face Mask  Additional Equipment:   Intra-op Plan:   Post-operative Plan:   Informed Consent: I have reviewed the patients History and Physical, chart, labs and discussed the procedure including the risks, benefits and alternatives for the proposed anesthesia with the patient or authorized representative who has indicated his/her understanding and acceptance.     Dental advisory given  Plan Discussed with: CRNA and Surgeon  Anesthesia Plan Comments: ( )       Anesthesia Quick Evaluation

## 2019-08-03 ENCOUNTER — Encounter: Payer: Self-pay | Admitting: Internal Medicine

## 2019-08-03 LAB — SURGICAL PATHOLOGY

## 2019-09-13 DIAGNOSIS — B070 Plantar wart: Secondary | ICD-10-CM | POA: Diagnosis not present

## 2019-09-13 DIAGNOSIS — L570 Actinic keratosis: Secondary | ICD-10-CM | POA: Diagnosis not present

## 2019-09-13 DIAGNOSIS — X32XXXA Exposure to sunlight, initial encounter: Secondary | ICD-10-CM | POA: Diagnosis not present

## 2019-09-13 DIAGNOSIS — B078 Other viral warts: Secondary | ICD-10-CM | POA: Diagnosis not present

## 2019-09-13 DIAGNOSIS — L820 Inflamed seborrheic keratosis: Secondary | ICD-10-CM | POA: Diagnosis not present

## 2019-11-16 ENCOUNTER — Encounter: Payer: Self-pay | Admitting: Neurology

## 2019-11-17 ENCOUNTER — Ambulatory Visit (INDEPENDENT_AMBULATORY_CARE_PROVIDER_SITE_OTHER): Payer: No Typology Code available for payment source | Admitting: Neurology

## 2019-11-17 ENCOUNTER — Encounter: Payer: Self-pay | Admitting: Neurology

## 2019-11-17 VITALS — BP 152/93 | HR 70 | Ht 70.0 in | Wt 270.5 lb

## 2019-11-17 DIAGNOSIS — I714 Abdominal aortic aneurysm, without rupture, unspecified: Secondary | ICD-10-CM

## 2019-11-17 DIAGNOSIS — R258 Other abnormal involuntary movements: Secondary | ICD-10-CM | POA: Diagnosis not present

## 2019-11-17 DIAGNOSIS — G1221 Amyotrophic lateral sclerosis: Secondary | ICD-10-CM | POA: Insufficient documentation

## 2019-11-17 DIAGNOSIS — R0683 Snoring: Secondary | ICD-10-CM

## 2019-11-17 DIAGNOSIS — G4730 Sleep apnea, unspecified: Secondary | ICD-10-CM

## 2019-11-17 DIAGNOSIS — I517 Cardiomegaly: Secondary | ICD-10-CM

## 2019-11-17 DIAGNOSIS — G4710 Hypersomnia, unspecified: Secondary | ICD-10-CM | POA: Diagnosis not present

## 2019-11-17 DIAGNOSIS — G1229 Other motor neuron disease: Secondary | ICD-10-CM

## 2019-11-17 NOTE — Patient Instructions (Signed)

## 2019-11-17 NOTE — Progress Notes (Signed)
SLEEP MEDICINE CLINIC    Provider:  Larey Seat, MD  Primary Care Physician:  [name], MD [place] [address]     Referring Provider: [place] transfer for [place]- followed by Dr Hosie Poisson at [facility] and Dr. Vallarie Mare at [facility].       Chief Complaint according to patient   Patient presents with:    . 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 The patient had the first sleep study in the year 2011  with a result of an AHI ( Apnea Hypopnea index)  Of OSA- and he placed on CPAP- but the DME reclaimed the ,achine, he lost weight and had a repeat study which then was negative for OSA.      Sleep relevant medical history: Nocturia 1-2 times, wife feels it is more frequent- snoring and witnessed gasping, apnea.    Family medical /sleep history: No other family member on CPAP with OSA, insomnia, sleep walkers.    Social history:  Patient is retired Animal nutritionist and married to a Psychologist, clinical- [employer] - and lives in a household with spouse and son. Pets are present. 1 cat. He is a [place] referral.  Tobacco use never.  ETOH use- quit , seldomly drinks  ,  Caffeine intake in form of Coffee( 1-2 cups in AM )  Hobbies : tinkers on old cars.     Sleep habits are as follows: [name] dinner  time is between 7-8 PM. He has mild dysphagia, more dysphonia.  The patient goes to bed at 11 PM and it will take 30 minutes to sleep- he sleeps in a hospital bed.  continues to sleep on his back  for 2-3 hours, wakes for several  bathroom breaks.   The preferred sleep position is related to back pain and shoulder pain- he will switch into the lift chair. , with the support of 2-3  pillows. Dreams are reportedly rare/ frequent/vivid.  7-8 AM is the usual rise time. The patient wakes up spontaneously. He can fal easily asleep again.  He reports not feeling refreshed or restored in AM, with symptoms such as dry mouth,and residual fatigue. Naps are taken frequently.    Review of Systems: Out of a complete 14 system review, the patient complains of only the following symptoms, and all other reviewed systems are negative.:  Fatigue, sleepiness , snoring, fragmented sleep ALS related clonus, back pain, joint pain clonus.     Unable to get up without assistance. .  How likely are you to doze in the following situations: 0 = not likely, 1 = slight chance, 2 = moderate chance, 3 = high chance   Sitting and Reading? Watching Television? Sitting inactive in a public place (theater or meeting)?  As a passenger in a car for an hour without a break? Lying down in the afternoon when circumstances permit? Sitting and talking to someone? Sitting quietly after lunch without alcohol? In a car, while stopped for a few minutes in traffic?   Total = 14 / 24 points   FSS endorsed at 33/ 63 points.   Social History   Socioeconomic History  . Marital status: Married    Spouse name: Not on file  . Number of children: Not on file  . Years of education: Not on file  . Highest education level: Not on file  Occupational History  . Occupation: Retired     Fish farm manager: [employer]  Tobacco Use  . Smoking status: Never Smoker  . Smokeless tobacco: Never Used  Vaping Use  . Vaping Use: Never used    Substance and Sexual Activity  . Alcohol use: Yes    Alcohol/week: 0.0 standard drinks    Comment: rarely  . Drug use: No  . Sexual activity: Yes  Other Topics Concern  . Not on file  Social History Narrative   Minimal exercise: due to PLS.   Veterinarian   Healthy diet.   Full Code.   No living will, no HCPOA. (reviewed 2014)   Right handed    Social Determinants of Health   Financial Resource Strain:   . Difficulty of Paying Living Expenses:   Food Insecurity:   . Worried About Charity fundraiser in the Last Year:   . Arboriculturist in the Last Year:   Transportation Needs:   . Film/video editor (Medical):   [name] Lack of Transportation (Non-Medical):   Physical Activity:   . Days of Exercise per Week:   . Minutes of Exercise per Session:   Stress:   . Feeling of Stress :   Social Connections:   . Frequency of Communication with Friends and Family:   . Frequency of Social Gatherings with Friends and Family:   . Attends Religious Services:   . Active Member of Clubs or Organizations:   . Attends Archivist Meetings:   [name] Marital Status:     Family History  Problem Relation Age of Onset  . Heart disease Mother        ? afib and got pacemaker  . Uterine cancer Mother   . Prostate cancer Father   . Hypertension Father   . Kidney failure Father   . Stroke Father   . Colon cancer Sister 55    Past Medical History:  Diagnosis Date  . Arthritis   . Bradycardia   . Fatty liver   . Fracture, tibia, with fibula teenage yrs.    no surgery, wore cast  . GERD (gastroesophageal reflux disease)   . Hyperlipidemia   . Hypertension   . 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 OSA (obstructive sleep apnea) 01/22/2011  . PONV (postoperative nausea and vomiting)   . 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 Right bundle branch block   . Syncope     Past Surgical History:  Procedure Laterality Date  .  CHOLECYSTECTOMY N/A 06/13/2016   Procedure: LAPAROSCOPIC CHOLECYSTECTOMY;  Surgeon: Arta Bruce Kinsinger, MD;  Location: [facility];  Service: General;  Laterality: N/A;  . COLONOSCOPY WITH PROPOFOL N/A 08/02/2019   Procedure: COLONOSCOPY WITH PROPOFOL;  Surgeon: Daneil Dolin, MD;  [facility];  Service: Endoscopy;  Laterality: N/A;  10:00am  . EYE SURGERY     Cataracts bil  . FOOT SURGERY  11-2008   hammer toe and bunion  . HERNIA REPAIR  12-2001   inguinal hernia bilateral  . POLYPECTOMY  08/02/2019   Procedure: POLYPECTOMY;  Surgeon: Daneil Dolin, MD;  [facility]: [facility];  Service: Endoscopy;;  . REVERSE SHOULDER ARTHROPLASTY Left 05/28/2018   Procedure: REVERSE SHOULDER ARTHROPLASTY;  Surgeon: Justice Britain, MD;  Location: [facility];  Service: Orthopedics;  Laterality: Left;  126min  . SHOULDER ARTHROSCOPY WITH ROTATOR CUFF REPAIR AND SUBACROMIAL DECOMPRESSION Right 06/18/2012   Procedure: RIGHT SHOULDER ARTHROSCOPY WITH SUBACROMIAL DECOMPRESSION AND DISTAL CLAVICLE RESECTION AND ROTATOR CUFF REPAIR;  Surgeon: Marin Shutter, MD;  Location: [facility];  Service: Orthopedics;  Laterality: Right;  . SHOULDER SURGERY  1977   Luxating      Current Outpatient Medications on File Prior to Visit  Medication Sig Dispense Refill  . Albuterol Sulfate (PROAIR RESPICLICK) 867 (90 Base) MCG/ACT AEPB Inhale 1-2 puffs into the lungs every 4 (four) hours as needed.    [name] allopurinol (ZYLOPRIM) 100 MG tablet Take 100 mg by mouth daily.    [name] aspirin EC 81 MG tablet Take 81 mg by mouth daily.    . Calcium Citrate-Vitamin D 315-250 MG-UNIT TABS Take 1 tablet by mouth daily.    . chlorthalidone (HYGROTON) 50 MG tablet Take 1 tablet (50 mg total) by mouth daily. 90 tablet 3  . Coenzyme Q10 (COQ10) 400 MG CAPS Take 400 mg by mouth daily.    . Cranberry 500 MG CAPS Take 500 mg by mouth 2 (two) times daily.     . Cyanocobalamin (VITAMIN B-12) 2500 MCG SUBL Place 2,500 mcg under the tongue daily.    [name]  desonide (DESOWEN) 0.05 % cream Apply 1 application topically 2 (two) times daily as needed (irritation).     . fluticasone (FLONASE) 50 MCG/ACT nasal spray Place 2 sprays into both nostrils daily.    . hydrocortisone (ANUSOL-HC) 2.5 % rectal cream Place 1 application rectally 2 (two) times daily as needed for hemorrhoids or anal itching.    . indomethacin (INDOCIN) 25 MG capsule Take 25 mg by mouth daily.     [name] losartan (COZAAR) 50 MG tablet Take 1 tablet by mouth once daily (Patient taking differently: Take 50 mg by mouth daily. ) 90 tablet 3  . methocarbamol (ROBAXIN) 750 MG tablet Take 750 mg by mouth 2 (two) times daily.     . Mometasone Furoate POWD 220 mcg by Does not apply route 2 (two) times daily. INHALE 2 PUFFS BY MOUTH BID    . Multiple Vitamin (MULTIVITAMIN WITH MINERALS) TABS Take 1 tablet by mouth daily.    . Omega-3 Fatty Acids (FISH OIL TRIPLE STRENGTH) 1400 MG CAPS Take 2,800 mg by mouth 2 (two) times daily.     [name] omeprazole (PRILOSEC) 20 MG capsule Take 40 mg by mouth daily.     [name] OVER THE COUNTER MEDICATION Take 1 tablet by mouth daily. Calcium citrate+D3 500-800 mg    . polyethylene glycol-electrolytes (TRILYTE) 420 g solution Take 4,000 mLs by mouth as directed. 4000 mL 0  . pravastatin (PRAVACHOL) 20 MG tablet TAKE 1 TABLET BY MOUTH AT BEDTIME (Patient taking differently: Take 20 mg by mouth at bedtime. ) 90 tablet 3  . RESVERATROL 100 MG CAPS Take 100 mg by mouth daily.     [name] tiZANidine (ZANAFLEX) 4 MG tablet Take 4 mg by mouth daily.    [name]  traMADol (ULTRAM) 50 MG tablet Take 50 mg by mouth 2 (two) times daily.      No current facility-administered medications on file prior to visit.    Allergies  Allergen Reactions  . Gabapentin Swelling    Peripheral edema in the legs, feet, and arms  . Riluzole Other (See Comments)    Peripheral edema in the legs, feet, arms     Physical exam:  Today's Vitals   11/17/19 1104  BP: (!) 152/93  Pulse: 70  SpO2: 98%  Weight:  (!) 270 lb 8 oz (122.7 kg)  Height: 5\' 10"  (1.778 m)   Body mass index is 38.81 kg/m.   Wt Readings from Last 3 Encounters:  11/17/19 (!) 270 lb 8 oz (122.7 kg)  05/18/19 266 lb (120.7 kg)  03/29/19 273 lb (123.8 kg)     Ht Readings from Last 3 Encounters:  11/17/19 5\' 10"  (1.778 m)  07/29/19 5\' 10"  (1.778 m)  05/18/19 5\' 10"  (1.778 m)      General: The patient is awake, alert and appears not in acute distress. The patient is well groomed. Head: Normocephalic, atraumatic.  Neck is supple. Mallampati 1- with mild tongue tremor- no visible fasciculation.  neck circumference: 19 inches .  Nasal airflow  patent.  Retrognathia is not  seen.  Dental status: intact  Cardiovascular:  Regular rate and cardiac rhythm by pulse,  without distended neck veins. Respiratory: Lungs are clear to auscultation.  Skin:  Without evidence of ankle edema, or rash. Trunk: [name] posture is erect.   Neurologic exam : The patient is awake and alert, oriented to place and time.   Memory subjective described as intact.  Attention span & concentration ability appears normal.  Speech is fluent, he is alert and easy to understand-   with dysarthria, dysphonia.  Mood and affect are appropriate.   Cranial nerves: no loss of smell or taste reported  Pupils are equal and briskly reactive to light. Funduscopic exam deferred. .  Extraocular movements in vertical and horizontal planes were intact and without nystagmus. No Diplopia. Visual fields by finger perimetry are intact. Hearing was intact to soft voice and finger rubbing.   Facial sensation intact to fine touch. Facial motor strength is symmetric and tongue and uvula move midline.  Neck ROM : rotation, tilt and flexion extension were normal for age and shoulder shrug was symmetrical.    Motor exam:  Symmetric bulk, tone and ROM.   atrophy of inter/ intra-digital muscles, fine motor skills are affected, grip is still present  right weaker than  left.  Normal tone without cog wheeling, symmetric grip strength  Sensory:  Fine touch, pinprick and vibration were tested  and  normal.  Proprioception tested in the upper extremities was normal. Coordination: Rapid alternating movements in the fingers/hands were of normal speed.  The Finger-to-nose maneuver was intact without evidence of ataxia, dysmetria or tremor. Gait and station: Patient could rise unassisted from a seated position, walked with a cane as assistive device.  Limited in distance.  Very high fall risk.  Deep tendon reflexes: in the  upper and lower extremities are brisk, clonus is noted.     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  3)  Here for sleep only- has 2 tertiary care neurologists.    My Plan is to proceed with:  1) attended sleep study in room 2, needs special bed. Needs assistance - dressing, fully vaccinated, and had the disease. He was treated with monoclonal antibodies.  2) this patient needs possibly OXYGEN - attention. I want him to use the most comfortable nasal pillow or nasal mask first.    [facility] would like to [facility]- for [facility] me to meet with and to take care of this pleasant patient.   In short, Edward Frank is presenting with ALS related nocturnal breathing difficulties.  wqe will order a SPLIT study, and allowed to split at AHI 20- see [facility] referral.     I plan to follow up personally within 2-4 month.    Electronically signed by: Larey Seat, MD 11/17/2019 11:19 AM  [facility] Board certified by The [employer] of Sleep Medicine and Diplomate of the [employer] of Sleep Medicine. Board certified In Neurology through the [place], Fellow of the [employer] of Neurology. Medical Director of [facility].

## 2019-12-02 ENCOUNTER — Telehealth: Payer: Self-pay | Admitting: Family Medicine

## 2019-12-02 ENCOUNTER — Other Ambulatory Visit: Payer: Self-pay

## 2019-12-02 ENCOUNTER — Other Ambulatory Visit (INDEPENDENT_AMBULATORY_CARE_PROVIDER_SITE_OTHER): Payer: Medicare Other

## 2019-12-02 DIAGNOSIS — E7800 Pure hypercholesterolemia, unspecified: Secondary | ICD-10-CM

## 2019-12-02 DIAGNOSIS — R7303 Prediabetes: Secondary | ICD-10-CM

## 2019-12-02 LAB — COMPREHENSIVE METABOLIC PANEL
ALT: 43 U/L (ref 0–53)
AST: 32 U/L (ref 0–37)
Albumin: 4.4 g/dL (ref 3.5–5.2)
Alkaline Phosphatase: 51 U/L (ref 39–117)
BUN: 15 mg/dL (ref 6–23)
CO2: 28 mEq/L (ref 19–32)
Calcium: 9.8 mg/dL (ref 8.4–10.5)
Chloride: 108 mEq/L (ref 96–112)
Creatinine, Ser: 1.18 mg/dL (ref 0.40–1.50)
GFR: 60.03 mL/min (ref >60.00)
Glucose, Bld: 106 mg/dL — ABNORMAL HIGH (ref 70–99)
Potassium: 4.3 mEq/L (ref 3.5–5.1)
Sodium: 142 mEq/L (ref 135–145)
Total Bilirubin: 0.3 mg/dL (ref 0.2–1.2)
Total Protein: 7 g/dL (ref 6.0–8.3)

## 2019-12-02 LAB — LIPID PANEL
Cholesterol: 151 mg/dL (ref 0–200)
HDL: 38.9 mg/dL — ABNORMAL LOW (ref >39.00)
NonHDL: 111.87
Total CHOL/HDL Ratio: 4
Triglycerides: 236 mg/dL — ABNORMAL HIGH (ref 0.0–149.0)
VLDL: 47.2 mg/dL — ABNORMAL HIGH (ref 0.0–40.0)

## 2019-12-02 LAB — LDL CHOLESTEROL, DIRECT: Direct LDL: 70 mg/dL

## 2019-12-02 LAB — HEMOGLOBIN A1C: Hgb A1c MFr Bld: 5.8 % (ref 4.6–6.5)

## 2019-12-02 NOTE — Telephone Encounter (Signed)
-----   Message from Cloyd Stagers, RT sent at 11/18/2019 10:03 AM EDT ----- Regarding: Lab Orders for Thursday 8.12.2021 Please place lab orders for Thursday 8.12.2021, office visit for physical on Tuesday 8.17.2021 Thank you, Dyke Maes RT(R)

## 2019-12-03 NOTE — Progress Notes (Signed)
No critical labs need to be addressed urgently. We will discuss labs in detail at upcoming office visit.   

## 2019-12-07 ENCOUNTER — Encounter: Payer: Medicare Other | Admitting: Family Medicine

## 2019-12-07 ENCOUNTER — Encounter: Payer: Self-pay | Admitting: Family Medicine

## 2019-12-07 ENCOUNTER — Other Ambulatory Visit: Payer: Self-pay

## 2019-12-07 ENCOUNTER — Ambulatory Visit (INDEPENDENT_AMBULATORY_CARE_PROVIDER_SITE_OTHER): Payer: Medicare Other | Admitting: Family Medicine

## 2019-12-07 VITALS — BP 126/74 | HR 71 | Temp 95.9°F | Ht 70.0 in | Wt 275.5 lb

## 2019-12-07 DIAGNOSIS — Z0000 Encounter for general adult medical examination without abnormal findings: Secondary | ICD-10-CM | POA: Diagnosis not present

## 2019-12-07 DIAGNOSIS — I10 Essential (primary) hypertension: Secondary | ICD-10-CM

## 2019-12-07 DIAGNOSIS — R7303 Prediabetes: Secondary | ICD-10-CM

## 2019-12-07 DIAGNOSIS — Z125 Encounter for screening for malignant neoplasm of prostate: Secondary | ICD-10-CM

## 2019-12-07 DIAGNOSIS — E7800 Pure hypercholesterolemia, unspecified: Secondary | ICD-10-CM | POA: Diagnosis not present

## 2019-12-07 DIAGNOSIS — G1221 Amyotrophic lateral sclerosis: Secondary | ICD-10-CM | POA: Diagnosis not present

## 2019-12-07 MED ORDER — AZELASTINE HCL 0.1 % NA SOLN: 2.0000 | Freq: Two times a day (BID) | NASAL | 12 refills | Status: AC

## 2019-12-07 NOTE — Assessment & Plan Note (Signed)
Mild stable diet control.

## 2019-12-07 NOTE — Patient Instructions (Signed)
Trial of Astelin spray for vasomotor rhinitis.  If not improving can add back  Flonase 2 sprays per nostril.

## 2019-12-07 NOTE — Assessment & Plan Note (Addendum)
LDL At goal on pravastatin.  Work on low fatty acid diet to lower triglycerides. Recheck in 3 months.

## 2019-12-07 NOTE — Assessment & Plan Note (Signed)
Well controlled. Continue current medication.  

## 2019-12-07 NOTE — Progress Notes (Signed)
Chief Complaint  Patient presents with  . Medicare Wellness    History of Present Illness: HPI  The patient presents for annual medicare wellness, complete physical and review of chronic health problems. He/She also has the following acute concerns today:  I have personally reviewed the Medicare Annual Wellness questionnaire and have noted 1. [name] medical and social history 2. Their use of alcohol, tobacco or illicit drugs 3. Their current medications and supplements 4. [name] functional ability including ADL's, fall risks, home safety risks and hearing or visual             impairment. 5. Diet and physical activities 6. Evidence for depression or mood disorders 7.         Updated provider list Cognitive evaluation was performed and recorded on pt medicare questionnaire form. The patients weight, height, BMI and visual acuity have been recorded in the chart  I have made referrals, counseling and provided education to the patient based review of the above and I have provided the pt with a written personalized care plan for preventive services.   Documentation of this information was scanned into the electronic record under the media tab.   Advance directives and end of life planning reviewed in detail with patient and documented in EMR. Patient given handout on advance care directives if needed. HCPOA and living will updated if needed.  PHQ 9 0  Pt high risk for falls with neurologic disease. The patient has a history of multiplefalls. I did complete a risk assessment for falls. A plan of care for falls was documented. Will use walker or cane. Home safety discussed.   Hearing Screening   125Hz  250Hz  500Hz  1000Hz  2000Hz  3000Hz  4000Hz  6000Hz  8000Hz   Right ear:   40 40 40  40    Left ear:   40 40 40  40      Visual Acuity Screening   Right eye Left eye Both eyes  Without correction: 20/20 20/20 20/20   With correction:       Dr. Brett Fairy scheduled  a Sleep  study 12/24/2019  PLS/upper motor dominant AS: Continue slow progression change. Was followed at [facility].Now followed by Dr. Colin Rhein has walker and cane.  less endurance, weaker over all.  Hypertension:   Good control on current regimen. Losartan. BP Readings from Last 3 Encounters:  12/07/19 126/74  11/17/19 (!) 152/93  08/02/19 136/85  Using medication without problems or lightheadedness: none Chest pain with exertion: none Edema:none Short of breath: stable but significant... using Hoverround for longer distances. Average home BPs: Other issues: Wt Readings from Last 3 Encounters:  12/07/19 275 lb 8 oz (125 kg)  11/17/19 (!) 270 lb 8 oz (122.7 kg)  05/18/19 266 lb (120.7 kg)   Elevated Cholesterol:  At goal on pravastatin. Lab Results  Component Value Date   CHOL 151 12/02/2019   HDL 38.90 (L) 12/02/2019   LDLCALC 85 12/03/2018   LDLDIRECT 70.0 12/02/2019   TRIG 236.0 (H) 12/02/2019   CHOLHDL 4 12/02/2019  Using medications without problems: Muscle aches:  Diet compliance: moderate Exercise: unable Other complaints:  Prediabetes:  Lab Results  Component Value Date   HGBA1C 5.8 12/02/2019   AAA:  3.6 cm on CT in Feb of 2019.  No further imaging  per Dr. Percival Spanish note fall 2020.   This visit occurred during the SARS-CoV-2 public health emergency.  Safety protocols were in place, including screening questions prior to the visit, additional usage of staff PPE, and extensive  cleaning of exam room while observing appropriate contact time as indicated for disinfecting solutions.   COVID 19 screen:  No recent travel or known exposure to COVID19 The patient denies respiratory symptoms of COVID 19 at this time. The importance of social distancing was discussed today.     ROS    Past Medical History:  Diagnosis Date  . Arthritis   . Bradycardia   . Fatty liver   . Fracture, tibia, with fibula teenage yrs.    no surgery, wore cast  . GERD (gastroesophageal reflux  disease)   . Hyperlipidemia   . Hypertension   . 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 OSA (obstructive sleep apnea) 01/22/2011  . PONV (postoperative nausea and vomiting)   . Primary lateral sclerosis ([place])   . Right bundle branch block   . Syncope     reports that he has never smoked. He has never used smokeless tobacco. He reports current alcohol use. He reports that he does not use drugs.   Current Outpatient Medications:  .  Albuterol Sulfate (PROAIR RESPICLICK) 403 (90 Base) MCG/ACT AEPB, Inhale 1-2 puffs into the lungs every 4 (four) hours as needed., Disp: , Rfl:  .  allopurinol (ZYLOPRIM) 100 MG tablet, Take 100 mg by mouth daily., Disp: , Rfl:  .  aspirin EC 81 MG tablet, Take 81 mg by mouth daily., Disp: , Rfl:  .  Calcium Citrate-Vitamin D 315-250 MG-UNIT TABS, Take 1 tablet by mouth daily., Disp: , Rfl:  .  chlorthalidone (HYGROTON) 50 MG tablet, Take 1 tablet (50 mg total) by mouth daily., Disp: 90 tablet, Rfl: 3 .  Coenzyme Q10 (COQ10) 400 MG CAPS, Take 400 mg by mouth daily., Disp: , Rfl:  .  Cranberry 500 MG CAPS, Take 500 mg by mouth 2 (two) times daily. , Disp: , Rfl:  .  Cyanocobalamin (VITAMIN B-12) 2500 MCG SUBL, Place 2,500 mcg under the tongue daily., Disp: , Rfl:  .  desonide (DESOWEN) 0.05 % cream, Apply 1 application topically 2 (two) times daily as needed (irritation). , Disp: , Rfl:  .  fluticasone (FLONASE) 50 MCG/ACT nasal spray, Place 2 sprays into both nostrils daily., Disp: , Rfl:  .  hydrocortisone (ANUSOL-HC) 2.5 % rectal cream, Place 1 application rectally 2 (two) times daily as needed for hemorrhoids or anal itching., Disp: , Rfl:  .  indomethacin (INDOCIN) 25 MG capsule, Take 25 mg by mouth daily. , Disp: , Rfl:  .  losartan (COZAAR) 50 MG tablet, Take 1 tablet by mouth once daily (Patient taking differently: Take 50 mg by mouth daily. ), Disp: 90 tablet, Rfl: 3 .  methocarbamol (ROBAXIN) 750 MG  tablet, Take 750 mg by mouth 2 (two) times daily. , Disp: , Rfl:  .  Mometasone Furoate POWD, 220 mcg by Does not apply route 2 (two) times daily. INHALE 2 PUFFS BY MOUTH BID, Disp: , Rfl:  .  Multiple Vitamin (MULTIVITAMIN WITH MINERALS) TABS, Take 1 tablet by mouth daily., Disp: , Rfl:  .  Omega-3 Fatty Acids (FISH OIL TRIPLE STRENGTH) 1400 MG CAPS, Take 2,800 mg by mouth 2 (two) times daily. , Disp: , Rfl:  .  omeprazole (PRILOSEC) 20 MG capsule, Take 40 mg by mouth daily. , Disp: , Rfl:  .  OVER THE COUNTER MEDICATION, Take 1 tablet by mouth daily. Calcium citrate+D3 500-800 mg, Disp: , Rfl:  .  polyethylene glycol-electrolytes (TRILYTE) 420 g solution, Take  4,000 mLs by mouth as directed., Disp: 4000 mL, Rfl: 0 .  pravastatin (PRAVACHOL) 20 MG tablet, TAKE 1 TABLET BY MOUTH AT BEDTIME (Patient taking differently: Take 20 mg by mouth at bedtime. ), Disp: 90 tablet, Rfl: 3 .  RESVERATROL 100 MG CAPS, Take 100 mg by mouth daily. , Disp: , Rfl:  .  tiZANidine (ZANAFLEX) 4 MG tablet, Take 4 mg by mouth daily., Disp: , Rfl:  .  traMADol (ULTRAM) 50 MG tablet, Take 50 mg by mouth 2 (two) times daily. , Disp: , Rfl:    Observations/Objective: Blood pressure 126/74, pulse 71, temperature (!) 95.9 F (35.5 C), temperature source Temporal, height 5\' 10"  (1.778 m), weight 275 lb 8 oz (125 kg), SpO2 98 %.  Physical Exam   Assessment and Plan [name] preventative maintenance and recommended screening tests for an annual wellness exam were reviewed in full today. Brought up to date unless services declined.  Counselled on the importance of diet, exercise, and its role in overall health and mortality. [name] FH and SH was reviewed, including their home life, tobacco status, and drug and alcohol status.   Vaccines: Uptodate pneumovax/prevnar and shingles.  Prostate: Father with prostate cancer age 2s. Discussed in detail, have chose to check PSA and rectal exam yearly. Lab Results   Component Value Date   PSA 1.76 12/03/2018   PSA 2.07 11/20/2017   PSA 1.93 11/18/2016  Colon: Sister with colon cancer.  07/2019, likely nonsmoker Hep C screening: done  ALS (amyotrophic lateral sclerosis) ([place]) Followed by neurology. Progressive.  HYPERCHOLESTEROLEMIA LDL At goal on pravastatin.  Work on low fatty acid diet to lower triglycerides. Recheck in 3 months.  HYPERTENSION, MILD Well controlled. Continue current medication.   Prediabetes Mild stable diet control.  Vasomotor rhinitis: Trial of Astelin and then combination with nasal steroid if needed.Eliezer Lofts, MD

## 2019-12-07 NOTE — Assessment & Plan Note (Signed)
Followed by neurology. Progressive.

## 2019-12-24 ENCOUNTER — Ambulatory Visit (INDEPENDENT_AMBULATORY_CARE_PROVIDER_SITE_OTHER): Payer: Medicare Other | Admitting: Neurology

## 2019-12-24 ENCOUNTER — Other Ambulatory Visit: Payer: Self-pay

## 2019-12-24 DIAGNOSIS — G1221 Amyotrophic lateral sclerosis: Secondary | ICD-10-CM

## 2019-12-24 DIAGNOSIS — G4710 Hypersomnia, unspecified: Secondary | ICD-10-CM

## 2019-12-24 DIAGNOSIS — R258 Other abnormal involuntary movements: Secondary | ICD-10-CM

## 2019-12-24 DIAGNOSIS — R0683 Snoring: Secondary | ICD-10-CM

## 2019-12-24 DIAGNOSIS — G1223 Primary lateral sclerosis: Secondary | ICD-10-CM

## 2019-12-24 DIAGNOSIS — G4733 Obstructive sleep apnea (adult) (pediatric): Secondary | ICD-10-CM | POA: Diagnosis not present

## 2019-12-24 DIAGNOSIS — R0689 Other abnormalities of breathing: Secondary | ICD-10-CM

## 2020-01-05 DIAGNOSIS — R0683 Snoring: Secondary | ICD-10-CM | POA: Insufficient documentation

## 2020-01-05 DIAGNOSIS — R0689 Other abnormalities of breathing: Secondary | ICD-10-CM | POA: Insufficient documentation

## 2020-01-05 DIAGNOSIS — R258 Other abnormal involuntary movements: Secondary | ICD-10-CM | POA: Insufficient documentation

## 2020-01-05 NOTE — Progress Notes (Signed)
Cc: Stage manager.  IMPRESSION: Notable difficulties to sleep, total sleep time was  around 208 minutes.   1. REM sleep dependent mild Obstructive Sleep Apnea (OSA), with  an AHI of 9.8 and REM AHI of 43/h.  2. The REM dependency is explained by the presence of an upper  motoneuron disease.  3. No prolonged sleep hypoxemia was noted (15 minutes total),  [place] was 78% SpO2.    RECOMMENDATIONS: CPAP or BiPAP therapy are most helpful in REM  dependent sleep apnea.  Since the stay in the lab was rather difficult for this patient  and his spouse, I would like to start with an auto CPAP trial of  therapy, and review therapeutic data after 2-3 month of use.    CPAP autotitration device settings from 6-16 cm water, 3 cm EPR  and mask of patient's choice, under heated humidification.

## 2020-01-05 NOTE — Procedures (Signed)
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 He is explicitly seen for sleep testing only by [facility]- his primary neurologist is at [facility].  He has a past medical history of ALS/ PLS - Motoneuron disorder ([place]), OSA (obstructive sleep apnea) (01/22/2011), PONV (postoperative nausea and vomiting), Primary lateral sclerosis ([place]), Right bundle branch block, and Syncope. The patient had the first sleep study in the year 2011 with a result of an AHI (Apnea Hypopnea index) diagnostic for OSA- was placed on CPAP- but the DME reclaimed the machine(?) after that, he lost weight followed by a repeat study which was negative for OSA.     The patient endorsed the Epworth Sleepiness Scale at 14 points.   [name] weight 273 pounds with a height of 70 (inches), resulting in a BMI of 39.1 kg/m2. [name] neck circumference measured 19 inches.  CURRENT MEDICATIONS: Proair, Zyloprim, ASA 81mg , Calcium-Vit D, [place]ton, CoQ10, Cranberry caps, Vit B12, Desowen cream, Anusol, Flonase, Indocin, Cozaar, Robaxin, Mometasone Furoate, Multivitamin and Mineral, Fish Oil, Prilosec, Pravachol, Zanaflex, Ultram   PROCEDURE:  This is a multichannel digital polysomnogram utilizing the Somnostar 11.2 system.  Electrodes and sensors were applied and monitored per AASM Specifications.   EEG, EOG, Chin and Limb EMG, were sampled at 200 Hz.  ECG, Snore and Nasal Pressure, Thermal Airflow, Respiratory Effort, CPAP Flow and Pressure, Oximetry was sampled at 50 Hz. Digital video and audio were recorded.      BASELINE STUDY: Lights Out was at 22:27 and Lights On at 04:28.  Total recording time (TRT) was 361 minutes, with a total  sleep time (TST) of 208.5 minutes.   [name] sleep latency was 79.5 minutes.  REM latency was 83.5 minutes.  The sleep efficiency was 57.8 %.     SLEEP ARCHITECTURE: WASO (Wake after sleep onset) was 100.5 minutes.  There were 11 minutes in Stage N1, 169 minutes Stage N2, 2 minutes Stage N3 and 26.5 minutes in Stage REM.  The percentage of Stage N1 was 5.3%, Stage N2 was 81.1%, Stage N3 was 1.% and Stage R (REM sleep) was 12.7%.   RESPIRATORY ANALYSIS:  There were a total of 34 respiratory events:  16 obstructive apneas, 0 central apneas and 0 mixed apneas with a total of 16 apneas and an apnea index (AI) of 4.6 /hour.  There were 18 hypopneas with a hypopnea index of 5.2 /hour.  The total APNEA/HYPOPNEA INDEX (AHI) was 9.8/hour.  19 events occurred in REM sleep and 26 events in NREM. The REM AHI was  43.0 /hour, versus a non-REM AHI of 4.9. The patient spent 208.5 minutes of total sleep time in the supine position and 0 minutes in non-supine. The supine AHI was 9.8/h versus a non-supine AHI of 0.0.  OXYGEN SATURATION & C02:  The Wake baseline 02 saturation was 92%, with the lowest being 78%. Time spent below 89% saturation equaled 15 minutes.  The arousals were noted as: 17 were spontaneous, 0 were associated with PLMs, 11 were associated with respiratory events. The patient had a total of 0 Periodic Limb Movements.    Audio and video analysis did  not show any abnormal or unusual movements, behaviors, phonations or vocalizations.   EKG was regular.   IMPRESSION: Notable difficulties to sleep, total sleep time was around 208 minutes.   1. REM sleep dependent mild Obstructive Sleep Apnea (OSA), with an AHI of 9.8 and REM AHI of 43/h. 2. The REM dependency is explained by the presence of an upper motoneuron disease.  3. No prolonged sleep hypoxemia was noted (15 minutes total), Nadir was 78% SpO2.   RECOMMENDATIONS: CPAP or BiPAP therapy are most helpful in REM dependent sleep  apnea. Since the stay in the lab was rather difficult for this patient and his spouse, I would like to start with an auto CPAP trial of therapy, and review therapeutic data after 2-3 month of use.    CPAP autotitration device settings from 6-16 cm water, 3 cm EPR and mask of patient's choice, under heated humidification.   I certify that I have reviewed the entire raw data recording prior to the issuance of this report in accordance with the Standards of Accreditation of the American Academy of Sleep Medicine (AASM)      Larey Seat, MD Diplomat, American Board of Psychiatry and Neurology  Diplomat, American Board of Sleep Medicine Market researcher, [place] [facility] [employer]

## 2020-01-05 NOTE — Addendum Note (Signed)
Addended by: Larey Seat on: 01/05/2020 12:37 PM   Modules accepted: Orders

## 2020-01-06 ENCOUNTER — Telehealth: Payer: Self-pay | Admitting: Neurology

## 2020-01-06 NOTE — Telephone Encounter (Signed)
-----   Message from Larey Seat, MD sent at 01/05/2020 12:37 PM EDT ----- Cc: [employer] provider.  IMPRESSION: Notable difficulties to sleep, total sleep time was  around 208 minutes.   1. REM sleep dependent mild Obstructive Sleep Apnea (OSA), with  an AHI of 9.8 and REM AHI of 43/h.  2. The REM dependency is explained by the presence of an upper  motoneuron disease.  3. No prolonged sleep hypoxemia was noted (15 minutes total),  Nadir was 78% SpO2.    RECOMMENDATIONS: CPAP or BiPAP therapy are most helpful in REM  dependent sleep apnea.  Since the stay in the lab was rather difficult for this patient  and his spouse, I would like to start with an auto CPAP trial of  therapy, and review therapeutic data after 2-3 month of use.    CPAP autotitration device settings from 6-16 cm water, 3 cm EPR  and mask of patient's choice, under heated humidification.

## 2020-01-06 NOTE — Telephone Encounter (Signed)
Called patient to discuss sleep study results. No answer at this time. LVM for the patient to call back.   

## 2020-01-11 ENCOUNTER — Telehealth: Payer: Self-pay | Admitting: Neurology

## 2020-01-11 NOTE — Telephone Encounter (Signed)
-----   Message from Larey Seat, MD sent at 01/05/2020 12:37 PM EDT ----- Cc: [employer] provider.  IMPRESSION: Notable difficulties to sleep, total sleep time was  around 208 minutes.   1. REM sleep dependent mild Obstructive Sleep Apnea (OSA), with  an AHI of 9.8 and REM AHI of 43/h.  2. The REM dependency is explained by the presence of an upper  motoneuron disease.  3. No prolonged sleep hypoxemia was noted (15 minutes total),  Nadir was 78% SpO2.    RECOMMENDATIONS: CPAP or BiPAP therapy are most helpful in REM  dependent sleep apnea.  Since the stay in the lab was rather difficult for this patient  and his spouse, I would like to start with an auto CPAP trial of  therapy, and review therapeutic data after 2-3 month of use.    CPAP autotitration device settings from 6-16 cm water, 3 cm EPR  and mask of patient's choice, under heated humidification.

## 2020-01-11 NOTE — Telephone Encounter (Signed)
I called pt and spoke with his wife. I advised pt that Dr. Brett Fairy reviewed their sleep study results and found that pt sleep apnea is present. Dr. Brett Fairy recommends that pt starts auto CPAP 6-16 cm water pressure. I reviewed PAP compliance expectations with the pt's wife. Pt's wife is agreeable to pt starting a CPAP. She is unsure if we can send to a DME locally or if it has to go through the [place].  A follow up appt was made for insurance purposes with Dr. Brett Fairy on Jan 3,2021 at 9:30 am. Pt verbalized understanding to arrive 15 minutes early and bring their CPAP. A letter with all of this information in it will be mailed to the pt as a reminder. I verified with the wife that the address we have on file is correct. Pt's wife verbalized understanding of results. Pt's wife had no questions at this time but was encouraged to call back if questions arise.  ** PT WIFE will call back to advise if the order has to go to the [place] for the patient to get the machine or if it can go to a local DME. If has to go through [place] she was going to give me fax number of where order should be sent for the patient.

## 2020-01-13 DIAGNOSIS — R001 Bradycardia, unspecified: Secondary | ICD-10-CM | POA: Insufficient documentation

## 2020-01-13 NOTE — Progress Notes (Signed)
Cardiology Office Note   Date:  01/17/2020   ID:  Edward Frank, DOB 12-29-1945, MRN [id number]  PCP:  [name], MD  Cardiologist:   Minus Breeding, MD    Chief Complaint  Patient presents with  . Leg Swelling      History of Present Illness: Edward Frank is a 74 y.o. male who presents for follow up of syncope.  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  He has had chronic leg edema and some dyspnea with activity.  He has ALS and gets around slowly and mostly in a motorized scooter.   After calling to discuss syncope I applied a monitor that demonstrated NSVT although he did not have symptoms with this.  He had a normal EF on echo in June 2020.    He is managed at [facility] for ALS. This has been progressive. He gets around with a walker and sometimes a scooter. Today he is just using a cane. He also had Covid and was treated with monoclonal antibody. He has been vaccinated. He is been diagnosed again with sleep apnea and is going to get CPAP titration. He is not had any of the syncope that he had previously. He wears compression stockings. He gets up slowly. He denies any chest pressure, neck or arm discomfort. Is not describing PND or orthopnea.    Past Medical History:  Diagnosis Date  . Arthritis   . Bradycardia   . Fatty liver   . Fracture, tibia, with fibula teenage yrs.    no surgery, wore cast  . GERD (gastroesophageal reflux disease)   . Hyperlipidemia   . Hypertension   . 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 OSA (obstructive sleep apnea) 01/22/2011  . PONV (postoperative nausea and vomiting)   . 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 Right bundle branch block   . Syncope     Past Surgical History:  Procedure Laterality Date  . CHOLECYSTECTOMY N/A 06/13/2016   Procedure: LAPAROSCOPIC CHOLECYSTECTOMY;  Surgeon: Arta Bruce Kinsinger, MD;  Location: [facility]  ORS;  Service: General;  Laterality: N/A;  . COLONOSCOPY WITH PROPOFOL N/A 08/02/2019   Procedure: COLONOSCOPY WITH PROPOFOL;  Surgeon: Daneil Dolin, MD;  Location: [facility] ENDO SUITE;  Service: Endoscopy;  Laterality: N/A;  10:00am  . EYE SURGERY     Cataracts bil  . FOOT SURGERY  11-2008   hammer toe and bunion  . HERNIA REPAIR  12-2001   inguinal hernia bilateral  . POLYPECTOMY  08/02/2019   Procedure: POLYPECTOMY;  Surgeon: Daneil Dolin, MD;  Location: [facility];  Service: Endoscopy;;  . REVERSE SHOULDER ARTHROPLASTY Left 05/28/2018   Procedure: REVERSE SHOULDER ARTHROPLASTY;  Surgeon: Justice Britain, MD;  Location: [facility] ORS;  Service: Orthopedics;  Laterality: Left;  171min  . SHOULDER ARTHROSCOPY WITH ROTATOR CUFF REPAIR AND SUBACROMIAL DECOMPRESSION Right 06/18/2012   Procedure: RIGHT SHOULDER ARTHROSCOPY WITH SUBACROMIAL DECOMPRESSION AND DISTAL CLAVICLE RESECTION AND ROTATOR CUFF REPAIR;  Surgeon: Marin Shutter, MD;  Location: [facility];  Service: Orthopedics;  Laterality: Right;  . SHOULDER SURGERY  1977   Luxating      Current Outpatient Medications  Medication Sig Dispense Refill  . Albuterol Sulfate (PROAIR RESPICLICK) 433 (90 Base) MCG/ACT AEPB Inhale 1-2 puffs into the lungs every 4 (four) hours as needed.    [name] allopurinol (ZYLOPRIM) 100 MG tablet Take  100 mg by mouth daily.    [name] aspirin EC 81 MG tablet Take 81 mg by mouth daily.    [name] azelastine (ASTELIN) 0.1 % nasal spray Place 2 sprays into both nostrils 2 (two) times daily. Use in each nostril as directed 30 mL 12  . Calcium Citrate-Vitamin D 315-250 MG-UNIT TABS Take 1 tablet by mouth daily.    . chlorthalidone (HYGROTON) 50 MG tablet Take 1 tablet (50 mg total) by mouth daily. 90 tablet 3  . Coenzyme Q10 (COQ10) 400 MG CAPS Take 400 mg by mouth daily.    . Cranberry 500 MG CAPS Take 500 mg by mouth 2 (two) times daily.     . Cyanocobalamin (VITAMIN B-12) 2500 MCG SUBL Place 2,500 mcg under the tongue daily.    [name] desonide  (DESOWEN) 0.05 % cream Apply 1 application topically 2 (two) times daily as needed (irritation).     . fluticasone (FLONASE) 50 MCG/ACT nasal spray Place 2 sprays into both nostrils daily.    . hydrocortisone (ANUSOL-HC) 2.5 % rectal cream Place 1 application rectally 2 (two) times daily as needed for hemorrhoids or anal itching.    . indomethacin (INDOCIN) 25 MG capsule Take 25 mg by mouth daily.     [name] losartan (COZAAR) 50 MG tablet Take 1 tablet by mouth once daily (Patient taking differently: Take 50 mg by mouth daily. ) 90 tablet 3  . methocarbamol (ROBAXIN) 750 MG tablet Take 750 mg by mouth 2 (two) times daily.     . Mometasone Furoate POWD 220 mcg by Does not apply route 2 (two) times daily. INHALE 2 PUFFS BY MOUTH BID    . Multiple Vitamin (MULTIVITAMIN WITH MINERALS) TABS Take 1 tablet by mouth daily.    . Omega-3 Fatty Acids (FISH OIL TRIPLE STRENGTH) 1400 MG CAPS Take 2,800 mg by mouth 2 (two) times daily.     [name] omeprazole (PRILOSEC) 20 MG capsule Take 40 mg by mouth daily.     [name] OVER THE COUNTER MEDICATION Take 1 tablet by mouth daily. Calcium citrate+D3 500-800 mg    . polyethylene glycol-electrolytes (TRILYTE) 420 g solution Take 4,000 mLs by mouth as directed. 4000 mL 0  . pravastatin (PRAVACHOL) 20 MG tablet TAKE 1 TABLET BY MOUTH AT BEDTIME (Patient taking differently: Take 20 mg by mouth at bedtime. ) 90 tablet 3  . RESVERATROL 100 MG CAPS Take 100 mg by mouth daily.     [name] tiZANidine (ZANAFLEX) 4 MG tablet Take 4 mg by mouth daily.    . traMADol (ULTRAM) 50 MG tablet Take 50 mg by mouth 2 (two) times daily.      No current facility-administered medications for this visit.    Allergies:   Gabapentin and Riluzole    ROS:  Please see the history of present illness.   Otherwise, review of systems are positive for none.   All other systems are reviewed and negative.    PHYSICAL EXAM: VS:  BP (!) 150/92   Pulse 75   Temp (!) 97.5 F (36.4 C)   Ht 5\' 10"  (1.778 m)   Wt 272  lb (123.4 kg)   SpO2 96%   BMI 39.03 kg/m  , BMI Body mass index is 39.03 kg/m.  GENERAL:  Well appearing NECK:  No jugular venous distention, waveform within normal limits, carotid upstroke brisk and symmetric, no bruits, no thyromegaly LUNGS:  Clear to auscultation bilaterally CHEST:  Unremarkable HEART:  PMI not displaced or sustained,S1 and S2  within normal limits, no S3, no S4, no clicks, no rubs, no murmurs ABD:  Flat, positive bowel sounds normal in frequency in pitch, no bruits, no rebound, no guarding, no midline pulsatile mass, no hepatomegaly, no splenomegaly EXT:  2 plus pulses throughout, no edema, no cyanosis no clubbing   EKG:  EKG is  ordered today. Sinus rhythm, rate 75, borderline right bundle branch block, right axis deviation, low voltage limb in chest leads. No significant change from previous.  Recent Labs: 12/02/2019: ALT 43; BUN 15; Creatinine, Ser 1.18; Potassium 4.3; Sodium 142    Lipid Panel    Component Value Date/Time   CHOL 151 12/02/2019 0842   TRIG 236.0 (H) 12/02/2019 0842   HDL 38.90 (L) 12/02/2019 0842   CHOLHDL 4 12/02/2019 0842   VLDL 47.2 (H) 12/02/2019 0842   LDLCALC 85 12/03/2018 0934   LDLDIRECT 70.0 12/02/2019 0842      Wt Readings from Last 3 Encounters:  01/17/20 272 lb (123.4 kg)  12/07/19 275 lb 8 oz (125 kg)  11/17/19 (!) 270 lb 8 oz (122.7 kg)      Other studies Reviewed: Additional studies/ records that were reviewed today included   None. Review of the above records demonstrates:  NA   ASSESSMENT AND PLAN:  EDEMA:    This is chronic and mild. No change in therapy.  HTN:   The blood pressure is elevated. However, he had a blood pressure cuff today and is going to check this at home. We correlated the readings from this machine with ours and taught him how to use it. For now no change in therapy.  AAA:  3.6 cm on CT in Feb of 2019. No further imaging.  BRADYCARDIA:     He has had no further syncope. His heart rate  today is fine. He had some nonsustained VT on a Holter before but no symptoms related to this. No further work-up.  COVID EDUCATION: He has had the virus and been vaccinated. He should also get the booster.   Current medicines are reviewed at length with the patient today.  The patient does not have concerns regarding medicines.  The following changes have been made:   None Labs/ tests ordered today include: None  Orders Placed This Encounter  Procedures  . EKG 12-Lead     Disposition:   FU with me in one year.  Signed, Minus Breeding, MD  01/17/2020 11:27 AM    [facility]

## 2020-01-17 ENCOUNTER — Encounter: Payer: Self-pay | Admitting: Cardiology

## 2020-01-17 ENCOUNTER — Ambulatory Visit (INDEPENDENT_AMBULATORY_CARE_PROVIDER_SITE_OTHER): Payer: Medicare Other | Admitting: Cardiology

## 2020-01-17 ENCOUNTER — Other Ambulatory Visit: Payer: Self-pay

## 2020-01-17 VITALS — BP 150/92 | HR 75 | Temp 97.5°F | Ht 70.0 in | Wt 272.0 lb

## 2020-01-17 DIAGNOSIS — Z7189 Other specified counseling: Secondary | ICD-10-CM | POA: Diagnosis not present

## 2020-01-17 DIAGNOSIS — I714 Abdominal aortic aneurysm, without rupture, unspecified: Secondary | ICD-10-CM

## 2020-01-17 DIAGNOSIS — R001 Bradycardia, unspecified: Secondary | ICD-10-CM

## 2020-01-17 DIAGNOSIS — M7989 Other specified soft tissue disorders: Secondary | ICD-10-CM | POA: Diagnosis not present

## 2020-01-17 DIAGNOSIS — I10 Essential (primary) hypertension: Secondary | ICD-10-CM | POA: Diagnosis not present

## 2020-01-17 NOTE — Patient Instructions (Signed)
Medication Instructions:  No changes *If you need a refill on your cardiac medications before your next appointment, please call your pharmacy*   Lab Work: None If you have labs (blood work) drawn today and your tests are completely normal, you will receive your results only by: [name] MyChart Message (if you have MyChart) OR . A paper copy in the mail If you have any lab test that is abnormal or we need to change your treatment, we will call you to review the results.   Testing/Procedures: None   Follow-Up: At [facility], you and your health needs are our priority.  As part of our continuing mission to provide you with exceptional heart care, we have created designated Provider Care Teams.  These Care Teams include your primary Cardiologist (physician) and Advanced Practice Providers (APPs -  Physician Assistants and Nurse Practitioners) who all work together to provide you with the care you need, when you need it.  We recommend signing up for the patient portal called "MyChart".  Sign up information is provided on this After Visit Summary.  MyChart is used to connect with patients for Virtual Visits (Telemedicine).  Patients are able to view lab/test results, encounter notes, upcoming appointments, etc.  Non-urgent messages can be sent to your provider as well.   To learn more about what you can do with MyChart, go to [email or web address].    Your next appointment:   1 year(s)  The format for your next appointment:   In Person  Provider:   Minus Breeding, MD

## 2020-01-18 NOTE — Telephone Encounter (Signed)
Wife(on DPR) has called back to inform Casey,RN that she has not forgotten re: the CPAP, she is still waiting on the [place].

## 2020-02-22 NOTE — Telephone Encounter (Addendum)
The wife has contacted to advise that the patient has not been set up on the machine. They have yet to hear from anyone. Advised I will send the order and information to the 3 fax numbers I have for [facility]. Advised I will contact her back in the morning and provide her with the numbers that I have so she may be able to provide with those numbers for when she calls them. As of this moment no follow apt made. The patient also requested a copy of sleep study be mailed to them. Address verified on file. Informed her I would do so. She was appreciative for me taking the call and trying to work with her.

## 2020-02-23 NOTE — Telephone Encounter (Signed)
Called the wife and spoke with her. Advised I have faxed the [place] numbers that I have. I provided her with the  [facility] phone # I have listed and informed her I have called and never got a call back  Phone: [phone] ext [phone] [phone]  The only other 2 fax #'s I have are [phone] and [phone] and I am unsure where they technically go at the [place]. Informed her that she can at least have the information that I have sent all the information that should be needed to get him set up with a auto CPAP. I received confirmation that fax went through on all 3 #'s. She verbalized understanding and had no other questions or concern.

## 2020-03-09 ENCOUNTER — Other Ambulatory Visit (INDEPENDENT_AMBULATORY_CARE_PROVIDER_SITE_OTHER): Payer: Medicare Other

## 2020-03-09 ENCOUNTER — Other Ambulatory Visit: Payer: Self-pay

## 2020-03-09 DIAGNOSIS — Z125 Encounter for screening for malignant neoplasm of prostate: Secondary | ICD-10-CM

## 2020-03-09 DIAGNOSIS — E7800 Pure hypercholesterolemia, unspecified: Secondary | ICD-10-CM

## 2020-03-09 LAB — LIPID PANEL
Cholesterol: 144 mg/dL (ref 0–200)
HDL: 37 mg/dL — ABNORMAL LOW (ref >39.00)
NonHDL: 107.24
Total CHOL/HDL Ratio: 4
Triglycerides: 225 mg/dL — ABNORMAL HIGH (ref 0.0–149.0)
VLDL: 45 mg/dL — ABNORMAL HIGH (ref 0.0–40.0)

## 2020-03-09 LAB — PSA, MEDICARE: PSA: 1.61 ng/ml (ref 0.10–4.00)

## 2020-03-09 LAB — LDL CHOLESTEROL, DIRECT: Direct LDL: 75 mg/dL

## 2020-03-09 NOTE — Progress Notes (Signed)
No critical labs need to be addressed urgently. We will discuss labs in detail at upcoming office visit.   

## 2020-03-23 ENCOUNTER — Telehealth: Payer: Self-pay

## 2020-03-23 NOTE — Telephone Encounter (Signed)
[facility] - Client TELEPHONE ADVICE RECORD [facility] Patient Name: Edward Frank Gender: Male DOB: 01-11-1946 Age: 74 Y 2 M 22 D Return Phone Number: [phone] (Primary) Address: City/State/Zip: [place] [address] Client [facility] - Client Client Site [place] - Day Physician Eliezer Lofts - MD Contact Type Call Who Is Calling Patient / Member / Family / Caregiver Call Type Triage / Clinical Caller Name Neoma Laming Relationship To Patient Spouse Return Phone Number [phone] (Primary) Chief Complaint BREATHING - fast, heavy or wheezing Reason for Call Symptomatic / Request for Health Information Initial Comment Caller states patient has cough with wheezing [place] Not Listed [place] Translation No Nurse Assessment Nurse: Raphael Gibney, RN, Vanita Ingles Date/Time (Eastern Time): 03/23/2020 1:07:24 PM Confirm and document reason for call. If symptomatic, describe symptoms. ---Caller states spouse has a cough with wheezing. he is on albuterol and Asmanex which are not helping the wheezing. no fever. Cough started this week. he is using albuterol qid. oxygen 93-95%. Does the patient have any new or worsening symptoms? ---Yes Will a triage be completed? ---Yes Related visit to physician within the last 2 weeks? ---No Does the PT have any chronic conditions? (i.e. diabetes, asthma, this includes High risk factors for pregnancy, etc.) ---Yes List chronic conditions. ---Leverne Humbles disease Is this a behavioral health or substance abuse call? ---No Guidelines Guideline Title Affirmed Question Affirmed Notes Nurse Date/Time (Eastern Time) Cough - Acute Productive [1] MODERATE difficulty breathing (e.g., speaks in phrases, SOB even at rest, pulse 100-120) AND [2] still present when not coughing Raphael Gibney, RN, [place] 03/23/2020 1:14:06 PM Disp. Time Eilene Ghazi Time) Disposition Final User 03/23/2020  1:06:32 PM Send to Urgent Lesli Albee, [place] NOTE: All timestamps contained within this report are represented as [place] Standard Time. CONFIDENTIALTY NOTICE: This fax transmission is intended only for the addressee. It contains information that is legally privileged, confidential or otherwise protected from use or disclosure. If you are not the intended recipient, you are strictly prohibited from reviewing, disclosing, copying using or disseminating any of this information or taking any action in reliance on or regarding this information. If you have received this fax in error, please notify us immediately by telephone so that we can arrange for its return to [place]. Phone: [phone], Toll-Free: [phone], Fax: [phone] Page: 2 of 2 Call Id: [id number] 03/23/2020 1:19:18 PM Go to ED Now Yes Raphael Gibney, RN, Doreatha Lew Disagree/Comply Comply Caller Understands Yes PreDisposition Call Doctor Care Advice Given Per Guideline * You need to be seen in the Emergency Department. GO TO ED NOW: ANOTHER ADULT SHOULD DRIVE: * It is better and safer if another adult drives instead of you. Comments User: Dannielle Burn, RN Date/Time Eilene Ghazi Time): 03/23/2020 1:18:54 PM please call spouse regarding results of PSA and his lab work that was done on 03/09/20. User: Dannielle Burn, RN Date/Time Eilene Ghazi Time): 03/23/2020 1:19:52 PM spouse states she does not know if spouse will go to the ER or not but she will try to persuade him Referrals GO TO FACILITY OTHER - SPECIFY

## 2020-03-23 NOTE — Telephone Encounter (Signed)
Noted  

## 2020-03-23 NOTE — Telephone Encounter (Signed)
I spoke with Mrs Anding; she is going to try to get pt to go to [facility] on 03/24/20. Pt wheezing is helped by albuterol but it does not help the cough and Mrs Dykema said that tussionex is the only thing that helps pt's cough. Mrs Paxton will ask the UC for tussionex because has several cough meds at home that do not help. Mrs Sane said if she can not get tussionex for pt at [facility] she may call and see if Dr Diona Browner would consider prescribing after pt is seen and evaluated. Also pt's wife request cb for lab results for labs done on 03/09/20. Pt does not have upcoming appt until 12/08/2020.UC & ED precautions given and Mrs Scherzer voiced understanding. Sending note to Dr Diona Browner.

## 2020-04-24 ENCOUNTER — Ambulatory Visit: Payer: Self-pay | Admitting: Neurology

## 2020-08-08 DIAGNOSIS — G1223 Primary lateral sclerosis: Secondary | ICD-10-CM | POA: Diagnosis not present

## 2020-08-08 DIAGNOSIS — R252 Cramp and spasm: Secondary | ICD-10-CM | POA: Diagnosis not present

## 2020-08-08 DIAGNOSIS — M7918 Myalgia, other site: Secondary | ICD-10-CM | POA: Diagnosis not present

## 2020-08-08 DIAGNOSIS — G5622 Lesion of ulnar nerve, left upper limb: Secondary | ICD-10-CM | POA: Diagnosis not present

## 2020-11-01 ENCOUNTER — Encounter: Payer: Self-pay | Admitting: Family Medicine

## 2020-11-01 ENCOUNTER — Telehealth (INDEPENDENT_AMBULATORY_CARE_PROVIDER_SITE_OTHER): Payer: Medicare Other | Admitting: Family Medicine

## 2020-11-01 DIAGNOSIS — G1221 Amyotrophic lateral sclerosis: Secondary | ICD-10-CM | POA: Diagnosis not present

## 2020-11-01 DIAGNOSIS — J22 Unspecified acute lower respiratory infection: Secondary | ICD-10-CM | POA: Diagnosis not present

## 2020-11-01 MED ORDER — HYDROCOD POLST-CPM POLST ER 10-8 MG/5ML PO SUER
5.0000 mL | Freq: Two times a day (BID) | ORAL | 0 refills | Status: DC | PRN
Start: 2020-11-01 — End: 2020-11-10

## 2020-11-01 MED ORDER — AZITHROMYCIN 250 MG PO TABS: ORAL_TABLET | ORAL | 0 refills | Status: DC

## 2020-11-01 NOTE — Assessment & Plan Note (Addendum)
Anticipate acute bronchitis. Not consistent with COVID-19 infection as rest of family has not gotten sick. Already 2 wks into illness, don't see need for COVID swab. Will cover for atypical bronchitis given h/o PLS with azithromycin.  Rx tussionex pennkinetic cough syrup which is effective for him.  Further supportive care measures at home.  Update if not improving with treatment.

## 2020-11-01 NOTE — Progress Notes (Signed)
Edward Frank - 75 y.o. male  MRN [id number]  Date of Birth: 1945/08/23  PCP: [name], MD  This service was provided via telemedicine. Phone Visit performed on 11/01/2020    Rationale for phone visit along with limitations reviewed. I discussed the limitations, risks, security and privacy concerns of performing a phone visit and the availability of in person appointments. I also discussed with the patient that there may be a patient responsible charge related to this service. Patient consented to telephone encounter.    Location of patient: at home Location of provider: in office, [place] @ [facility] Name of referring provider: N/A   Names of persons and role in encounter: Provider: Ria Bush, MD  Patient: Edward Frank  Other: wife also present    Time on call: 9:34am - 9:46am Time spent reviewing records: 7 min   Subjective: Chief Complaint  Patient presents with   Cough   Nasal Congestion     HPI:  Wet sounding productive cough with coughing fits worse at night that started 10/20/2020. Chest > head congestion. Some wheezing. Chronic dyspnea, unchanged. Initial HA and ST, now resolved.  No fevers/chills, ear or tooth pain, abd pain, nausea, diarrhea, loss of taste or smell, ST or PNdrainage, body aches.  Gets bronchitis 2-3 times a year.  Has tried robitussin at home without benefit, tylenol. Request tussionex which is effective and tolerated well. He doesn't receive benefit from other cough medications.  Didn't get COVID tested.   Did have COVID 02/2019 treated with mAb infusion, symptoms fully resolved.   Risk factors include hypertension, OSA on CPAP (new machine), primary lateral sclerosis neuromuscular disorder followed by [facility] neurology Dr Sabra Heck last seen 07/2020. Also has seen [facility]. Uses walker and scooter for mobility.   No h/o asthma or COPD, no smoking history. They live in [place], [place].   S/p COVID vaccine Moderna  06/2019 x2. No boosters.    Objective/Observations:  No physical exam or vital signs collected unless specifically identified below.   BP 108/68   Pulse 85   Temp (!) 97.2 F (36.2 C) (Oral)   Ht 5\' 10"  (1.778 m)   Wt 275 lb (124.7 kg)   SpO2 92%   BMI 39.46 kg/m    Respiratory status: speaks in complete sentences without evident shortness of breath. Coughing fits present.   Assessment/Plan:  Acute respiratory infection Anticipate acute bronchitis. Not consistent with COVID-19 infection as rest of family has not gotten sick. Already 2 wks into illness, don't see need for COVID swab. Will cover for atypical bronchitis given h/o PLS with azithromycin.  Rx tussionex pennkinetic cough syrup which is effective for him.  Further supportive care measures at home.  Update if not improving with treatment.   ALS (amyotrophic lateral sclerosis) ([place]) Followed by neurology. This increases risk of superinfection.    I discussed the assessment and treatment plan with the patient. The patient was provided an opportunity to ask questions and all were answered. The patient agreed with the plan and demonstrated an understanding of the instructions.  Lab Orders  No laboratory test(s) ordered today    Meds ordered this encounter  Medications   chlorpheniramine-HYDROcodone (TUSSIONEX PENNKINETIC ER) 10-8 MG/5ML SUER    Sig: Take 5 mLs by mouth every 12 (twelve) hours as needed for cough.    Dispense:  120 mL    Refill:  0   azithromycin (ZITHROMAX) 250 MG tablet    Sig: Take two tablets  on day one followed by one tablet on days 2-5    Dispense:  6 each    Refill:  0     The patient was advised to call back or seek an in-person evaluation if the symptoms worsen or if the condition fails to improve as anticipated.  Ria Bush, MD

## 2020-11-01 NOTE — Assessment & Plan Note (Signed)
Followed by neurology. This increases risk of superinfection.

## 2020-11-02 ENCOUNTER — Telehealth: Payer: Self-pay | Admitting: Radiology

## 2020-11-02 NOTE — Telephone Encounter (Signed)
Dr Danise Mina requested an update on the patient, wife said, he hasn't had any change , medication was picked up late. Patient had a bad coughing spell lasting about 10 minutes prior to medication.

## 2020-11-02 NOTE — Telephone Encounter (Signed)
Noted thank you

## 2020-11-09 ENCOUNTER — Telehealth: Payer: Self-pay | Admitting: Family Medicine

## 2020-11-09 NOTE — Telephone Encounter (Signed)
Glad he's improving.  Would be reasonable to refill cough syrup if needed x1 more time. Let us know if interested.

## 2020-11-09 NOTE — Telephone Encounter (Signed)
Mrs. Cardosa called in due to Mr. Kennie saw Dr. Darnell Level and he was prescribed 2 medications. And she stated that he is improving the coughing is still there but not as bad.

## 2020-11-10 MED ORDER — HYDROCOD POLST-CPM POLST ER 10-8 MG/5ML PO SUER
5.0000 mL | Freq: Two times a day (BID) | ORAL | 0 refills | Status: DC | PRN
Start: 2020-11-10 — End: 2020-12-08

## 2020-11-10 NOTE — Addendum Note (Signed)
Addended by: Ria Bush on: 11/10/2020 04:02 PM   Modules accepted: Orders

## 2020-11-10 NOTE — Telephone Encounter (Signed)
Spoke with pt relaying Dr. Synthia Innocent message.  Pt agrees to another refill for cough med.

## 2020-11-10 NOTE — Telephone Encounter (Signed)
Refilled

## 2020-12-01 ENCOUNTER — Other Ambulatory Visit (INDEPENDENT_AMBULATORY_CARE_PROVIDER_SITE_OTHER): Payer: Medicare Other

## 2020-12-01 ENCOUNTER — Other Ambulatory Visit: Payer: Self-pay

## 2020-12-01 ENCOUNTER — Telehealth: Payer: Self-pay | Admitting: Family Medicine

## 2020-12-01 DIAGNOSIS — R7303 Prediabetes: Secondary | ICD-10-CM

## 2020-12-01 DIAGNOSIS — Z125 Encounter for screening for malignant neoplasm of prostate: Secondary | ICD-10-CM

## 2020-12-01 DIAGNOSIS — E7800 Pure hypercholesterolemia, unspecified: Secondary | ICD-10-CM | POA: Diagnosis not present

## 2020-12-01 LAB — COMPREHENSIVE METABOLIC PANEL
ALT: 35 U/L (ref 0–53)
AST: 26 U/L (ref 0–37)
Albumin: 4.4 g/dL (ref 3.5–5.2)
Alkaline Phosphatase: 49 U/L (ref 39–117)
BUN: 16 mg/dL (ref 6–23)
CO2: 26 mEq/L (ref 19–32)
Calcium: 9.7 mg/dL (ref 8.4–10.5)
Chloride: 106 mEq/L (ref 96–112)
Creatinine, Ser: 1.01 mg/dL (ref 0.40–1.50)
GFR: 72.03 mL/min (ref >60.00)
Glucose, Bld: 95 mg/dL (ref 70–99)
Potassium: 4.4 mEq/L (ref 3.5–5.1)
Sodium: 141 mEq/L (ref 135–145)
Total Bilirubin: 0.5 mg/dL (ref 0.2–1.2)
Total Protein: 6.7 g/dL (ref 6.0–8.3)

## 2020-12-01 LAB — LDL CHOLESTEROL, DIRECT: Direct LDL: 79 mg/dL

## 2020-12-01 LAB — LIPID PANEL
Cholesterol: 160 mg/dL (ref 0–200)
HDL: 42.1 mg/dL (ref >39.00)
NonHDL: 118.12
Total CHOL/HDL Ratio: 4
Triglycerides: 231 mg/dL — ABNORMAL HIGH (ref 0.0–149.0)
VLDL: 46.2 mg/dL — ABNORMAL HIGH (ref 0.0–40.0)

## 2020-12-01 LAB — HEMOGLOBIN A1C: Hgb A1c MFr Bld: 5.9 % (ref 4.6–6.5)

## 2020-12-01 LAB — PSA, MEDICARE: PSA: 2.07 ng/ml (ref 0.10–4.00)

## 2020-12-01 NOTE — Telephone Encounter (Signed)
-----   Message from Ellamae Sia sent at 11/13/2020  9:40 AM EDT ----- Regarding: Lab orders for Friday, 8.12.22 Patient is scheduled for CPX labs, please order future labs, Thanks , Karna Christmas

## 2020-12-01 NOTE — Progress Notes (Signed)
No critical labs need to be addressed urgently. We will discuss labs in detail at upcoming office visit.   

## 2020-12-08 ENCOUNTER — Other Ambulatory Visit: Payer: Self-pay

## 2020-12-08 ENCOUNTER — Ambulatory Visit (INDEPENDENT_AMBULATORY_CARE_PROVIDER_SITE_OTHER): Payer: Medicare Other | Admitting: Family Medicine

## 2020-12-08 ENCOUNTER — Encounter: Payer: Self-pay | Admitting: Family Medicine

## 2020-12-08 VITALS — HR 74 | Temp 98.3°F | Ht 71.0 in | Wt 271.5 lb

## 2020-12-08 DIAGNOSIS — S0083XA Contusion of other part of head, initial encounter: Secondary | ICD-10-CM | POA: Diagnosis not present

## 2020-12-08 DIAGNOSIS — I10 Essential (primary) hypertension: Secondary | ICD-10-CM | POA: Diagnosis not present

## 2020-12-08 DIAGNOSIS — W19XXXA Unspecified fall, initial encounter: Secondary | ICD-10-CM | POA: Diagnosis not present

## 2020-12-08 DIAGNOSIS — G1221 Amyotrophic lateral sclerosis: Secondary | ICD-10-CM

## 2020-12-08 DIAGNOSIS — G1223 Primary lateral sclerosis: Secondary | ICD-10-CM

## 2020-12-08 DIAGNOSIS — Z0000 Encounter for general adult medical examination without abnormal findings: Secondary | ICD-10-CM | POA: Diagnosis not present

## 2020-12-08 DIAGNOSIS — R7303 Prediabetes: Secondary | ICD-10-CM

## 2020-12-08 DIAGNOSIS — E7800 Pure hypercholesterolemia, unspecified: Secondary | ICD-10-CM

## 2020-12-08 NOTE — Progress Notes (Signed)
Patient ID: Edward Frank, male    DOB: 1945-09-29, 75 y.o.   MRN: [id number]  This visit was conducted in person.  Pulse 74   Temp 98.3 F (36.8 C) (Temporal)   Ht '5\' 11"'$  (1.803 m)   Wt 271 lb 8 oz (123.2 kg)   SpO2 98%   BMI 37.87 kg/m    CC: Chief Complaint  Patient presents with   Medicare Wellness    Subjective:   HPI: Edward Frank is a 75 y.o. male presenting on 12/08/2020 for Medicare Wellness  The patient presents for annual medicare wellness, complete physical and review of chronic health problems. He/She also has the following acute concerns today:  He fell in garden yesterday.[name] lost balance  Hit right side  No LOC.  Has  bruising on right side face, right elbow, right hip contusion. No hip pain with movement.  Back always hurts  I have personally reviewed the Medicare Annual Wellness questionnaire and have noted 1. [name] medical and social history 2. Their use of alcohol, tobacco or illicit drugs 3. Their current medications and supplements 4. [name] functional ability including ADL's, fall risks, home safety risks and hearing or visual             impairment. 5. Diet and physical activities 6. Evidence for depression or mood disorders 7.         Updated provider list Cognitive evaluation was performed and recorded on pt medicare questionnaire form. The patients weight, height, BMI and visual acuity have been recorded in the chart   I have made referrals, counseling and provided education to the patient based review of the above and I have provided the pt with a written personalized care plan for preventive services.   Documentation of this information was scanned into the electronic record under the media tab.   Advance directives and end of life planning reviewed in detail with patient and documented in EMR. Patient given handout on advance care directives if needed. HCPOA and living will updated if needed.  Hearing Screening   Method: Audiometry   '500Hz'$  '1000Hz'$  '2000Hz'$  '4000Hz'$   Right ear 0 25 20 0  Left ear 0 40 20 0  Vision Screening - Comments:: Eye Exam at [place] in [place] 02/2020   Fall Risk  12/08/2020 12/07/2019 12/04/2018 11/25/2017 11/22/2016  Falls in the past year? '1 1 1 '$ No Yes  Comment - - - - -  Number falls in past yr: '1 1 1 '$ - 2 or more  Comment - - - - -  Injury with Fall? '1 1 1 '$ - No  Risk Factor Category  - - - - -  Risk for fall due to : - - - - -   [place] Office Visit from 12/08/2020 in [place] at [place] Total Score 0       PLS/upper motor dominant AS: Continue slow progression change.  Was followed at [facility]. Now followed by Dr. Nicoletta Dress   at [facility]. He has walker and cane.  less endurance, weaker over all. 12/05/2020 OV Dr. Nicoletta Dress reviewed re: oropharyngeal dysphagia.[name] referred to speech therapy  Hypertension:  Low normal BP in office today on losartan 50 mg daily BP Readings from Last 3 Encounters:  11/01/20 108/68  01/17/20 (!) 150/92  12/07/19 126/74  Using medication without problems or lightheadedness: none Chest pain with exertion:none Edema:none Short of breath:none Average home BPs: Other issues:  Elevated Cholesterol:  LDL at goal  on pravastatin Lab Results  Component Value Date   CHOL 160 12/01/2020   HDL 42.10 12/01/2020   LDLCALC 85 12/03/2018   LDLDIRECT 79.0 12/01/2020   TRIG 231.0 (H) 12/01/2020   CHOLHDL 4 12/01/2020  Using medications without problems: Muscle aches:  Diet compliance: some fried foods Exercise: limited given PLS Other complaints: bradycardia.[name]  no further syncope, no further workup needed per cardiology note 12/2019  Prediabetes  Lab Results  Component Value Date   HGBA1C 5.9 12/01/2020     AAA:  3.6 cm on CT in Feb of 2019.   Reviewed Cardiology note 12/2019.[name] upcoming in 1 months.     Seeing Dr. Nelva Bush for [facility] for low back pain.. has follow up 12/2020  Relevant past medical, surgical, family and social history reviewed  and updated as indicated. Interim medical history since our last visit reviewed. Allergies and medications reviewed and updated. Outpatient Medications Prior to Visit  Medication Sig Dispense Refill   Albuterol Sulfate (PROAIR RESPICLICK) 123XX123 (90 Base) MCG/ACT AEPB Inhale 1-2 puffs into the lungs every 4 (four) hours as needed.     allopurinol (ZYLOPRIM) 100 MG tablet Take 100 mg by mouth daily.     ascorbic acid (VITAMIN C) 1000 MG tablet Take 1,000 mg by mouth daily.     aspirin EC 81 MG tablet Take 81 mg by mouth daily.     azelastine (ASTELIN) 0.1 % nasal spray Place 2 sprays into both nostrils 2 (two) times daily. Use in each nostril as directed 30 mL 12   chlorthalidone (HYGROTON) 50 MG tablet Take 1 tablet (50 mg total) by mouth daily. 90 tablet 3   Coenzyme Q10 (COQ10) 400 MG CAPS Take 400 mg by mouth daily.     Cranberry 500 MG CAPS Take 500 mg by mouth 2 (two) times daily.      Cyanocobalamin (VITAMIN B-12) 2500 MCG SUBL Place 2,500 mcg under the tongue daily.     desonide (DESOWEN) 0.05 % cream Apply 1 application topically 2 (two) times daily as needed (irritation).      fluticasone (FLONASE) 50 MCG/ACT nasal spray Place 2 sprays into both nostrils daily.     hydrocortisone (ANUSOL-HC) 2.5 % rectal cream Place 1 application rectally 2 (two) times daily as needed for hemorrhoids or anal itching.     indomethacin (INDOCIN) 25 MG capsule Take 25 mg by mouth daily.      losartan (COZAAR) 50 MG tablet Take 1 tablet by mouth once daily 90 tablet 3   methocarbamol (ROBAXIN) 750 MG tablet Take 750 mg by mouth 2 (two) times daily.      Mometasone Furoate POWD 220 mcg by Does not apply route 2 (two) times daily. INHALE 2 PUFFS BY MOUTH BID     Multiple Vitamin (MULTIVITAMIN WITH MINERALS) TABS Take 1 tablet by mouth daily.     Omega-3 Fatty Acids (FISH OIL TRIPLE STRENGTH) 1400 MG CAPS Take 2,800 mg by mouth 2 (two) times daily.      omeprazole (PRILOSEC) 20 MG capsule Take 40 mg by mouth  daily.      OVER THE COUNTER MEDICATION Take 1 tablet by mouth daily. Calcium citrate+D3 500-800 mg     pravastatin (PRAVACHOL) 20 MG tablet TAKE 1 TABLET BY MOUTH AT BEDTIME 90 tablet 3   RESVERATROL 100 MG CAPS Take 100 mg by mouth daily.      tiZANidine (ZANAFLEX) 4 MG tablet Take 4 mg by mouth daily.     traMADol (ULTRAM) 50  MG tablet Take 50 mg by mouth 2 (two) times daily.     zinc gluconate 50 MG tablet Take 50 mg by mouth daily.     Calcium Citrate-Vitamin D 315-250 MG-UNIT TABS Take 1 tablet by mouth daily.     Cholecalciferol 125 MCG (5000 UT) capsule Take by mouth.     azithromycin (ZITHROMAX) 250 MG tablet Take two tablets on day one followed by one tablet on days 2-5 6 each 0   chlorpheniramine-HYDROcodone (TUSSIONEX PENNKINETIC ER) 10-8 MG/5ML SUER Take 5 mLs by mouth every 12 (twelve) hours as needed for cough. 120 mL 0   polyethylene glycol-electrolytes (TRILYTE) 420 g solution Take 4,000 mLs by mouth as directed. 4000 mL 0   No facility-administered medications prior to visit.     Per HPI unless specifically indicated in ROS section below Review of Systems  Constitutional:  Positive for fatigue. Negative for fever.  HENT:  Negative for ear pain.   Eyes:  Negative for pain.  Respiratory:  Negative for cough and shortness of breath.   Cardiovascular:  Negative for chest pain, palpitations and leg swelling.  Gastrointestinal:  Negative for abdominal pain.  Genitourinary:  Negative for dysuria.  Musculoskeletal:  Negative for arthralgias.  Neurological:  Negative for syncope, light-headedness and headaches.  Psychiatric/Behavioral:  Positive for sleep disturbance. Negative for behavioral problems and dysphoric mood. The patient is not nervous/anxious.    Some stool incontinence.[name] given PLS, Objective:  Pulse 74   Temp 98.3 F (36.8 C) (Temporal)   Ht '5\' 11"'$  (1.803 m)   Wt 271 lb 8 oz (123.2 kg)   SpO2 98%   BMI 37.87 kg/m   Wt Readings from Last 3 Encounters:   12/08/20 271 lb 8 oz (123.2 kg)  11/01/20 275 lb (124.7 kg)  01/17/20 272 lb (123.4 kg)      Physical Exam Constitutional:      General: He is not in acute distress.    Appearance: Normal appearance. He is well-developed. He is not ill-appearing or toxic-appearing.  HENT:     Head: Normocephalic and atraumatic.     Right Ear: Hearing, tympanic membrane, ear canal and external ear normal.     Left Ear: Hearing, tympanic membrane, ear canal and external ear normal.     Nose: Nose normal.     Mouth/Throat:     Pharynx: Uvula midline.  Eyes:     General: Lids are normal. Lids are everted, no foreign bodies appreciated.     Conjunctiva/sclera: Conjunctivae normal.     Pupils: Pupils are equal, round, and reactive to light.  Neck:     Thyroid: No thyroid mass or thyromegaly.     Vascular: No carotid bruit.     Trachea: Trachea and phonation normal.  Cardiovascular:     Rate and Rhythm: Normal rate and regular rhythm.     Pulses: Normal pulses.     Heart sounds: S1 normal and S2 normal. No murmur heard.   No gallop.  Pulmonary:     Breath sounds: Normal breath sounds. No wheezing, rhonchi or rales.  Abdominal:     General: Bowel sounds are normal.     Palpations: Abdomen is soft.     Tenderness: There is no abdominal tenderness. There is no guarding or rebound.     Hernia: No hernia is present.  Musculoskeletal:     Cervical back: Normal range of motion and neck supple.  Lymphadenopathy:     Cervical: No cervical adenopathy.  Skin:  General: Skin is warm and dry.     Findings: No rash.  Neurological:     Mental Status: He is alert and oriented to person, place, and time.     Cranial Nerves: Cranial nerves are intact. No cranial nerve deficit.     Sensory: No sensory deficit.     Motor: Weakness and abnormal muscle tone present.     Coordination: Coordination abnormal. Finger-Nose-Finger Test abnormal and Heel to [facility] Test abnormal.     Gait: Gait abnormal and tandem walk  abnormal.     Deep Tendon Reflexes: Reflexes are normal and symmetric.  Psychiatric:        Speech: Speech normal.        Behavior: Behavior normal.        Judgment: Judgment normal.      Results for orders placed or performed in visit on 12/01/20  PSA, Medicare  Result Value Ref Range   PSA 2.07 0.10 - 4.00 ng/ml  Comprehensive metabolic panel  Result Value Ref Range   Sodium 141 135 - 145 mEq/L   Potassium 4.4 3.5 - 5.1 mEq/L   Chloride 106 96 - 112 mEq/L   CO2 26 19 - 32 mEq/L   Glucose, Bld 95 70 - 99 mg/dL   BUN 16 6 - 23 mg/dL   Creatinine, Ser 1.01 0.40 - 1.50 mg/dL   Total Bilirubin 0.5 0.2 - 1.2 mg/dL   Alkaline Phosphatase 49 39 - 117 U/L   AST 26 0 - 37 U/L   ALT 35 0 - 53 U/L   Total Protein 6.7 6.0 - 8.3 g/dL   Albumin 4.4 3.5 - 5.2 g/dL   GFR 72.03 >60.00 mL/min   Calcium 9.7 8.4 - 10.5 mg/dL  Lipid panel  Result Value Ref Range   Cholesterol 160 0 - 200 mg/dL   Triglycerides 231.0 (H) 0.0 - 149.0 mg/dL   HDL 42.10 >39.00 mg/dL   VLDL 46.2 (H) 0.0 - 40.0 mg/dL   Total CHOL/HDL Ratio 4    NonHDL 118.12   Hemoglobin A1c  Result Value Ref Range   Hgb A1c MFr Bld 5.9 4.6 - 6.5 %  LDL cholesterol, direct  Result Value Ref Range   Direct LDL 79.0 mg/dL    This visit occurred during the SARS-CoV-2 public health emergency.  Safety protocols were in place, including screening questions prior to the visit, additional usage of staff PPE, and extensive cleaning of exam room while observing appropriate contact time as indicated for disinfecting solutions.   COVID 19 screen:  No recent travel or known exposure to COVID19 The patient denies respiratory symptoms of COVID 19 at this time. The importance of social distancing was discussed today.   Assessment and Plan [name] preventative maintenance and recommended screening tests for an annual wellness exam were reviewed in full today. Brought up to date unless services declined.  Counselled on the importance  of diet, exercise, and its role in overall health and mortality. [name] FH and SH was reviewed, including their home life, tobacco status, and drug and alcohol status.   Vaccines:  Uptodate pneumovax/prevnar and  shingles x 2  ,tdap, COVID x 2 Prostate: Father with prostate cancer age 78s.   Discussed in detail, have chose to check PSA and rectal exam yearly. Lab Results  Component Value Date   PSA 2.07 12/01/2020   PSA 1.61 03/09/2020   PSA 1.76 12/03/2018  Colon: Sister with colon [facility], 07/2019, likely nonsmoker Hep C  screening: done     Problem List Items Addressed This Visit     RESOLVED: ALS (amyotrophic lateral sclerosis) ([place])    Progressive followed  by neurology      Facial contusion, initial encounter    Acute, improving  Normal neuro exam today   Given head injury: Call or go to ER if any new neuro changes, headache starting, or confusion following head injury.      Fall    Reviewed fall precautions. Fall due to neurologic disease.      HYPERCHOLESTEROLEMIA    Stable, chronic.  Continue current medication.    pravastatin 20 mg daily      HYPERTENSION, MILD    Stable, chronic.  Continue current medication.   losartan 50 mg daily      Prediabetes    Stable control Encouraged exercise as tolerated, weight loss, healthy eating habits.       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

## 2020-12-08 NOTE — Assessment & Plan Note (Signed)
Progressive followed  by neurology

## 2020-12-08 NOTE — Assessment & Plan Note (Signed)
Stable, chronic.  Continue current medication.  losartan 50 mg daily   

## 2020-12-08 NOTE — Assessment & Plan Note (Signed)
Stable control Encouraged exercise as tolerated, weight loss, healthy eating habits.

## 2020-12-08 NOTE — Assessment & Plan Note (Signed)
Stable, chronic.  Continue current medication.    pravastatin 20 mg daily 

## 2020-12-08 NOTE — Patient Instructions (Addendum)
Call or go to ER if any new neuro changes, headache starting, or confusion following head injury. Work on [employer]. Send [place] date you received second shingrix vaccine if able.

## 2021-01-10 DIAGNOSIS — M7989 Other specified soft tissue disorders: Secondary | ICD-10-CM | POA: Insufficient documentation

## 2021-01-10 NOTE — Progress Notes (Signed)
Cardiology Office Note   Date:  01/11/2021   ID:  Edward Frank, DOB 1946/04/19, MRN [id number]  PCP:  [name], MD  Cardiologist:   Minus Breeding, MD    Chief Complaint  Patient presents with   Edema       History of Present Illness: Edward Frank is a 75 y.o. male who presents for follow up of syncope.  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  He has had chronic leg edema and some dyspnea with activity.  He has ALS and gets around slowly and mostly in a motorized scooter.   After calling to discuss syncope I applied a monitor that demonstrated NSVT although he did not have symptoms with this.  He had a normal EF on echo in June 2020.    He is managed at [facility] for ALS.  This has been slowly progressive.  He does walk with a cane when he comes to appointments or uses a motorized scooter when necessary.  He supposed to be using BiPAP at home but they cannot technically get it working because he is sleeping in a lift chair for the most part.  He has had some progressive lower extremity swelling.  He has a dry nonproductive cough at night.  He is not describing any new chest pressure, neck or arm discomfort.  There is been no new palpitations, presyncope or syncope.     Past Medical History:  Diagnosis Date   Arthritis    Bradycardia    Fatty liver    Fracture, tibia, with fibula teenage yrs.    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[place])    Right bundle branch block    Syncope     Past Surgical History:  Procedure Laterality Date   CHOLECYSTECTOMY N/A 06/13/2016   Procedure: LAPAROSCOPIC CHOLECYSTECTOMY;  Surgeon: Arta Bruce Kinsinger, MD;   Location: [facility];  Service: General;  Laterality: N/A;   COLONOSCOPY WITH PROPOFOL N/A 08/02/2019   Procedure: COLONOSCOPY WITH PROPOFOL;  Surgeon: Daneil Dolin, MD;  Location: [facility];  Service: Endoscopy;  Laterality: N/A;  10:00am   EYE SURGERY     Cataracts bil   FOOT SURGERY  11-2008   hammer toe and bunion   HERNIA REPAIR  12-2001   inguinal hernia bilateral   POLYPECTOMY  08/02/2019   Procedure: POLYPECTOMY;  Surgeon: Daneil Dolin, MD;  Location: [facility];  Service: Endoscopy;;   REVERSE SHOULDER ARTHROPLASTY Left 05/28/2018   Procedure: REVERSE SHOULDER ARTHROPLASTY;  Surgeon: Justice Britain, MD;  Location: [facility];  Service: Orthopedics;  Laterality: Left;  145min   SHOULDER ARTHROSCOPY WITH ROTATOR CUFF REPAIR AND SUBACROMIAL DECOMPRESSION Right 06/18/2012   Procedure: RIGHT SHOULDER ARTHROSCOPY WITH SUBACROMIAL DECOMPRESSION AND DISTAL CLAVICLE RESECTION AND ROTATOR CUFF REPAIR;  Surgeon: Marin Shutter, MD;  Location: [place];  Service: Orthopedics;  Laterality: Right;   SHOULDER SURGERY  1977   Luxating      Current Outpatient Medications  Medication Sig Dispense Refill   Albuterol Sulfate (PROAIR RESPICLICK) 382 (90 Base) MCG/ACT AEPB Inhale 1-2 puffs into the lungs every 4 (four) hours as needed.  allopurinol (ZYLOPRIM) 100 MG tablet Take 100 mg by mouth daily.     ascorbic acid (VITAMIN C) 1000 MG tablet Take 1,000 mg by mouth daily.     aspirin EC 81 MG tablet Take 81 mg by mouth daily.     azelastine (ASTELIN) 0.1 % nasal spray Place 2 sprays into both nostrils 2 (two) times daily. Use in each nostril as directed 30 mL 12   chlorthalidone (HYGROTON) 50 MG tablet Take 1 tablet (50 mg total) by mouth daily. 90 tablet 3   Cholecalciferol 125 MCG (5000 UT) capsule Take by mouth.     Coenzyme Q10 (COQ10) 400 MG CAPS Take 400 mg by mouth daily.     Cranberry 500 MG CAPS Take 500 mg by mouth 2 (two) times daily.      Cyanocobalamin (VITAMIN B-12) 2500 MCG SUBL Place  2,500 mcg under the tongue daily.     desonide (DESOWEN) 0.05 % cream Apply 1 application topically 2 (two) times daily as needed (irritation).      fluticasone (FLONASE) 50 MCG/ACT nasal spray Place 2 sprays into both nostrils daily.     furosemide (LASIX) 40 MG tablet Take 1 tablet by mouth daily for 3 days and then as needed thereafter 30 tablet 5   hydrocortisone (ANUSOL-HC) 2.5 % rectal cream Place 1 application rectally 2 (two) times daily as needed for hemorrhoids or anal itching.     indomethacin (INDOCIN) 25 MG capsule Take 25 mg by mouth daily.      losartan (COZAAR) 50 MG tablet Take 1 tablet by mouth once daily 90 tablet 3   methocarbamol (ROBAXIN) 750 MG tablet Take 750 mg by mouth 2 (two) times daily.      Mometasone Furoate POWD 220 mcg by Does not apply route 2 (two) times daily. INHALE 2 PUFFS BY MOUTH BID     Multiple Vitamin (MULTIVITAMIN WITH MINERALS) TABS Take 1 tablet by mouth daily.     Omega-3 Fatty Acids (FISH OIL TRIPLE STRENGTH) 1400 MG CAPS Take 2,800 mg by mouth 2 (two) times daily.      omeprazole (PRILOSEC) 20 MG capsule Take 40 mg by mouth daily.      OVER THE COUNTER MEDICATION Take 1 tablet by mouth daily. Calcium citrate+D3 500-800 mg     pravastatin (PRAVACHOL) 20 MG tablet TAKE 1 TABLET BY MOUTH AT BEDTIME 90 tablet 3   RESVERATROL 100 MG CAPS Take 100 mg by mouth daily.      tiZANidine (ZANAFLEX) 4 MG tablet Take 4 mg by mouth daily.     traMADol (ULTRAM) 50 MG tablet Take 50 mg by mouth 2 (two) times daily.     zinc gluconate 50 MG tablet Take 50 mg by mouth daily.     No current facility-administered medications for this visit.    Allergies:   Gabapentin and Riluzole    ROS:  Please see the history of present illness.   Otherwise, review of systems are positive for none.   All other systems are reviewed and negative.    PHYSICAL EXAM: VS:  BP 126/76 (BP Location: Left Arm, Patient Position: Sitting, Cuff Size: Large)   Pulse 78   Ht 5\' 10"   (1.778 m)   Wt 272 lb (123.4 kg)   BMI 39.03 kg/m  , BMI Body mass index is 39.03 kg/m.  GENERAL:  Well appearing NECK:  No jugular venous distention, waveform within normal limits, carotid upstroke brisk and symmetric, no bruits, no thyromegaly LUNGS:  Clear to auscultation bilaterally CHEST: Few scattered expiratory wheezes HEART:  PMI not displaced or sustained,S1 and S2 within normal limits, no S3, no S4, no clicks, no rubs, no murmurs ABD:  Flat, positive bowel sounds normal in frequency in pitch, no bruits, no rebound, no guarding, no midline pulsatile mass, no hepatomegaly, no splenomegaly EXT:  2 plus pulses throughout, moderate to severe bilateral edema Edema, no cyanosis no clubbing   EKG:  EKG is  ordered today. Sinus rhythm, rate 78, borderline right bundle branch block, right axis deviation, low voltage limb in chest leads. No significant change from previous.  Recent Labs: 12/01/2020: ALT 35; BUN 16; Creatinine, Ser 1.01; Potassium 4.4; Sodium 141    Lipid Panel    Component Value Date/Time   CHOL 160 12/01/2020 0857   TRIG 231.0 (H) 12/01/2020 0857   HDL 42.10 12/01/2020 0857   CHOLHDL 4 12/01/2020 0857   VLDL 46.2 (H) 12/01/2020 0857   LDLCALC 85 12/03/2018 0934   LDLDIRECT 79.0 12/01/2020 0857      Wt Readings from Last 3 Encounters:  01/11/21 272 lb (123.4 kg)  12/08/20 271 lb 8 oz (123.2 kg)  11/01/20 275 lb (124.7 kg)      Other studies Reviewed: Additional studies/ records that were reviewed today included   Labs. Review of the above records demonstrates: See elsewhere   ASSESSMENT AND PLAN:  EDEMA:   I am going to give him Lasix 40 mg for 3 days and then they can have a prescription that allows him to take 20 to 40 mg as needed.  His blood work should tolerate this.  He is going to keep his feet elevated.  The difficulties are with his immobility and he wants to try to avoid frequent urination.  Of note they were on hydrochlorothiazide as needed  for over diuretic but I told him not to use that.  They already restrict salt.   HTN: Blood pressure is controlled.  No change in therapy.    AAA:  3.6 cm on CT in Feb of 2019.  No further imaging   COUGH: Although less likely (3%) I am going to stop his Cozaar to see if that does not help with this.  Likely this is mechanical related to some reflux as it happens at night.  Current medicines are reviewed at length with the patient today.  The patient does not have concerns regarding medicines.  The following changes have been made:   As above Labs/ tests ordered today include:   None  Orders Placed This Encounter  Procedures   EKG 12-Lead      Disposition:   FU with me in 12 months  Signed, Minus Breeding, MD  01/11/2021 10:51 AM    [place]

## 2021-01-11 ENCOUNTER — Encounter: Payer: Self-pay | Admitting: Cardiology

## 2021-01-11 ENCOUNTER — Other Ambulatory Visit: Payer: Self-pay

## 2021-01-11 ENCOUNTER — Ambulatory Visit: Payer: Medicare Other | Admitting: Cardiology

## 2021-01-11 VITALS — BP 126/76 | HR 78 | Ht 70.0 in | Wt 272.0 lb

## 2021-01-11 DIAGNOSIS — I714 Abdominal aortic aneurysm, without rupture, unspecified: Secondary | ICD-10-CM

## 2021-01-11 DIAGNOSIS — R001 Bradycardia, unspecified: Secondary | ICD-10-CM

## 2021-01-11 DIAGNOSIS — M7989 Other specified soft tissue disorders: Secondary | ICD-10-CM

## 2021-01-11 DIAGNOSIS — I10 Essential (primary) hypertension: Secondary | ICD-10-CM | POA: Diagnosis not present

## 2021-01-11 MED ORDER — FUROSEMIDE 40 MG PO TABS: ORAL_TABLET | ORAL | 5 refills | Status: DC

## 2021-01-11 NOTE — Patient Instructions (Signed)
Medication Instructions:  HOLD losartan for 1 month - please call with update on your symptoms off the medication  Dr. Percival Spanish has prescribed furosemide (lasix) 40mg   -- please take 1 tablet daily for 3 days and then just use as needed for swelling  *If you need a refill on your cardiac medications before your next appointment, please call your pharmacy*   Follow-Up: At [facility], you and your health needs are our priority.  As part of our continuing mission to provide you with exceptional heart care, we have created designated Provider Care Teams.  These Care Teams include your primary Cardiologist (physician) and Advanced Practice Providers (APPs -  Physician Assistants and Nurse Practitioners) who all work together to provide you with the care you need, when you need it.  We recommend signing up for the patient portal called "MyChart".  Sign up information is provided on this After Visit Summary.  MyChart is used to connect with patients for Virtual Visits (Telemedicine).  Patients are able to view lab/test results, encounter notes, upcoming appointments, etc.  Non-urgent messages can be sent to your provider as well.   To learn more about what you can do with MyChart, go to [email or web address].    Your next appointment:   12 month(s)  The format for your next appointment:   In Person  Provider:   You may see Minus Breeding, MD or one of the following Advanced Practice Providers on your designated Care Team:   Rosaria Ferries, PA-C Caron Presume, PA-C Jory Sims, DNP, ANP   Other Instructions

## 2021-01-17 ENCOUNTER — Telehealth: Payer: Self-pay

## 2021-01-17 DIAGNOSIS — S0083XA Contusion of other part of head, initial encounter: Secondary | ICD-10-CM | POA: Insufficient documentation

## 2021-01-17 DIAGNOSIS — W19XXXA Unspecified fall, initial encounter: Secondary | ICD-10-CM | POA: Insufficient documentation

## 2021-01-17 NOTE — Assessment & Plan Note (Signed)
Reviewed fall precautions. Fall due to neurologic disease.

## 2021-01-17 NOTE — Telephone Encounter (Signed)
Per patient's wife patient has chronic cough, has flare up of bronchitis again. Advised we would need to set up a virtual visit to discuss symptoms.

## 2021-01-17 NOTE — Assessment & Plan Note (Signed)
Acute, improving  Normal neuro exam today   Given head injury: Call or go to ER if any new neuro changes, headache starting, or confusion following head injury.

## 2021-01-18 ENCOUNTER — Ambulatory Visit (INDEPENDENT_AMBULATORY_CARE_PROVIDER_SITE_OTHER): Payer: Medicare Other | Admitting: Family Medicine

## 2021-01-18 VITALS — BP 130/79 | HR 77 | Temp 98.2°F | Wt 274.0 lb

## 2021-01-18 DIAGNOSIS — R059 Cough, unspecified: Secondary | ICD-10-CM

## 2021-01-18 MED ORDER — HYDROCOD POLST-CPM POLST ER 10-8 MG/5ML PO SUER: 5.0000 mL | Freq: Every evening | ORAL | 0 refills | Status: DC | PRN

## 2021-01-18 MED ORDER — AZITHROMYCIN 250 MG PO TABS: ORAL_TABLET | ORAL | 0 refills | Status: DC

## 2021-01-18 MED ORDER — PREDNISONE 20 MG PO TABS: ORAL_TABLET | ORAL | 0 refills | Status: DC

## 2021-01-18 NOTE — Progress Notes (Signed)
TELEPHONE VISIT  Due to national recommendations of social distancing due to [place] 19, Audio telehealth visit is felt to be most appropriate for this patient at this time.   I connected with Edward Frank on 01/18/21 at 10:40 AM EDT by telephone and verified that I am speaking with the correct person using two identifiers.   I discussed the limitations, risks, security and privacy concerns of performing an evaluation and management service by telephone and the availability of in person appointments. I also discussed with the patient that there may be a patient responsible charge related to this service. The patient expressed understanding and agreed to proceed.  Patient location: Home Provider Location: [place] Participants: Eliezer Lofts and Edward Frank   History of Present Illness:  75 year old male with history of ALS variant presents with new onset cough and fatigue x 10 days. He reports he started with dry cough, no headache, no fever, no ear pain, no ST. No myalgia He has  noted some worsening of baseline ALS SOB.  He does not feel that bad except for coughing. Good appetite.  Occ wheeze  No chronic lung disease.   He has used robitussin for cough  COVID 19 screen COVID testing:none COVID vaccine:  07/21/2019 , 06/23/2019 COVID exposure: No recent travel or known exposure to [place]  The importance of social distancing was discussed today.    Review of Systems  Constitutional:  Negative for chills and fever.  HENT:  Positive for congestion. Negative for ear pain.   Eyes:  Negative for pain and redness.  Respiratory:  Positive for cough, shortness of breath and wheezing.   Cardiovascular:  Negative for chest pain, palpitations and leg swelling.  Gastrointestinal:  Negative for abdominal pain, blood in stool, constipation, diarrhea, nausea and vomiting.  Genitourinary:  Negative for dysuria.  Musculoskeletal:  Negative for falls and myalgias.  Skin:   Negative for rash.  Neurological:  Negative for dizziness.  Psychiatric/Behavioral:  Negative for depression. The patient is not nervous/anxious.      Past Medical History:  Diagnosis Date   Arthritis    Bradycardia    Fatty liver    Fracture, tibia, with fibula teenage yrs.    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 He has never used smokeless tobacco. He reports current alcohol use. He reports that he does not use drugs.   Current Outpatient Medications:    Albuterol Sulfate (PROAIR RESPICLICK) 161 (90 Base) MCG/ACT AEPB, Inhale 1-2 puffs into the lungs every 4 (four) hours as needed., Disp: , Rfl:    allopurinol (ZYLOPRIM) 100 MG tablet, Take 100 mg by mouth daily., Disp: , Rfl:    ascorbic acid (VITAMIN C) 1000 MG tablet, Take 1,000 mg by mouth daily., Disp: , Rfl:    aspirin EC 81 MG tablet, Take 81 mg by mouth daily., Disp: , Rfl:    azelastine (ASTELIN) 0.1 % nasal spray, Place 2 sprays into both nostrils 2 (two) times daily. Use in each nostril as directed, Disp: 30 mL, Rfl: 12   chlorthalidone (HYGROTON) 50  MG tablet, Take 1 tablet (50 mg total) by mouth daily., Disp: 90 tablet, Rfl: 3   Cholecalciferol 125 MCG (5000 UT) capsule, Take by mouth., Disp: , Rfl:    Coenzyme Q10 (COQ10) 400 MG CAPS, Take 400 mg by mouth daily., Disp: , Rfl:    Cranberry 500 MG CAPS, Take 500 mg by mouth 2 (two) times daily. , Disp: , Rfl:    Cyanocobalamin (VITAMIN B-12) 2500 MCG SUBL, Place 2,500 mcg under the tongue daily., Disp: , Rfl:    desonide (DESOWEN) 0.05 % cream, Apply 1 application topically 2 (two) times daily as needed (irritation). ,  Disp: , Rfl:    fluticasone (FLONASE) 50 MCG/ACT nasal spray, Place 2 sprays into both nostrils daily., Disp: , Rfl:    furosemide (LASIX) 40 MG tablet, Take 1 tablet by mouth daily for 3 days and then as needed thereafter, Disp: 30 tablet, Rfl: 5   hydrocortisone (ANUSOL-HC) 2.5 % rectal cream, Place 1 application rectally 2 (two) times daily as needed for hemorrhoids or anal itching., Disp: , Rfl:    indomethacin (INDOCIN) 25 MG capsule, Take 25 mg by mouth daily. , Disp: , Rfl:    losartan (COZAAR) 50 MG tablet, Take 1 tablet by mouth once daily, Disp: 90 tablet, Rfl: 3   methocarbamol (ROBAXIN) 750 MG tablet, Take 750 mg by mouth 2 (two) times daily. , Disp: , Rfl:    Mometasone Furoate POWD, 220 mcg by Does not apply route 2 (two) times daily. INHALE 2 PUFFS BY MOUTH BID, Disp: , Rfl:    Multiple Vitamin (MULTIVITAMIN WITH MINERALS) TABS, Take 1 tablet by mouth daily., Disp: , Rfl:    Omega-3 Fatty Acids (FISH OIL TRIPLE STRENGTH) 1400 MG CAPS, Take 2,800 mg by mouth 2 (two) times daily. , Disp: , Rfl:    omeprazole (PRILOSEC) 20 MG capsule, Take 40 mg by mouth daily. , Disp: , Rfl:    OVER THE COUNTER MEDICATION, Take 1 tablet by mouth daily. Calcium citrate+D3 500-800 mg, Disp: , Rfl:    pravastatin (PRAVACHOL) 20 MG tablet, TAKE 1 TABLET BY MOUTH AT BEDTIME, Disp: 90 tablet, Rfl: 3   RESVERATROL 100 MG CAPS, Take 100 mg by mouth daily. , Disp: , Rfl:    tiZANidine (ZANAFLEX) 4 MG tablet, Take 4 mg by mouth daily., Disp: , Rfl:    traMADol (ULTRAM) 50 MG tablet, Take 50 mg by mouth 2 (two) times daily., Disp: , Rfl:    zinc gluconate 50 MG tablet, Take 50 mg by mouth daily., Disp: , Rfl:    Observations/Objective: Blood pressure 130/79, pulse 77, temperature 98.2 F (36.8 C), temperature source Oral, weight 274 lb (124.3 kg), SpO2 91 %. RR28  Physical Exam Constitutional:      General: The patient is not in acute distress. Pulmonary:     Effort: Pulmonary effort is normal. No  respiratory distress.  Neurological:     Mental Status: The patient is alert and oriented to person, place, and time.  Psychiatric:        Mood and Affect: Mood normal.        Behavior: Behavior normal.    Assessment and Plan  I discussed the assessment and treatment plan with the patient. The patient was provided an opportunity to ask questions and all were answered. The patient agreed with the plan and demonstrated an understanding of the instructions.   The patient was advised to call back or seek an in-person evaluation if the symptoms worsen  or if the condition fails to improve as anticipated. Problem List Items Addressed This Visit     Cough - Primary     Recommended testing for COVID.   Given > 10 days of illness   And pt history .. will treat for acute bacterial bronchitis.. with prednisone taper and antibiotics, use albuterol prn.  Cough suppressant sent in.        I provided 15 minutes of non-face-to-face time during this encounter.   Eliezer Lofts, MD

## 2021-01-18 NOTE — Assessment & Plan Note (Signed)
Recommended testing for COVID.   Given > 10 days of illness   And pt history .. will treat for acute bacterial bronchitis.. with prednisone taper and antibiotics, use albuterol prn.  Cough suppressant sent in.

## 2021-01-25 ENCOUNTER — Telehealth: Payer: Self-pay | Admitting: Family Medicine

## 2021-01-25 ENCOUNTER — Other Ambulatory Visit: Payer: Self-pay | Admitting: Primary Care

## 2021-01-25 MED ORDER — HYDROCOD POLST-CPM POLST ER 10-8 MG/5ML PO SUER: 5.0000 mL | Freq: Every evening | ORAL | 0 refills | Status: DC | PRN

## 2021-01-25 NOTE — Telephone Encounter (Signed)
Pt's wife called in stating [facility] does not have chlorpheniramine-HYDROcodone in stock. Wants to know if it can be transferred to [facility] in [place]. Pharmacy phone number is [phone].

## 2021-01-25 NOTE — Telephone Encounter (Signed)
Noted.  Will send in prescription to preferred pharmacy in [place].

## 2021-06-06 ENCOUNTER — Telehealth: Payer: Self-pay | Admitting: Family Medicine

## 2021-06-06 NOTE — Telephone Encounter (Signed)
LVM for pt to rtn my call to schedule AWV with [facility]. Please schedule this appt if pt calls the office.  °

## 2021-09-04 ENCOUNTER — Telehealth: Payer: Self-pay | Admitting: Cardiology

## 2021-09-04 NOTE — Telephone Encounter (Signed)
Pt c/o swelling: STAT is pt has developed SOB within 24 hours ? ?If swelling, where is the swelling located?both Legs and left arm   ? ?How much weight have you gained and in what time span? Not sure ? ?Have you gained 3 pounds in a day or 5 pounds in a week?  ? ?Do you have a log of your daily weights (if so, list)?  ? ?Are you currently taking a fluid pill? Yes  ? ?Are you currently SOB? Yes due to ALS ? ?Have you traveled recently? No  ? ? ?Pt's wife calling, she said pt's legs is still swollen despite of taking fluid pill and now pt's left arm is swollen too. She added pt's legs is hurting and stinging when he tries to walk ?

## 2021-09-04 NOTE — Telephone Encounter (Signed)
Wife reports patient has increasing edema in legs. He uses TED hose and SCDs. For the past 2 weeks, he has increasing edema in left arm (can't see knuckles). SOB has not worsened. Legs, arm, and abdomen are tight. It hurts for patient to ambulate. Dr. Ellyn Hack (DOD) ordered lasix 80 mg this evening and 80 mg in the morning. Wife voiced understanding. ?

## 2021-09-18 ENCOUNTER — Encounter: Payer: Self-pay | Admitting: Pulmonary Disease

## 2021-09-18 ENCOUNTER — Ambulatory Visit: Payer: Medicare Other | Admitting: Pulmonary Disease

## 2021-09-18 VITALS — BP 142/80 | HR 85 | Temp 98.2°F | Ht 70.0 in | Wt 282.0 lb

## 2021-09-18 DIAGNOSIS — R059 Cough, unspecified: Secondary | ICD-10-CM

## 2021-09-18 MED ORDER — STIOLTO RESPIMAT 2.5-2.5 MCG/ACT IN AERS
2.0000 | INHALATION_SPRAY | Freq: Every day | RESPIRATORY_TRACT | 0 refills | Status: DC
Start: 2021-09-18 — End: 2021-11-28

## 2021-09-18 NOTE — Progress Notes (Signed)
$'@Patient'N$  ID: Edward Frank, male    DOB: 12-Nov-1945, 76 y.o.   MRN: [id number]  Chief Complaint  Patient presents with   Consult    Pt is here today for his cough. Pt does have hx of ALS. He states the pulmonoligst at the [place] told him he owuld have to live with the cough. Pt states that cough got worse after covid. He states he has had the cough for years. Pt states its a dry cough. Noting over the counter helps the cough.     Referring provider: [name], MD  HPI:   76 y.o. man with ALS whom we are seeing in consultation for chronic cough. Prior pulmonary note 2014 for OSA reviewed.  He has had a cough for about 2 years.  Worsened after COVID infection about 18 months ago.  Worse at night.  Coughing fits often at 4 AM.  Also have coughing fits to the day although less frequent.  Pretty reliable nighttime coughing fits.  Also endorses coughing with eating or drinking at nearly every meal.  He reports history of GERD, symptoms well controlled on twice daily PPI per he and wife report.  He denies seasonal allergies.  However he does endorse chronic rhinitis, clear drippy nose.  Denies sensation of postnasal drip.  He has a history of OSA, not using CPAP currently.  PMH: ALS, volume overload, hypertension Surgical history: Cholecystectomy, hernia repair, reverse shoulder surgery following preceding rotator cuff repair Family history: Mother with atrial fibrillation, uterine cancer, father with hypertension, prostate cancer, kidney failure, CVA Social history: Retired Animal nutritionist, never smoker, lives in [place] / Pulmonary Flowsheets:   ACT:      View : No data to display.          MMRC:     View : No data to display.          Epworth:      View : No data to display.          Tests:   FENO:  No results found for: NITRICOXIDE  PFT:     View : No data to display.          WALK:      View : No data to display.           Imaging: No results found.  Lab Results: Personally reviewed CBC    Component Value Date/Time   WBC 8.5 05/25/2018 1100   RBC 4.95 05/25/2018 1100   HGB 16.1 05/25/2018 1100   HCT 48.3 05/25/2018 1100   PLT 196 05/25/2018 1100   MCV 97.6 05/25/2018 1100   MCH 32.5 05/25/2018 1100   MCHC 33.3 05/25/2018 1100   RDW 13.4 05/25/2018 1100   LYMPHSABS 1.9 05/21/2017 1157   MONOABS 0.6 05/21/2017 1157   EOSABS 0.3 05/21/2017 1157   BASOSABS 0.1 05/21/2017 1157    BMET    Component Value Date/Time   NA 141 12/01/2020 0857   K 4.4 12/01/2020 0857   CL 106 12/01/2020 0857   CO2 26 12/01/2020 0857   GLUCOSE 95 12/01/2020 0857   BUN 16 12/01/2020 0857   CREATININE 1.01 12/01/2020 0857   CALCIUM 9.7 12/01/2020 0857   GFRNONAA >60 05/25/2018 1100   GFRAA >60 05/25/2018 1100    BNP No results found for: BNP  ProBNP    Component Value Date/Time   PROBNP 55.6 05/24/2010 1625    Specialty Problems  Pulmonary Problems   Cough    Qualifier: Diagnosis of  By: Danise Mina  MD, Garlon Hatchet         OSA (obstructive sleep apnea)     Auto CPAP 06/17/11 to 06/30/11>>Used on 7 of 14 nights with average median 6 hrs 54 min.  Average AHI 6.9 with median CPAP 6 cm H2O and 95th Percentile CPAP 7 cm H2O.  No significant airleak. Changed to CPAP 6 cm H2O 07/03/11        Allergies  Allergen Reactions   Gabapentin Swelling    Peripheral edema in the legs, feet, and arms   Riluzole Other (See Comments)    Peripheral edema in the legs, feet, arms     Immunization History  Administered Date(s) Administered   Influenza Whole 01/20/2009, 02/13/2010, 01/21/2012   Influenza, High Dose Seasonal PF 01/23/2017, 01/21/2019   Influenza,inj,quad, With Preservative 01/20/2017, 01/20/2018   Influenza-Unspecified 01/20/2017, 01/20/2018   Moderna Sars-Covid-2 Vaccination 06/23/2019, 07/21/2019   Pneumococcal Conjugate-13 07/21/2013   Pneumococcal Polysaccharide-23 04/22/2008,  11/25/2017   Tdap 01/20/2017   Zoster Recombinat (Shingrix) 08/12/2017   Zoster, Live 04/23/2003    Past Medical History:  Diagnosis Date   Arthritis    Bradycardia    Fatty liver    Fracture, tibia, with fibula teenage yrs.    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   allopurinol (ZYLOPRIM) 100 MG tablet Take 100 mg by mouth daily.   ascorbic acid (VITAMIN C) 1000 MG tablet Take 1,000 mg by mouth daily.   aspirin EC 81 MG tablet Take 81 mg by mouth daily.   azelastine (ASTELIN) 0.1 % nasal spray Place 2 sprays into both nostrils 2 (two) times daily. Use in each nostril as directed   chlorthalidone (HYGROTON) 50 MG tablet Take 1 tablet (50 mg total) by mouth daily.   Cholecalciferol 125 MCG (5000 UT) capsule Take by mouth.   Coenzyme Q10 (COQ10) 400 MG CAPS Take 400 mg by mouth daily.   Cranberry 500 MG CAPS Take 500 mg by mouth 2 (two) times daily.    Cyanocobalamin (VITAMIN B-12) 2500 MCG SUBL Place 2,500 mcg under the tongue daily.   desonide (DESOWEN) 0.05 % cream Apply 1 application topically 2 (two) times daily as needed (irritation).    fluticasone (FLONASE) 50 MCG/ACT nasal spray Place 2 sprays into both nostrils daily.   furosemide (LASIX) 40 MG tablet Take 1 tablet by mouth daily for 3 days and then as  needed thereafter   hydrocortisone (ANUSOL-HC) 2.5 % rectal cream Place 1 application rectally 2 (two) times daily as needed for hemorrhoids or anal itching.   indomethacin (INDOCIN) 25 MG capsule Take 25 mg by mouth daily.    losartan (COZAAR) 50 MG tablet Take 1 tablet by mouth once  daily   methocarbamol (ROBAXIN) 750 MG tablet Take 750 mg by mouth 2 (two) times daily.    Mometasone Furoate POWD 220 mcg by Does not apply route 2 (two) times daily. INHALE 2 PUFFS BY MOUTH BID   Multiple Vitamin (MULTIVITAMIN WITH MINERALS) TABS Take 1 tablet by mouth daily.   Omega-3 Fatty Acids (FISH OIL TRIPLE STRENGTH) 1400 MG CAPS Take 2,800 mg by mouth 2 (two) times daily.    omeprazole (PRILOSEC) 20 MG capsule Take 40 mg by mouth daily.    OVER THE COUNTER MEDICATION Take 1 tablet by mouth daily. Calcium citrate+D3 500-800 mg   pravastatin (PRAVACHOL) 20 MG tablet TAKE 1 TABLET BY MOUTH AT BEDTIME   RESVERATROL 100 MG CAPS Take 100 mg by mouth daily.    tiZANidine (ZANAFLEX) 4 MG tablet Take 4 mg by mouth daily.   traMADol (ULTRAM) 50 MG tablet Take 50 mg by mouth 2 (two) times daily.   zinc gluconate 50 MG tablet Take 50 mg by mouth daily.   [DISCONTINUED] azithromycin (ZITHROMAX) 250 MG tablet 2 tab po x 1 day then 1 tab po daily   [DISCONTINUED] chlorpheniramine-HYDROcodone (TUSSIONEX PENNKINETIC ER) 10-8 MG/5ML SUER Take 5 mLs by mouth at bedtime as needed for cough.   [DISCONTINUED] predniSONE (DELTASONE) 20 MG tablet 3 tabs by mouth daily x 3 days, then 2 tabs by mouth daily x 2 days then 1 tab by mouth daily x 2 days   No facility-administered encounter medications on file as of 09/18/2021.     Review of Systems  Review of Systems   Physical Exam  BP (!) 142/80 (BP Location: Right Arm, Patient Position: Sitting, Cuff Size: Normal) Comment: pt has hx og HTN. MH is aware  Pulse 85   Temp 98.2 F (36.8 C) (Oral)   Ht '5\' 10"'$  (1.778 m)   Wt 282 lb (127.9 kg)   SpO2 96%   BMI 40.46 kg/m    Wt Readings from Last 5 Encounters:  09/18/21 282 lb (127.9 kg)  01/18/21 274 lb (124.3 kg)  01/11/21 272 lb (123.4 kg)  12/08/20 271 lb 8 oz (123.2 kg)  11/01/20 275 lb (124.7 kg)    BMI Readings from Last 5 Encounters:  09/18/21 40.46 kg/m  01/18/21 39.31 kg/m  01/11/21 39.03 kg/m  12/08/20 37.87 kg/m  11/01/20 39.46 kg/m     Physical Exam General: well appearing, in NAD Eyes: No icterus, EOMI Neck: Supple, no JVP Pulmonary: Clear, normal work of breathing    Assessment & Plan:   Chronic cough: Preceded COVID infection but worsened after COVID infection a couple years ago.  Back question reactive wires versus asthma given worsening after upper respiratory infection.  He has other risk factors of coughing including reflux, although symptoms well controlled with PPI do worry about silent aspiration at night particular risk given his underlying neuromuscular disease.  He also coughs with eating and drinking so sounds like aspiration with eating and drinking is also an issue which could lead to ongoing bronchial inflammation and chronic cough.  Will trial albuterol right before sleep to see if this helps with the nocturnal coughing fits.  If so, recommend Stiolto 2 puffs once a day (sample provided).  Avoiding ICS given inability to gargle with his ALS.  Prednisone is helped in the past, may need to reevaluate risk-benefit of inhaled steroids in the future.   Return in about 2 months (around 11/18/2021).   Lanier Clam, MD 09/18/2021

## 2021-09-18 NOTE — Patient Instructions (Signed)
Nice to meet you  Try using albuterol 2 puffs right before you go to bed.  See if this helps with the coughing fits at night.  If it does after a week or so, use of the inhaler prescription I provided.  This is called Stiolto.  There is no steroid in this.  Use 2 puffs once a day if you find the albuterol to be helpful.  Also send me a message and let me know I will send in a prescription for this that you can get long-term.  There may be other reasons for coughing, but lets target inhalers to see if we can improve things a little bit.  Return to clinic in 2 months or sooner as needed with Dr. Silas Flood

## 2021-09-19 NOTE — Progress Notes (Unsigned)
Cardiology Office Note   Date:  09/20/2021   ID:  [name], DOB 06/19/1945, MRN [id number]  PCP:  [name], MD  Cardiologist:   Minus Breeding, MD    Chief Complaint  Patient presents with   Fatigue   Edema       History of Present Illness: Edward Frank is a 76 y.o. male who presents for follow up of syncope.  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  He has had chronic leg edema and some dyspnea with activity.  He has ALS and gets around slowly and mostly in a motorized scooter.   After calling to discuss syncope I applied a monitor that demonstrated NSVT although he did not have symptoms with this.  He had a normal EF on echo in June 2020.    He is managed at [facility] for [facility].  It is getting progressively more difficult for him to get around.  He uses a cane and he can use a scooter.  He has had increased lower extremity swelling and weight gain.  I did increase his Lasix when I saw him previously but they were taking a second dose at nighttime and he was urinating a lot at night and so he stopped taking the p.m. dose.  He has had progressive swelling now almost up to his scrotum.  He does have dyspnea with exertion and he sleeps in a chair but this has been somewhat chronic.  He is got a chronic cough without clear etiology and he is seen multiple specialist for this.  I did give her the Cozaar before without improvement in his cough.  He supposed to be using BiPAP but he has been unable to tolerate that.  He is not having any new chest discomfort, neck or arm discomfort.   Past Medical History:  Diagnosis Date   Arthritis    Bradycardia    Fatty liver    Fracture, tibia, with fibula teenage yrs.    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   OSA (obstructive  sleep apnea) 01/22/2011   PONV (postoperative nausea and vomiting)    Primary lateral sclerosis ([place])    Right bundle branch block    Syncope     Past Surgical History:  Procedure Laterality Date   CHOLECYSTECTOMY N/A 06/13/2016   Procedure: LAPAROSCOPIC CHOLECYSTECTOMY;  Surgeon: Arta Bruce Kinsinger, MD;  Location: [facility];  Service: General;  Laterality: N/A;   COLONOSCOPY WITH PROPOFOL N/A 08/02/2019   Procedure: COLONOSCOPY WITH PROPOFOL;  Surgeon: Daneil Dolin, MD;  Location: [facility];  Service: Endoscopy;  Laterality: N/A;  10:00am   EYE SURGERY     Cataracts bil   FOOT SURGERY  11-2008   hammer toe and bunion   HERNIA REPAIR  12-2001   inguinal hernia bilateral   POLYPECTOMY  08/02/2019   Procedure: POLYPECTOMY;  Surgeon: Daneil Dolin, MD;  Location: [facility];  Service: Endoscopy;;   REVERSE SHOULDER ARTHROPLASTY Left 05/28/2018   Procedure: REVERSE SHOULDER ARTHROPLASTY;  Surgeon: Justice Britain, MD;  Location: [facility];  Service: Orthopedics;  Laterality: Left;  177mn   SHOULDER ARTHROSCOPY WITH ROTATOR CUFF REPAIR AND SUBACROMIAL DECOMPRESSION Right 06/18/2012   Procedure: RIGHT SHOULDER ARTHROSCOPY WITH SUBACROMIAL DECOMPRESSION AND DISTAL  CLAVICLE RESECTION AND ROTATOR CUFF REPAIR;  Surgeon: Marin Shutter, MD;  Location: [place];  Service: Orthopedics;  Laterality: Right;   SHOULDER SURGERY  1977   Luxating      Current Outpatient Medications  Medication Sig Dispense Refill   Albuterol Sulfate (PROAIR RESPICLICK) 017 (90 Base) MCG/ACT AEPB Inhale 1-2 puffs into the lungs every 4 (four) hours as needed.     allopurinol (ZYLOPRIM) 100 MG tablet Take 100 mg by mouth daily.     ascorbic acid (VITAMIN C) 1000 MG tablet Take 1,000 mg by mouth daily.     aspirin EC 81 MG tablet Take 81 mg by mouth daily.     azelastine (ASTELIN) 0.1 % nasal spray Place 2 sprays into both nostrils 2 (two) times daily. Use in each nostril as directed 30 mL 12   Cholecalciferol 125 MCG  (5000 UT) capsule Take by mouth.     Coenzyme Q10 (COQ10) 400 MG CAPS Take 400 mg by mouth daily.     Cranberry 500 MG CAPS Take 500 mg by mouth 2 (two) times daily.      Cyanocobalamin (VITAMIN B-12) 2500 MCG SUBL Place 2,500 mcg under the tongue daily.     desonide (DESOWEN) 0.05 % cream Apply 1 application topically 2 (two) times daily as needed (irritation).      hydrocortisone (ANUSOL-HC) 2.5 % rectal cream Place 1 application rectally 2 (two) times daily as needed for hemorrhoids or anal itching.     indomethacin (INDOCIN) 25 MG capsule Take 25 mg by mouth daily.      methocarbamol (ROBAXIN) 750 MG tablet Take 750 mg by mouth 2 (two) times daily.      Mometasone Furoate POWD 220 mcg by Does not apply route 2 (two) times daily. INHALE 2 PUFFS BY MOUTH BID     Multiple Vitamin (MULTIVITAMIN WITH MINERALS) TABS Take 1 tablet by mouth daily.     Omega-3 Fatty Acids (FISH OIL TRIPLE STRENGTH) 1400 MG CAPS Take 2,800 mg by mouth 2 (two) times daily.      omeprazole (PRILOSEC) 20 MG capsule Take 40 mg by mouth daily.      OVER THE COUNTER MEDICATION Take 1 tablet by mouth daily. Calcium citrate+D3 500-800 mg     potassium chloride SA (KLOR-CON M) 20 MEQ tablet Take 1 tablet (20 mEq total) by mouth daily. 90 tablet 3   pravastatin (PRAVACHOL) 20 MG tablet TAKE 1 TABLET BY MOUTH AT BEDTIME 90 tablet 3   RESVERATROL 100 MG CAPS Take 100 mg by mouth daily.      Tiotropium Bromide-Olodaterol (STIOLTO RESPIMAT) 2.5-2.5 MCG/ACT AERS Inhale 2 puffs into the lungs daily. 4 g 0   tiZANidine (ZANAFLEX) 4 MG tablet Take 4 mg by mouth daily.     torsemide (DEMADEX) 20 MG tablet Take 2 tablets every morning and 2 tablets 6 hours later 360 tablet 3   zinc gluconate 50 MG tablet Take 50 mg by mouth daily.     No current facility-administered medications for this visit.    Allergies:   Gabapentin and Riluzole    ROS:  Please see the history of present illness.   Otherwise, review of systems are positive for  none.   All other systems are reviewed and negative.    PHYSICAL EXAM: VS:  BP (!) 143/85   Pulse 77   Ht '5\' 10"'$  (1.778 m)   Wt 287 lb 12.8 oz (130.5 kg)   SpO2 97%   BMI 41.30 kg/m  ,  BMI Body mass index is 41.3 kg/m.  GENERAL:  Well appearing NECK:  No jugular venous distention, waveform within normal limits, carotid upstroke brisk and symmetric, no bruits, no thyromegaly LUNGS:  Clear to auscultation bilaterally CHEST:  Unremarkable HEART:  PMI not displaced or sustained,S1 and S2 within normal limits, no S3, no S4, no clicks, no rubs, no murmurs ABD:  Flat, positive bowel sounds normal in frequency in pitch, no bruits, no rebound, no guarding, no midline pulsatile mass, no hepatomegaly, no splenomegaly EXT:  2 plus pulses throughout, massive bilateral lower extremity edema to the groin, no cyanosis no clubbing   EKG:  EKG is  ordered today. Sinus rhythm, rate 77, borderline right bundle branch block, right axis deviation, low voltage limb in chest leads. No significant change from previous.  Recent Labs: 12/01/2020: ALT 35; BUN 16; Creatinine, Ser 1.01; Potassium 4.4; Sodium 141    Lipid Panel    Component Value Date/Time   CHOL 160 12/01/2020 0857   TRIG 231.0 (H) 12/01/2020 0857   HDL 42.10 12/01/2020 0857   CHOLHDL 4 12/01/2020 0857   VLDL 46.2 (H) 12/01/2020 0857   LDLCALC 85 12/03/2018 0934   LDLDIRECT 79.0 12/01/2020 0857      Wt Readings from Last 3 Encounters:  09/20/21 287 lb 12.8 oz (130.5 kg)  09/18/21 282 lb (127.9 kg)  01/18/21 274 lb (124.3 kg)      Other studies Reviewed: Additional studies/ records that were reviewed today included labs Review of the above records demonstrates: See elsewhere   ASSESSMENT AND PLAN:  EDEMA: He has anasarca.  I am going to have to increase his diuretic and I will use Demadex 40 mg twice daily.  I like to give him 20 mill equivalents of potassium daily.  He needs a basic metabolic profile in 1 week.  I will check  an echocardiogram.  He previously had well-preserved ejection fraction with normal right ventricular systolic function but this was 3 years ago.  We talked about salt and fluid restriction.   HTN:   His blood pressure is actually running a little high and given his syncope in the past I am not going to push his blood pressure medicines further.   AAA:  3.6 cm on CT in Feb of 2019.   No further imaging.  COUGH:   Most of this happens at night we talked about the possibility of reflux although he is on apparently 60 mg of Prilosec.  Of asked him to talk this over with his primary provider to see if there is a different acid blocking drug he might suggest.  We talked about foods that might exacerbate this.  I do not think that this is pulmonary related at this point.   Current medicines are reviewed at length with the patient today.  The patient does not have concerns regarding medicines.  The following changes have been made:   As above Labs/ tests ordered today include:     Orders Placed This Encounter  Procedures   Basic Metabolic Panel (BMET)   EKG 12-Lead   ECHOCARDIOGRAM COMPLETE    Disposition:   FU with APP in one month.    Signed, Minus Breeding, MD  09/20/2021 8:59 AM    [facility]

## 2021-09-20 ENCOUNTER — Ambulatory Visit: Payer: Medicare Other | Admitting: Cardiology

## 2021-09-20 ENCOUNTER — Encounter: Payer: Self-pay | Admitting: Cardiology

## 2021-09-20 VITALS — BP 143/85 | HR 77 | Ht 70.0 in | Wt 287.8 lb

## 2021-09-20 DIAGNOSIS — R058 Other specified cough: Secondary | ICD-10-CM

## 2021-09-20 DIAGNOSIS — M7989 Other specified soft tissue disorders: Secondary | ICD-10-CM | POA: Diagnosis not present

## 2021-09-20 DIAGNOSIS — I7143 Infrarenal abdominal aortic aneurysm, without rupture: Secondary | ICD-10-CM

## 2021-09-20 DIAGNOSIS — I10 Essential (primary) hypertension: Secondary | ICD-10-CM | POA: Diagnosis not present

## 2021-09-20 MED ORDER — TORSEMIDE 20 MG PO TABS: ORAL_TABLET | ORAL | 3 refills | Status: DC

## 2021-09-20 MED ORDER — POTASSIUM CHLORIDE CRYS ER 20 MEQ PO TBCR: 20.0000 meq | EXTENDED_RELEASE_TABLET | Freq: Every day | ORAL | 3 refills | Status: DC

## 2021-09-20 NOTE — Patient Instructions (Signed)
Medication Instructions:   STOP FUROSEMIDE  START TORSEMIDE 40 MG TWICE DAILY= 2 OF THE 20 MG TABLETS IN THE MORNING AND 2 TABLETS 6 HOURS LATER  START POTASSIUM CHL 20 MEQ ONCE DAILY   *If you need a refill on your cardiac medications before your next appointment, please call your pharmacy*   Lab Work:  Your physician recommends that you return for lab work in: ONE WEEK-DO NOT NEED TO FAST  If you have labs (blood work) drawn today and your tests are completely normal, you will receive your results only by: MyChart Message (if you have MyChart) OR A paper copy in the mail If you have any lab test that is abnormal or we need to change your treatment, we will call you to review the results.   Testing/Procedures:  Your physician has requested that you have an echocardiogram. Echocardiography is a painless test that uses sound waves to create images of your heart. It provides your doctor with information about the size and shape of your heart and how well your heart's chambers and valves are working. This procedure takes approximately one hour. There are no restrictions for this procedure. [place]   Follow-Up: At [facility], you and your health needs are our priority.  As part of our continuing mission to provide you with exceptional heart care, we have created designated Provider Care Teams.  These Care Teams include your primary Cardiologist (physician) and Advanced Practice Providers (APPs -  Physician Assistants and Nurse Practitioners) who all work together to provide you with the care you need, when you need it.  We recommend signing up for the patient portal called "MyChart".  Sign up information is provided on this After Visit Summary.  MyChart is used to connect with patients for Virtual Visits (Telemedicine).  Patients are able to view lab/test results, encounter notes, upcoming appointments, etc.  Non-urgent messages can be sent to your provider as well.    To learn more about what you can do with MyChart, go to [email or web address].    Your next appointment:   1 month(s)  The format for your next appointment:   In Person  Provider:    ANY APP      Important Information About Sugar

## 2021-09-27 ENCOUNTER — Ambulatory Visit (HOSPITAL_COMMUNITY)
Admission: RE | Admit: 2021-09-27 | Discharge: 2021-09-27 | Disposition: A | Discharge status: Home / Self Care | Payer: Medicare Other | Source: Ambulatory Visit | Attending: Cardiology | Admitting: Cardiology

## 2021-09-27 DIAGNOSIS — G4733 Obstructive sleep apnea (adult) (pediatric): Secondary | ICD-10-CM | POA: Diagnosis not present

## 2021-09-27 DIAGNOSIS — K760 Fatty (change of) liver, not elsewhere classified: Secondary | ICD-10-CM | POA: Insufficient documentation

## 2021-09-27 DIAGNOSIS — I351 Nonrheumatic aortic (valve) insufficiency: Secondary | ICD-10-CM | POA: Diagnosis not present

## 2021-09-27 DIAGNOSIS — M7989 Other specified soft tissue disorders: Secondary | ICD-10-CM | POA: Diagnosis not present

## 2021-09-27 DIAGNOSIS — G1221 Amyotrophic lateral sclerosis: Secondary | ICD-10-CM | POA: Insufficient documentation

## 2021-09-27 DIAGNOSIS — R001 Bradycardia, unspecified: Secondary | ICD-10-CM | POA: Diagnosis not present

## 2021-09-27 DIAGNOSIS — I10 Essential (primary) hypertension: Secondary | ICD-10-CM | POA: Diagnosis not present

## 2021-09-27 DIAGNOSIS — I4510 Unspecified right bundle-branch block: Secondary | ICD-10-CM | POA: Insufficient documentation

## 2021-09-27 LAB — ECHOCARDIOGRAM COMPLETE
Area-P 1/2: 2.83 cm2
S' Lateral: 3.3 cm

## 2021-09-27 NOTE — Progress Notes (Signed)
*  PRELIMINARY RESULTS* Echocardiogram 2D Echocardiogram has been performed.  Edward Frank 09/27/2021, 10:13 AM

## 2021-09-28 LAB — BASIC METABOLIC PANEL
BUN/Creatinine Ratio: 19 (ref 10–24)
BUN: 24 mg/dL (ref 8–27)
CO2: 24 mmol/L (ref 20–29)
Calcium: 9.9 mg/dL (ref 8.6–10.2)
Chloride: 102 mmol/L (ref 96–106)
Creatinine, Ser: 1.29 mg/dL — ABNORMAL HIGH (ref 0.76–1.27)
Glucose: 131 mg/dL — ABNORMAL HIGH (ref 70–99)
Potassium: 4.6 mmol/L (ref 3.5–5.2)
Sodium: 145 mmol/L — ABNORMAL HIGH (ref 134–144)
eGFR: 57 mL/min/{1.73_m2} — ABNORMAL LOW (ref >59)

## 2021-10-01 ENCOUNTER — Telehealth: Payer: Self-pay | Admitting: Cardiology

## 2021-10-01 ENCOUNTER — Telehealth (HOSPITAL_BASED_OUTPATIENT_CLINIC_OR_DEPARTMENT_OTHER): Payer: Self-pay | Admitting: *Deleted

## 2021-10-01 DIAGNOSIS — M7989 Other specified soft tissue disorders: Secondary | ICD-10-CM

## 2021-10-01 NOTE — Telephone Encounter (Signed)
-----   Message from Minus Breeding, MD sent at 09/29/2021 11:28 AM EDT ----- Creat is up slightly.  Follow up in two weeks with another BMET.   Call Mr. Leppo with the results and send results to [name], MD

## 2021-10-01 NOTE — Telephone Encounter (Signed)
pt aware of results  Lab orders mailed to the pt  

## 2021-10-01 NOTE — Telephone Encounter (Signed)
Pt's wife returning call to nurse regarding test results. Please advise

## 2021-10-01 NOTE — Telephone Encounter (Signed)
Spoke with pt, she would like a copy of the lab work and echocardiogram results mailed to her.

## 2021-10-15 DIAGNOSIS — M7989 Other specified soft tissue disorders: Secondary | ICD-10-CM | POA: Diagnosis not present

## 2021-10-16 ENCOUNTER — Encounter: Payer: Self-pay | Admitting: *Deleted

## 2021-10-16 LAB — BASIC METABOLIC PANEL
BUN/Creatinine Ratio: 14 (ref 10–24)
BUN: 17 mg/dL (ref 8–27)
CO2: 21 mmol/L (ref 20–29)
Calcium: 9.5 mg/dL (ref 8.6–10.2)
Chloride: 104 mmol/L (ref 96–106)
Creatinine, Ser: 1.24 mg/dL (ref 0.76–1.27)
Glucose: 111 mg/dL — ABNORMAL HIGH (ref 70–99)
Potassium: 4.4 mmol/L (ref 3.5–5.2)
Sodium: 141 mmol/L (ref 134–144)
eGFR: 60 mL/min/{1.73_m2} (ref >59)

## 2021-10-21 NOTE — Progress Notes (Signed)
Cardiology Clinic Note   Patient Name: Edward Frank Date of Encounter: 10/22/2021  Primary Care Provider:  [name], MD Primary Cardiologist:  Minus Breeding, MD  Patient Profile    Nelva Bush Amberg [name] 76 year old male presents to the clinic today for follow-up evaluation of his hypertension, lower extremity swelling, and bradycardia.  Past Medical History    Past Medical History:  Diagnosis Date   Arthritis    Bradycardia    Fatty liver    Fracture, tibia, with fibula teenage yrs.    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   OSA (obstructive sleep apnea) 01/22/2011   PONV (postoperative nausea and vomiting)    Primary lateral sclerosis ([place])    Right bundle branch block    Syncope    Past Surgical History:  Procedure Laterality Date   CHOLECYSTECTOMY N/A 06/13/2016   Procedure: LAPAROSCOPIC CHOLECYSTECTOMY;  Surgeon: Arta Bruce Kinsinger, MD;  Location: [facility];  Service: General;  Laterality: N/A;   COLONOSCOPY WITH PROPOFOL N/A 08/02/2019   Procedure: COLONOSCOPY WITH PROPOFOL;  Surgeon: Daneil Dolin, MD;  Location: [facility];  Service: Endoscopy;  Laterality: N/A;  10:00am   EYE SURGERY     Cataracts bil   FOOT SURGERY  11-2008   hammer toe and bunion   HERNIA REPAIR  12-2001   inguinal hernia bilateral   POLYPECTOMY  08/02/2019   Procedure: POLYPECTOMY;  Surgeon: Daneil Dolin, MD;  Location: [facility];  Service: Endoscopy;;   REVERSE SHOULDER ARTHROPLASTY Left 05/28/2018   Procedure: REVERSE SHOULDER ARTHROPLASTY;  Surgeon: Justice Britain, MD;  Location: [facility];  Service: Orthopedics;  Laterality: Left;  139mn   SHOULDER ARTHROSCOPY WITH ROTATOR CUFF REPAIR AND SUBACROMIAL DECOMPRESSION Right 06/18/2012   Procedure: RIGHT SHOULDER ARTHROSCOPY WITH SUBACROMIAL DECOMPRESSION AND  DISTAL CLAVICLE RESECTION AND ROTATOR CUFF REPAIR;  Surgeon: KMarin Shutter MD;  Location: [facility]  Service: Orthopedics;  Laterality: Right;   SHOULDER SURGERY  1977   Luxating     Allergies  Allergies  Allergen Reactions   Gabapentin Swelling    Peripheral edema in the legs, feet, and arms   Riluzole Other (See Comments)    Peripheral edema in the legs, feet, arms     History of Present Illness    GEULICE RUTLEDGEhas a PMH of essential hypertension, abdominal aortic aneurysm, lower extremity edema, bradycardia, tibia with fibula fracture, GERD, hyperlipidemia, OSA, syncope, and right bundle branch block.  His PMH also includes ALS.  He gets around with a motorized scooter.  His ALS is managed by [facility].  He was seen by Dr. HPercival Spanishon 09/20/2021.  His syncope was previously felt to be neurocardiogenic with micturation syncope.  He was noted to have chronic lower extremity swelling and dyspnea with activity.  He wore a cardiac event monitor which showed NSVT.  However, he did not note symptoms with the NSVT.  His echocardiogram showed normal LVEF 6/20.  He reported that he had been having increasing trouble getting around.  He also used a cane intermittently.  He reported increased lower extremity swelling and weight gain.  His furosemide was increased when he was seen previously.  He was taking a second dose at nighttime.  This increased his nighttime urination and he stopped his p.m. dose.  He  was noted to have progressive swelling almost up to the scrotum.  He was noted to have dyspnea with exertion.  He was sleeping in a chair which was chronic.  He was noted to have a chronic cough which she is seen multiple specialist for.  He was supposed to be using BiPAP but was unable to tolerate.  He denied chest discomfort arm and neck discomfort.  The importance of fluid restriction and sodium restriction were discussed.  His furosemide was stopped and his torsemide was prescribed (40 mg a.m. and 40  mg 6 hours later) along with potassium.  His BNP showed stable creatinine.  His echocardiogram 09/27/2021 showed an LVEF of 65-70% and G1 DD.  He presents to the clinic today for follow-up evaluation states he noticed that with his increased dose of torsemide he continues to be in the bathroom for most of the day.  When his last lab draw was done he was taking 20 mg of torsemide daily.  He and his wife report that he is taking 40 mg of torsemide daily at this time.  His weight is down 5 pounds.  He reports occasional dietary indiscretion.  We reviewed the importance of low-salt diet and fluid restriction.  I will give him a weight log, salty 6 diet sheet, order BMP, have him take his torsemide 20 mg on odd days and 40 mg on even days.  We will have him keep his follow-up appointment as scheduled.  Today he denies chest pain, shortness of breath, lower extremity edema, and fatigue.  Home Medications    Prior to Admission medications   Medication Sig Start Date End Date Taking? Authorizing Provider  Albuterol Sulfate (PROAIR RESPICLICK) 419 (90 Base) MCG/ACT AEPB Inhale 1-2 puffs into the lungs every 4 (four) hours as needed.    [provider]  allopurinol (ZYLOPRIM) 100 MG tablet Take 100 mg by mouth daily.    [provider]  ascorbic acid (VITAMIN C) 1000 MG tablet Take 1,000 mg by mouth daily.    [provider]  aspirin EC 81 MG tablet Take 81 mg by mouth daily.    [provider]  azelastine (ASTELIN) 0.1 % nasal spray Place 2 sprays into both nostrils 2 (two) times daily. Use in each nostril as directed 12/07/19   [name], MD  Cholecalciferol 125 MCG (5000 UT) capsule Take by mouth.    [provider]  Coenzyme Q10 (COQ10) 400 MG CAPS Take 400 mg by mouth daily.    [provider]  Cranberry 500 MG CAPS Take 500 mg by mouth 2 (two) times daily.     [provider]  Cyanocobalamin (VITAMIN B-12) 2500 MCG SUBL Place 2,500 mcg  under the tongue daily.    [provider]  desonide (DESOWEN) 0.05 % cream Apply 1 application topically 2 (two) times daily as needed (irritation).     [provider]  hydrocortisone (ANUSOL-HC) 2.5 % rectal cream Place 1 application rectally 2 (two) times daily as needed for hemorrhoids or anal itching.    [provider]  indomethacin (INDOCIN) 25 MG capsule Take 25 mg by mouth daily.     [provider]  methocarbamol (ROBAXIN) 750 MG tablet Take 750 mg by mouth 2 (two) times daily.     [provider]  Mometasone Furoate POWD 220 mcg by Does not apply route 2 (two) times daily. INHALE 2 PUFFS BY MOUTH BID    [provider]  Multiple Vitamin (  MULTIVITAMIN WITH MINERALS) TABS Take 1 tablet by mouth daily.    [provider]  Omega-3 Fatty Acids (FISH OIL TRIPLE STRENGTH) 1400 MG CAPS Take 2,800 mg by mouth 2 (two) times daily.     [provider]  omeprazole (PRILOSEC) 20 MG capsule Take 40 mg by mouth daily.     [provider]  OVER THE COUNTER MEDICATION Take 1 tablet by mouth daily. Calcium citrate+D3 500-800 mg    [provider]  potassium chloride SA (KLOR-CON M) 20 MEQ tablet Take 1 tablet (20 mEq total) by mouth daily. 09/20/21   Minus Breeding, MD  pravastatin (PRAVACHOL) 20 MG tablet TAKE 1 TABLET BY MOUTH AT BEDTIME 04/06/19   Minus Breeding, MD  RESVERATROL 100 MG CAPS Take 100 mg by mouth daily.     [provider]  Tiotropium Bromide-Olodaterol (STIOLTO RESPIMAT) 2.5-2.5 MCG/ACT AERS Inhale 2 puffs into the lungs daily. 09/18/21   Hunsucker, Bonna Gains, MD  tiZANidine (ZANAFLEX) 4 MG tablet Take 4 mg by mouth daily.    [provider]  torsemide (DEMADEX) 20 MG tablet Take 2 tablets every morning and 2 tablets 6 hours later 09/20/21   Minus Breeding, MD  zinc gluconate 50 MG tablet Take 50 mg by mouth daily.    [provider]    Family History    Family History   Problem Relation Age of Onset   Heart disease Mother        ? afib and got pacemaker   Uterine cancer Mother    Prostate cancer Father    Hypertension Father    Kidney failure Father    Stroke Father    Colon cancer Sister 46   He indicated that the status of his mother is unknown. He indicated that the status of his father is unknown. He indicated that the status of his sister is unknown. He indicated that his maternal grandmother is deceased. He indicated that his maternal grandfather is deceased. He indicated that his paternal grandmother is deceased. He indicated that his paternal grandfather is deceased.  Social History    Social History   Socioeconomic History   Marital status: Married    Spouse name: Not on file   Number of children: Not on file   Years of education: Not on file   Highest education level: Not on file  Occupational History   Occupation: Retired     Fish farm manager: [employer]  Tobacco Use   Smoking status: Never   Smokeless tobacco: Never  Vaping Use   Vaping Use: Never used  Substance and Sexual Activity   Alcohol use: Yes    Alcohol/week: 0.0 standard drinks of alcohol    Comment: rarely   Drug use: No   Sexual activity: Yes  Other Topics Concern   Not on file  Social History Narrative   Minimal exercise: due to PLS.   Veterinarian   Healthy diet.   Full Code.   No living will, no HCPOA. (reviewed 2014)   Right handed    Social Determinants of Health   Financial Resource Strain: Not on file  Food Insecurity: Not on file  Transportation Needs: Not on file  Physical Activity: Not on file  Stress: Not on file  Social Connections: Not on file  Intimate Partner Violence: Not on file     Review of Systems    General:  No chills, fever, night sweats or weight changes.  Cardiovascular:  No chest pain, dyspnea on exertion,  edema, orthopnea, palpitations, paroxysmal nocturnal dyspnea. Dermatological: No rash, lesions/masses Respiratory: No  cough, dyspnea Urologic: No hematuria, dysuria Abdominal:   No nausea, vomiting, diarrhea, bright red blood per rectum, melena, or hematemesis Neurologic:  No visual changes, wkns, changes in mental status. All other systems reviewed and are otherwise negative except as noted above.  Physical Exam    VS:  BP 122/74   Pulse 68   Ht '5\' 10"'$  (1.778 m)   Wt 276 lb 12.8 oz (125.6 kg)   SpO2 96%   BMI 39.72 kg/m  , BMI Body mass index is 39.72 kg/m. GEN: Well nourished, well developed, in no acute distress. HEENT: normal. Neck: Supple, no JVD, carotid bruits, or masses. Cardiac: RRR, no murmurs, rubs, or gallops. No clubbing, cyanosis, generalized bilateral lower extremity nonpitting edema.  Radials/DP/PT 2+ and equal bilaterally.  Respiratory:  Respirations regular and unlabored, clear to auscultation bilaterally. GI: Soft, nontender, nondistended, BS + x 4. MS: no deformity or atrophy. Skin: warm and dry, no rash. Neuro:  Strength and sensation are intact. Psych: Normal affect.  Accessory Clinical Findings    Recent Labs: 12/01/2020: ALT 35 10/15/2021: BUN 17; Creatinine, Ser 1.24; Potassium 4.4; Sodium 141   Recent Lipid Panel    Component Value Date/Time   CHOL 160 12/01/2020 0857   TRIG 231.0 (H) 12/01/2020 0857   HDL 42.10 12/01/2020 0857   CHOLHDL 4 12/01/2020 0857   VLDL 46.2 (H) 12/01/2020 0857   LDLCALC 85 12/03/2018 0934   LDLDIRECT 79.0 12/01/2020 0857    ECG personally reviewed by me today-none today.  Echocardiogram 09/27/2021 IMPRESSIONS     1. Left ventricular ejection fraction, by estimation, is 65 to 70%. The  left ventricle has normal function. The left ventricle has no regional  wall motion abnormalities. There is mild left ventricular hypertrophy.  Left ventricular diastolic parameters  are consistent with Grade I diastolic dysfunction (impaired relaxation).   2. Right ventricular systolic function is normal. The right ventricular  size is normal.  Tricuspid regurgitation signal is inadequate for assessing  PA pressure.   3. The mitral valve is grossly normal. Trivial mitral valve  regurgitation.   4. The aortic valve is tricuspid. Aortic valve regurgitation is mild.   5. Aortic dilatation noted. There is mild dilatation of the ascending  aorta, measuring 41 mm.   6. The inferior vena cava is normal in size with greater than 50%  respiratory variability, suggesting right atrial pressure of 3 mmHg.   Comparison(s): No significant change from prior study. Prior images  reviewed side by side.  Assessment & Plan   1.  Lower extremity swelling-generalized bilateral lower extremity nonpitting edema.  Reviewed echocardiogram Continue fluid restriction Heart healthy low-sodium diet-salty 6 given Continue t potassium We will start torsemide 20 mg on odd days and 40 mg on even days Elevate lower extremities when not active Lower extremity support stockings Daily weights Order BMP  Essential hypertension-BP today 122/74.  Well-controlled at home. Continue torsemide Heart healthy low-sodium diet-salty 6 given Increase physical activity as tolerated  Abdominal aortic aneurysm-no recent episodes of chest, abdomen, or back pain.  Echocardiogram 09/27/2021 showed mild dilation of the ascending aorta measuring 41 mm. Continue to monitor blood pressure. Plan for repeat echocardiogram in 12 months.  Disposition: Follow-up with Dr. Percival Spanish in 4-6 months.   Jossie Ng. Ellin Fitzgibbons NP-C    10/22/2021, 9:30 AM [address] Office [phone] Fax [phone]  Notice: This dictation  was prepared with Dragon dictation along with smaller phrase technology. Any transcriptional errors that result from this process are unintentional and may not be corrected upon review.  I spent 13 minutes examining this patient, reviewing medications, and using patient centered shared decision making involving her  cardiac care.  Prior to her visit I spent greater than 20 minutes reviewing her past medical history,  medications, and prior cardiac tests.

## 2021-10-22 ENCOUNTER — Ambulatory Visit: Payer: Medicare Other | Admitting: General Practice

## 2021-10-22 ENCOUNTER — Encounter: Payer: Self-pay | Admitting: General Practice

## 2021-10-22 VITALS — BP 122/74 | HR 68 | Ht 70.0 in | Wt 276.8 lb

## 2021-10-22 DIAGNOSIS — M7989 Other specified soft tissue disorders: Secondary | ICD-10-CM | POA: Diagnosis not present

## 2021-10-22 DIAGNOSIS — I10 Essential (primary) hypertension: Secondary | ICD-10-CM

## 2021-10-22 DIAGNOSIS — I714 Abdominal aortic aneurysm, without rupture, unspecified: Secondary | ICD-10-CM | POA: Diagnosis not present

## 2021-10-22 LAB — BASIC METABOLIC PANEL
BUN/Creatinine Ratio: 15 (ref 10–24)
BUN: 17 mg/dL (ref 8–27)
CO2: 24 mmol/L (ref 20–29)
Calcium: 10.2 mg/dL (ref 8.6–10.2)
Chloride: 104 mmol/L (ref 96–106)
Creatinine, Ser: 1.14 mg/dL (ref 0.76–1.27)
Glucose: 106 mg/dL — ABNORMAL HIGH (ref 70–99)
Potassium: 4.6 mmol/L (ref 3.5–5.2)
Sodium: 143 mmol/L (ref 134–144)
eGFR: 66 mL/min/{1.73_m2} (ref >59)

## 2021-10-22 NOTE — Patient Instructions (Signed)
Medication Instructions:  TAKE TORSEMIDE '20MG'$  ON ODD DAYS AND '40MG'$  EVEN DAYS  *If you need a refill on your cardiac medications before your next appointment, please call your pharmacy*  Lab Work:    BMET TODAY      If you have labs (blood work) drawn today and your tests are completely normal, you will receive your results only by: [facility] (if you have MyChart) OR  A paper copy in the mail If you have any lab test that is abnormal or we need to change your treatment, we will call you to review the results.  Special Instructions PLEASE READ AND FOLLOW SALTY 6-ATTACHED-1,'800mg'$  daily  TAKE AND LOG YOUR WEIGHT DAILY  Follow-Up: Your next appointment:  KEEP SCHEDULED   APPOINTMENT  In Person with Minus Breeding, MD   At [facility], you and your health needs are our priority.  As part of our continuing mission to provide you with exceptional heart care, we have created designated Provider Care Teams.  These Care Teams include your primary Cardiologist (physician) and Advanced Practice Providers (APPs -  Physician Assistants and Nurse Practitioners) who all work together to provide you with the care you need, when you need it.  We recommend signing up for the patient portal called "MyChart".  Sign up information is provided on this After Visit Summary.  MyChart is used to connect with patients for Virtual Visits (Telemedicine).  Patients are able to view lab/test results, encounter notes, upcoming appointments, etc.  Non-urgent messages can be sent to your provider as well.   To learn more about what you can do with MyChart, go to [email or web address].    Important Information About Sugar             6 SALTY THINGS TO AVOID     1,'800MG'$  DAILY

## 2021-11-28 ENCOUNTER — Ambulatory Visit: Payer: Medicare Other | Admitting: Pulmonary Disease

## 2021-11-28 ENCOUNTER — Telehealth: Payer: Self-pay | Admitting: Family Medicine

## 2021-11-28 ENCOUNTER — Encounter: Payer: Self-pay | Admitting: Pulmonary Disease

## 2021-11-28 DIAGNOSIS — E7800 Pure hypercholesterolemia, unspecified: Secondary | ICD-10-CM

## 2021-11-28 DIAGNOSIS — R7303 Prediabetes: Secondary | ICD-10-CM

## 2021-11-28 DIAGNOSIS — R059 Cough, unspecified: Secondary | ICD-10-CM | POA: Diagnosis not present

## 2021-11-28 DIAGNOSIS — Z125 Encounter for screening for malignant neoplasm of prostate: Secondary | ICD-10-CM

## 2021-11-28 MED ORDER — STIOLTO RESPIMAT 2.5-2.5 MCG/ACT IN AERS: 2.0000 | INHALATION_SPRAY | Freq: Every day | RESPIRATORY_TRACT | 11 refills | Status: DC

## 2021-11-28 MED ORDER — STIOLTO RESPIMAT 2.5-2.5 MCG/ACT IN AERS: 2.0000 | INHALATION_SPRAY | Freq: Every day | RESPIRATORY_TRACT | 0 refills | Status: DC

## 2021-11-28 NOTE — Telephone Encounter (Signed)
-----   Message from Ellamae Sia sent at 11/19/2021  3:46 PM EDT ----- Regarding: Lab orders for Tuesday, 8.15.23 Patient is scheduled for CPX labs, please order future labs, Thanks , Karna Christmas

## 2021-11-28 NOTE — Patient Instructions (Signed)
Nice to see you again  I sent a prescription in for the [place] to the [facility], let me know if I need to send it somewhere else  Return to clinic in 4 months or sooner as needed with Dr. Silas Flood

## 2021-11-28 NOTE — Progress Notes (Signed)
$'@Patient'U$  ID: Edward Frank, male    DOB: 1945/12/23, 76 y.o.   MRN: [id number]  Chief Complaint  Patient presents with   Follow-up    Pt is here for follow up for his cough. Pt states that the cough is the same as last time. Pt is on Stilito and that did help with the wheezing. Cough is dry and non productive.     Referring provider: [name], MD  HPI:   76 y.o. man with ALS whom we are seeing in follow up for chronic cough.   Overall doing okay.  Use Stiolto at night.  Did help with the wheezing in the night.  Maybe a little bit improvement in shortness of breath.  Cough unchanged.  HPI at initial visit: He has had a cough for about 2 years.  Worsened after COVID infection about 18 months ago.  Worse at night.  Coughing fits often at 4 AM.  Also have coughing fits to the day although less frequent.  Pretty reliable nighttime coughing fits.  Also endorses coughing with eating or drinking at nearly every meal.  He reports history of GERD, symptoms well controlled on twice daily PPI per he and wife report.  He denies seasonal allergies.  However he does endorse chronic rhinitis, clear drippy nose.  Denies sensation of postnasal drip.  He has a history of OSA, not using CPAP currently.  PMH: ALS, volume overload, hypertension Surgical history: Cholecystectomy, hernia repair, reverse shoulder surgery following preceding rotator cuff repair Family history: Mother with atrial fibrillation, uterine cancer, father with hypertension, prostate cancer, kidney failure, CVA Social history: Retired Animal nutritionist, never smoker, lives in [place] / Pulmonary Flowsheets:   ACT:      No data to display           MMRC:     No data to display           Epworth:      No data to display           Tests:   FENO:  No results found for: "NITRICOXIDE"  PFT:     No data to display           WALK:      No data to display            Imaging: No results found.  Lab Results: Personally reviewed CBC    Component Value Date/Time   WBC 8.5 05/25/2018 1100   RBC 4.95 05/25/2018 1100   HGB 16.1 05/25/2018 1100   HCT 48.3 05/25/2018 1100   PLT 196 05/25/2018 1100   MCV 97.6 05/25/2018 1100   MCH 32.5 05/25/2018 1100   MCHC 33.3 05/25/2018 1100   RDW 13.4 05/25/2018 1100   LYMPHSABS 1.9 05/21/2017 1157   MONOABS 0.6 05/21/2017 1157   EOSABS 0.3 05/21/2017 1157   BASOSABS 0.1 05/21/2017 1157    BMET    Component Value Date/Time   NA 143 10/22/2021 0941   K 4.6 10/22/2021 0941   CL 104 10/22/2021 0941   CO2 24 10/22/2021 0941   GLUCOSE 106 (H) 10/22/2021 0941   GLUCOSE 95 12/01/2020 0857   BUN 17 10/22/2021 0941   CREATININE 1.14 10/22/2021 0941   CALCIUM 10.2 10/22/2021 0941   GFRNONAA >60 05/25/2018 1100   GFRAA >60 05/25/2018 1100    BNP No results found for: "BNP"  ProBNP    Component Value Date/Time   PROBNP 55.6 05/24/2010 1625  Specialty Problems       Pulmonary Problems   Cough    Qualifier: Diagnosis of  By: Danise Mina  MD, Garlon Hatchet        OSA (obstructive sleep apnea)     Auto CPAP 06/17/11 to 06/30/11>>Used on 7 of 14 nights with average median 6 hrs 54 min.  Average AHI 6.9 with median CPAP 6 cm H2O and 95th Percentile CPAP 7 cm H2O.  No significant airleak. Changed to CPAP 6 cm H2O 07/03/11       Allergies  Allergen Reactions   Gabapentin Swelling    Peripheral edema in the legs, feet, and arms   Riluzole Other (See Comments)    Peripheral edema in the legs, feet, arms     Immunization History  Administered Date(s) Administered   Influenza Whole 01/20/2009, 02/13/2010, 01/21/2012   Influenza, High Dose Seasonal PF 01/23/2017, 01/21/2019   Influenza,inj,quad, With Preservative 01/20/2017, 01/20/2018   Influenza-Unspecified 01/20/2017, 01/20/2018   Moderna Sars-Covid-2 Vaccination 06/23/2019, 07/21/2019   Pneumococcal Conjugate-13 07/21/2013   Pneumococcal  Polysaccharide-23 04/22/2008, 11/25/2017   Tdap 01/20/2017   Zoster Recombinat (Shingrix) 08/12/2017   Zoster, Live 04/23/2003    Past Medical History:  Diagnosis Date   Arthritis    Bradycardia    Fatty liver    Fracture, tibia, with fibula teenage yrs.    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   allopurinol (ZYLOPRIM) 100 MG tablet Take 100 mg by mouth daily.   ascorbic acid (VITAMIN C) 1000 MG tablet Take 1,000 mg by mouth daily.   aspirin EC 81 MG tablet Take 81 mg by mouth daily.   azelastine (ASTELIN) 0.1 % nasal spray Place 2 sprays into both nostrils 2 (two) times daily. Use in each nostril as directed   Cholecalciferol 125 MCG (5000 UT) capsule Take by mouth.   Coenzyme Q10 (COQ10) 400 MG CAPS Take 400 mg by mouth daily.   Cranberry 500 MG CAPS Take 500 mg by mouth 2 (two) times daily.    Cyanocobalamin (VITAMIN B-12) 2500 MCG SUBL Place 2,500 mcg under the tongue daily.   desonide (DESOWEN) 0.05 % cream Apply 1 application topically 2 (two) times daily as needed (irritation).    hydrocortisone (ANUSOL-HC) 2.5 % rectal cream Place 1 application rectally 2 (two) times daily as needed for hemorrhoids or anal itching.   indomethacin (INDOCIN) 25 MG capsule Take 25 mg by mouth daily.    losartan (COZAAR) 50 MG  tablet Take 1 tablet by mouth daily.   methocarbamol (ROBAXIN) 750 MG tablet Take 750 mg by mouth 2 (two) times daily.    Multiple Vitamin (MULTIVITAMIN WITH MINERALS) TABS Take 1 tablet by mouth daily.   Omega-3 Fatty Acids (FISH OIL TRIPLE STRENGTH) 1400 MG CAPS Take 2,800 mg  by mouth 2 (two) times daily.    omeprazole (PRILOSEC) 20 MG capsule Take 40 mg by mouth daily.    OVER THE COUNTER MEDICATION Take 1 tablet by mouth daily. Calcium citrate+D3 500-800 mg   potassium chloride SA (KLOR-CON M) 20 MEQ tablet Take 1 tablet (20 mEq total) by mouth daily.   pravastatin (PRAVACHOL) 20 MG tablet TAKE 1 TABLET BY MOUTH AT BEDTIME   RESVERATROL 100 MG CAPS Take 100 mg by mouth daily.    tiZANidine (ZANAFLEX) 4 MG tablet Take 4 mg by mouth daily.   torsemide (DEMADEX) 20 MG tablet Take 2 tablets every morning and 2 tablets 6 hours later   Turmeric (QC TUMERIC COMPLEX PO) Take by mouth.   zinc gluconate 50 MG tablet Take 50 mg by mouth daily.   [DISCONTINUED] Tiotropium Bromide-Olodaterol (STIOLTO RESPIMAT) 2.5-2.5 MCG/ACT AERS Inhale 2 puffs into the lungs daily.   Tiotropium Bromide-Olodaterol (STIOLTO RESPIMAT) 2.5-2.5 MCG/ACT AERS Inhale 2 puffs into the lungs daily.   [DISCONTINUED] Tiotropium Bromide-Olodaterol (STIOLTO RESPIMAT) 2.5-2.5 MCG/ACT AERS Inhale 2 puffs into the lungs daily.   No facility-administered encounter medications on file as of 11/28/2021.     Review of Systems  Review of Systems   Physical Exam  BP 128/70 (BP Location: Left Arm, Patient Position: Sitting, Cuff Size: Normal)   Pulse 82   Temp 98.6 F (37 C) (Oral)   Wt 276 lb 6.4 oz (125.4 kg)   SpO2 96%   BMI 39.66 kg/m   Wt Readings from Last 5 Encounters:  11/28/21 276 lb 6.4 oz (125.4 kg)  10/22/21 276 lb 12.8 oz (125.6 kg)  09/20/21 287 lb 12.8 oz (130.5 kg)  09/18/21 282 lb (127.9 kg)  01/18/21 274 lb (124.3 kg)    BMI Readings from Last 5 Encounters:  11/28/21 39.66 kg/m  10/22/21 39.72 kg/m   09/20/21 41.30 kg/m  09/18/21 40.46 kg/m  01/18/21 39.31 kg/m     Physical Exam General: well appearing, in NAD Eyes: No icterus, EOMI Neck: Supple, no JVP Pulmonary: Clear, normal work of breathing    Assessment & Plan:   Chronic cough: Preceded COVID infection but worsened after COVID infection a couple years ago.  Query possible reactive wires versus asthma given worsening after upper respiratory infection.  He has other risk factors of coughing including reflux, although symptoms well controlled with PPI do worry about silent aspiration at night particular risk given his underlying neuromuscular disease.  He also coughs with eating and drinking so sounds like aspiration with eating and drinking is also an issue which could lead to ongoing bronchial inflammation and chronic cough.  Stiolto without improvement.  Avoiding ICS given inability to gargle.  I think this is low yield given concern for aspiration.  Can consider nebulized ICS in the future.  Wheezing: Improved with dual bronchodilator via Stiolto.  Refilled today.   Return in about 4 months (around 03/30/2022).   Lanier Clam, MD 11/28/2021

## 2021-11-29 ENCOUNTER — Telehealth: Payer: Self-pay | Admitting: Family Medicine

## 2021-11-29 DIAGNOSIS — R7303 Prediabetes: Secondary | ICD-10-CM

## 2021-11-29 DIAGNOSIS — E7800 Pure hypercholesterolemia, unspecified: Secondary | ICD-10-CM

## 2021-11-29 DIAGNOSIS — Z125 Encounter for screening for malignant neoplasm of prostate: Secondary | ICD-10-CM

## 2021-11-29 NOTE — Telephone Encounter (Signed)
-----   Message from Ellamae Sia sent at 11/19/2021  3:46 PM EDT ----- Regarding: Lab orders for Tuesday, 8.15.23 Patient is scheduled for CPX labs, please order future labs, Thanks , Karna Christmas

## 2021-12-04 ENCOUNTER — Other Ambulatory Visit (INDEPENDENT_AMBULATORY_CARE_PROVIDER_SITE_OTHER): Payer: Medicare Other

## 2021-12-04 DIAGNOSIS — R7303 Prediabetes: Secondary | ICD-10-CM | POA: Diagnosis not present

## 2021-12-04 DIAGNOSIS — Z125 Encounter for screening for malignant neoplasm of prostate: Secondary | ICD-10-CM

## 2021-12-04 DIAGNOSIS — E7800 Pure hypercholesterolemia, unspecified: Secondary | ICD-10-CM

## 2021-12-04 LAB — LIPID PANEL
Cholesterol: 176 mg/dL (ref 0–200)
HDL: 38.8 mg/dL — ABNORMAL LOW (ref >39.00)
NonHDL: 137.48
Total CHOL/HDL Ratio: 5
Triglycerides: 260 mg/dL — ABNORMAL HIGH (ref 0.0–149.0)
VLDL: 52 mg/dL — ABNORMAL HIGH (ref 0.0–40.0)

## 2021-12-04 LAB — COMPREHENSIVE METABOLIC PANEL
ALT: 40 U/L (ref 0–53)
AST: 32 U/L (ref 0–37)
Albumin: 4.6 g/dL (ref 3.5–5.2)
Alkaline Phosphatase: 41 U/L (ref 39–117)
BUN: 20 mg/dL (ref 6–23)
CO2: 27 mEq/L (ref 19–32)
Calcium: 10 mg/dL (ref 8.4–10.5)
Chloride: 103 mEq/L (ref 96–112)
Creatinine, Ser: 1.21 mg/dL (ref 0.40–1.50)
GFR: 57.58 mL/min — ABNORMAL LOW (ref >60.00)
Glucose, Bld: 105 mg/dL — ABNORMAL HIGH (ref 70–99)
Potassium: 4.4 mEq/L (ref 3.5–5.1)
Sodium: 141 mEq/L (ref 135–145)
Total Bilirubin: 0.6 mg/dL (ref 0.2–1.2)
Total Protein: 6.8 g/dL (ref 6.0–8.3)

## 2021-12-04 LAB — LDL CHOLESTEROL, DIRECT: Direct LDL: 87 mg/dL

## 2021-12-04 LAB — HEMOGLOBIN A1C: Hgb A1c MFr Bld: 6.3 % (ref 4.6–6.5)

## 2021-12-04 LAB — PSA, MEDICARE: PSA: 2.06 ng/ml (ref 0.10–4.00)

## 2021-12-04 NOTE — Progress Notes (Signed)
No critical labs need to be addressed urgently. We will discuss labs in detail at upcoming office visit.   

## 2021-12-11 ENCOUNTER — Encounter: Payer: Self-pay | Admitting: Family Medicine

## 2021-12-11 ENCOUNTER — Ambulatory Visit (INDEPENDENT_AMBULATORY_CARE_PROVIDER_SITE_OTHER): Payer: Medicare Other | Admitting: Family Medicine

## 2021-12-11 VITALS — BP 122/82 | HR 75 | Temp 98.7°F | Resp 16 | Ht 70.0 in | Wt 277.1 lb

## 2021-12-11 DIAGNOSIS — G1223 Primary lateral sclerosis: Secondary | ICD-10-CM | POA: Diagnosis not present

## 2021-12-11 DIAGNOSIS — Z0000 Encounter for general adult medical examination without abnormal findings: Secondary | ICD-10-CM | POA: Diagnosis not present

## 2021-12-11 DIAGNOSIS — E7800 Pure hypercholesterolemia, unspecified: Secondary | ICD-10-CM | POA: Diagnosis not present

## 2021-12-11 DIAGNOSIS — I714 Abdominal aortic aneurysm, without rupture, unspecified: Secondary | ICD-10-CM | POA: Diagnosis not present

## 2021-12-11 DIAGNOSIS — I10 Essential (primary) hypertension: Secondary | ICD-10-CM | POA: Diagnosis not present

## 2021-12-11 DIAGNOSIS — R7303 Prediabetes: Secondary | ICD-10-CM | POA: Diagnosis not present

## 2021-12-11 NOTE — Progress Notes (Signed)
Patient ID: Edward Frank, male    DOB: 06/27/1945, 76 y.o.   MRN: [id number]  This visit was conducted in person.  BP 122/82   Pulse 75   Temp 98.7 F (37.1 C)   Resp 16   Ht '5\' 10"'$  (1.778 m)   Wt 277 lb 2 oz (125.7 kg)   SpO2 96%   BMI 39.76 kg/m    CC:  Chief Complaint  Patient presents with   Medicare Wellness    Subjective:   HPI: Edward Frank is a 76 y.o. male presenting on 12/11/2021 for Medicare Wellness The patient presents for annual medicare wellness, complete physical and review of chronic health problems. He/She also has the following acute concerns today:  I have personally reviewed the Medicare Annual Wellness questionnaire and have noted 1. [name] medical and social history 2. Their use of alcohol, tobacco or illicit drugs 3. Their current medications and supplements 4. [name] functional ability including ADL's, fall risks, home safety risks and hearing or visual             impairment. 5. Diet and physical activities 6. Evidence for depression or mood disorders 7.         Updated provider list Cognitive evaluation was performed and recorded on pt medicare questionnaire form. The patients weight, height, BMI and visual acuity have been recorded in the chart   I have made referrals, counseling and provided education to the patient based review of the above and I have provided the pt with a written personalized care plan for preventive services. .  Documentation of this information was scanned into the electronic record under the media tab.   Advance directives and end of life planning reviewed in detail with patient and documented in EMR. Patient given handout on advance care directives if needed. HCPOA and living will updated if needed.  No falls in last 12 months.  Hearing Screening   '1000Hz'$  '2000Hz'$  '4000Hz'$  '5000Hz'$   Right ear Pass Pass Pass Pass  Left ear Pass Pass Pass Pass   Vision Screening   Right eye Left eye Both eyes   Without correction '20/20 20/20 20/20 '$  With correction     Comments: [facility]   PHQ2:  PLS/upper motor dominant AS: Continue slow progression change.  Was followed at [place]. Now followed by Dr. Nicoletta Dress   at [facility]. He has walker and cane.  less endurance, weaker over all. 12/05/2020 OV Dr. Nicoletta Dress reviewed re: oropharyngeal dysphagia.[name] referred to speech therapy  Hypertension:  Well controlled on losartan 50 mg p.o. daily BP Readings from Last 3 Encounters:  12/11/21 122/82  11/28/21 128/70  10/22/21 122/74  Using medication without problems or lightheadedness:  none Chest pain with exertion: none Edema:resolved Short of breath: stable Average home BPs: Other issues:  Elevated Cholesterol: LDL at goal on pravastatin 20 mg p.o. daily Lab Results  Component Value Date   CHOL 176 12/04/2021   HDL 38.80 (L) 12/04/2021   LDLCALC 85 12/03/2018   LDLDIRECT 87.0 12/04/2021   TRIG 260.0 (H) 12/04/2021   CHOLHDL 5 12/04/2021  Using medications without problems: none Muscle aches:  none Diet compliance: moderate Exercise: limited Other complaints:  Prediabetes  Lab Results  Component Value Date   HGBA1C 6.3 12/04/2021    Wt Readings from Last 3 Encounters:  12/11/21 277 lb 2 oz (125.7 kg)  11/28/21 276 lb 6.4 oz (125.4 kg)  10/22/21 276 lb 12.8 oz (125.6 kg)  AAA:  3.6 cm on CT in Feb of 2019.  per cardioloy note, no further imaging required.  Reviewed Cardiology note 09/20/2021 Noted increased pedal edema and DOE.   Started on demedex 40 mg daily, with potassium.. had large diuresis and now is on 10 mg daily.   ECHO: 09/2021 EF 60-65%    Relevant past medical, surgical, family and social history reviewed and updated as indicated. Interim medical history since our last visit reviewed. Allergies and medications reviewed and updated. Outpatient Medications Prior to Visit  Medication Sig Dispense Refill   Albuterol Sulfate (PROAIR RESPICLICK) 109 (90 Base) MCG/ACT AEPB  Inhale 1-2 puffs into the lungs every 4 (four) hours as needed.     allopurinol (ZYLOPRIM) 100 MG tablet Take 100 mg by mouth daily.     ascorbic acid (VITAMIN C) 1000 MG tablet Take 1,000 mg by mouth daily.     aspirin EC 81 MG tablet Take 81 mg by mouth daily.     azelastine (ASTELIN) 0.1 % nasal spray Place 2 sprays into both nostrils 2 (two) times daily. Use in each nostril as directed 30 mL 12   Cholecalciferol 125 MCG (5000 UT) capsule Take by mouth.     Coenzyme Q10 (COQ10) 400 MG CAPS Take 400 mg by mouth daily.     Cranberry 500 MG CAPS Take 500 mg by mouth 2 (two) times daily.      Cyanocobalamin (VITAMIN B-12) 2500 MCG SUBL Place 2,500 mcg under the tongue daily.     desonide (DESOWEN) 0.05 % cream Apply 1 application topically 2 (two) times daily as needed (irritation).      hydrocortisone (ANUSOL-HC) 2.5 % rectal cream Place 1 application rectally 2 (two) times daily as needed for hemorrhoids or anal itching.     indomethacin (INDOCIN) 25 MG capsule Take 25 mg by mouth daily.      losartan (COZAAR) 50 MG tablet Take 1 tablet by mouth daily.     methocarbamol (ROBAXIN) 750 MG tablet Take 750 mg by mouth 2 (two) times daily.      Multiple Vitamin (MULTIVITAMIN WITH MINERALS) TABS Take 1 tablet by mouth daily.     Omega-3 Fatty Acids (FISH OIL TRIPLE STRENGTH) 1400 MG CAPS Take 2,800 mg by mouth 2 (two) times daily.      omeprazole (PRILOSEC) 20 MG capsule Take 40 mg by mouth daily.      OVER THE COUNTER MEDICATION Take 1 tablet by mouth daily. Calcium citrate+D3 500-800 mg     potassium chloride SA (KLOR-CON M) 20 MEQ tablet Take 1 tablet (20 mEq total) by mouth daily. 90 tablet 3   pravastatin (PRAVACHOL) 20 MG tablet TAKE 1 TABLET BY MOUTH AT BEDTIME 90 tablet 3   RESVERATROL 100 MG CAPS Take 100 mg by mouth daily.      Tiotropium Bromide-Olodaterol (STIOLTO RESPIMAT) 2.5-2.5 MCG/ACT AERS Inhale 2 puffs into the lungs daily. 1 each 11   tiZANidine (ZANAFLEX) 4 MG tablet Take 4 mg  by mouth daily.     torsemide (DEMADEX) 20 MG tablet Take 2 tablets every morning and 2 tablets 6 hours later 360 tablet 3   Turmeric (QC TUMERIC COMPLEX PO) Take by mouth.     zinc gluconate 50 MG tablet Take 50 mg by mouth daily.     No facility-administered medications prior to visit.     Per HPI unless specifically indicated in ROS section below Review of Systems  Constitutional:  Negative for fatigue and fever.  HENT:  Negative for ear pain.   Eyes:  Negative for pain.  Respiratory:  Negative for cough and shortness of breath.   Cardiovascular:  Negative for chest pain, palpitations and leg swelling.  Gastrointestinal:  Negative for abdominal pain.  Genitourinary:  Negative for dysuria.  Musculoskeletal:  Negative for arthralgias.  Neurological:  Negative for syncope, light-headedness and headaches.  Psychiatric/Behavioral:  Negative for dysphoric mood.    Objective:  BP 122/82   Pulse 75   Temp 98.7 F (37.1 C)   Resp 16   Ht '5\' 10"'$  (1.778 m)   Wt 277 lb 2 oz (125.7 kg)   SpO2 96%   BMI 39.76 kg/m   Wt Readings from Last 3 Encounters:  12/11/21 277 lb 2 oz (125.7 kg)  11/28/21 276 lb 6.4 oz (125.4 kg)  10/22/21 276 lb 12.8 oz (125.6 kg)      Physical Exam    Results for orders placed or performed in visit on 12/04/21  PSA, Medicare  Result Value Ref Range   PSA 2.06 0.10 - 4.00 ng/ml  Comprehensive metabolic panel  Result Value Ref Range   Sodium 141 135 - 145 mEq/L   Potassium 4.4 3.5 - 5.1 mEq/L   Chloride 103 96 - 112 mEq/L   CO2 27 19 - 32 mEq/L   Glucose, Bld 105 (H) 70 - 99 mg/dL   BUN 20 6 - 23 mg/dL   Creatinine, Ser 1.21 0.40 - 1.50 mg/dL   Total Bilirubin 0.6 0.2 - 1.2 mg/dL   Alkaline Phosphatase 41 39 - 117 U/L   AST 32 0 - 37 U/L   ALT 40 0 - 53 U/L   Total Protein 6.8 6.0 - 8.3 g/dL   Albumin 4.6 3.5 - 5.2 g/dL   GFR 57.58 (L) >60.00 mL/min   Calcium 10.0 8.4 - 10.5 mg/dL  Lipid panel  Result Value Ref Range   Cholesterol 176 0 - 200  mg/dL   Triglycerides 260.0 (H) 0.0 - 149.0 mg/dL   HDL 38.80 (L) >39.00 mg/dL   VLDL 52.0 (H) 0.0 - 40.0 mg/dL   Total CHOL/HDL Ratio 5    NonHDL 137.48   Hemoglobin A1c  Result Value Ref Range   Hgb A1c MFr Bld 6.3 4.6 - 6.5 %  LDL cholesterol, direct  Result Value Ref Range   Direct LDL 87.0 mg/dL     COVID 19 screen:  No recent travel or known exposure to COVID19 The patient denies respiratory symptoms of COVID 19 at this time. The importance of social distancing was discussed today.   Assessment and Plan [name] preventative maintenance and recommended screening tests for an annual wellness exam were reviewed in full today. Brought up to date unless services declined.  Counselled on the importance of diet, exercise, and its role in overall health and mortality. [name] FH and SH was reviewed, including their home life, tobacco status, and drug and alcohol status.   Vaccines:  Uptodate pneumovax/prevnar and  shingles x 2  ,tdap, COVID x 2 Prostate: Father with prostate cancer age 3s.   Discussed in detail, have chose to check PSA and rectal exam yearly. Lab Results  Component Value Date   PSA 2.06 12/04/2021   PSA 2.07 12/01/2020   PSA 1.61 03/09/2020  Colon: Sister with [facility] 07/[facility], no further needed given age. nonsmoker Hep C screening: done   Problem List Items Addressed This Visit     AAA (abdominal aortic aneurysm) without rupture ([place])  No further imaging required per cardiology.  Dr. Percival Spanish.      HYPERCHOLESTEROLEMIA    Stable, chronic.  Continue current medication.   Pravastatin 20 mg daily      HYPERTENSION, MILD    Stable, chronic.  Continue current medication.   Losartan 50 mg p.o. daily      Prediabetes    Chronic, stable control. Encouraged exercise, weight loss, healthy eating habits.       Primary lateral sclerosis (HCC)    Progressive, significantly limiting ambulation and balance.  Using cane and  sometimes scooter.  [facility]      Other Visit Diagnoses     Medicare annual wellness visit, subsequent    -  Primary        Eliezer Lofts, MD

## 2021-12-11 NOTE — Assessment & Plan Note (Signed)
Chronic, stable control. Encouraged exercise, weight loss, healthy eating habits.

## 2021-12-11 NOTE — Patient Instructions (Addendum)
Can try Pepcid AC twice daily for chronic cough in addition to the prilosec.  Increasing pravastatin to 40 mg daily...  if increase muscle ache go back to 20 mg daily.  Return for lab only cholesterol recheck in 3 month.  Work on low carb diet and low cholesterol diet.

## 2021-12-11 NOTE — Assessment & Plan Note (Signed)
Stable, chronic.  Continue current medication.  Pravastatin 20 mg daily 

## 2021-12-11 NOTE — Assessment & Plan Note (Signed)
No further imaging required per cardiology.  Dr. Percival Spanish.

## 2021-12-11 NOTE — Assessment & Plan Note (Signed)
Stable, chronic.  Continue current medication.  Losartan 50 mg p.o. daily 

## 2021-12-11 NOTE — Assessment & Plan Note (Signed)
Progressive, significantly limiting ambulation and balance.  Using cane and sometimes scooter.  Followed at [facility]

## 2022-01-06 NOTE — Progress Notes (Unsigned)
Cardiology Office Note   Date:  01/07/2022   ID:  Edward Frank, DOB [birth year], MRN [id number]  PCP:  [name], MD  Cardiologist:   Minus Breeding, MD    Chief Complaint  Patient presents with   Edema      History of Present Illness: Edward Frank is a 76 y.o. male who presents for follow up of syncope.  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  He has had chronic leg edema and some dyspnea with activity.  He has ALS and gets around slowly and mostly in a motorized scooter.   After calling to discuss syncope I applied a monitor that demonstrated NSVT although he did not have symptoms with this.  He had a normal EF on echo in June 2020.   Echo in June demonstrated NL LV and RV function.    He is managed at [facility] for ALS.   I switched him to [facility] at the last appointment because he was having increased swelling.  This has actually much improved his edema.  In fact I was going to use a higher dose but he can only tolerate 10 mg a day.  This has reduced his weight down by about 7 or 8 pounds and his swelling is only at the top of his feet.  He gets around slowly with his cane.  He is about to start Urso deoxycholic acid.  He was having a cough and started a new medicine per [facility] and seems to be doing better with this.  This is [place]   Past Medical History:  Diagnosis Date   Arthritis    Bradycardia    Fatty liver    Fracture, tibia, with fibula teenage yrs.    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)    Right bundle branch block    Syncope     Past Surgical History:  Procedure Laterality Date   CHOLECYSTECTOMY N/A 06/13/2016    Procedure: LAPAROSCOPIC CHOLECYSTECTOMY;  Surgeon: Arta Bruce Kinsinger, MD;  [facility];  Service: General;  Laterality: N/A;   COLONOSCOPY WITH PROPOFOL N/A 08/02/2019   Procedure: COLONOSCOPY WITH PROPOFOL;  Surgeon: Daneil Dolin, MD;  [facility];  Service: Endoscopy;  Laterality: N/A;  10:00am   EYE SURGERY     Cataracts bil   FOOT SURGERY  11-2008   hammer toe and bunion   HERNIA REPAIR  12-2001   inguinal hernia bilateral   POLYPECTOMY  08/02/2019   Procedure: POLYPECTOMY;  Surgeon: Daneil Dolin, MD;  [facility];  Service: Endoscopy;;   REVERSE SHOULDER ARTHROPLASTY Left 05/28/2018   Procedure: REVERSE SHOULDER ARTHROPLASTY;  Surgeon: Justice Britain, MD;  [facility];  Service: Orthopedics;  Laterality: Left;  130mn   SHOULDER ARTHROSCOPY WITH ROTATOR CUFF REPAIR AND SUBACROMIAL DECOMPRESSION Right 06/18/2012   Procedure: RIGHT SHOULDER ARTHROSCOPY WITH SUBACROMIAL DECOMPRESSION AND DISTAL CLAVICLE RESECTION AND ROTATOR CUFF REPAIR;  Surgeon: KMarin Shutter MD;  [facility]  Service: Orthopedics;  Laterality: Right;   SHOULDER SURGERY  1977   Luxating      Current Outpatient Medications  Medication Sig Dispense Refill   Albuterol Sulfate (PROAIR RESPICLICK) 161 (90 Base) MCG/ACT AEPB Inhale 1-2 puffs into the lungs every 4 (four) hours as needed.     allopurinol (ZYLOPRIM) 100 MG tablet Take 100 mg by mouth daily.     ascorbic acid (VITAMIN C) 1000 MG tablet Take 1,000 mg by mouth daily.     aspirin EC 81 MG tablet Take 81 mg by mouth daily.     azelastine (ASTELIN) 0.1 % nasal spray Place 2 sprays into both nostrils 2 (two) times daily. Use in each nostril as directed 30 mL 12   Cholecalciferol 125 MCG (5000 UT) capsule Take by mouth.     Coenzyme Q10 (COQ10) 400 MG CAPS Take 400 mg by mouth daily.     Cranberry 500 MG CAPS Take 500 mg by mouth 2 (two) times daily.      Cyanocobalamin (VITAMIN B-12) 2500 MCG SUBL Place 2,500 mcg under the  tongue daily.     desonide (DESOWEN) 0.05 % cream Apply 1 application topically 2 (two) times daily as needed (irritation).      hydrocortisone (ANUSOL-HC) 2.5 % rectal cream Place 1 application rectally 2 (two) times daily as needed for hemorrhoids or anal itching.     indomethacin (INDOCIN) 25 MG capsule Take 25 mg by mouth daily.      losartan (COZAAR) 50 MG tablet Take 1 tablet by mouth daily.     methocarbamol (ROBAXIN) 750 MG tablet Take 750 mg by mouth 2 (two) times daily.      Multiple Vitamin (MULTIVITAMIN WITH MINERALS) TABS Take 1 tablet by mouth daily.     Omega-3 Fatty Acids (FISH OIL TRIPLE STRENGTH) 1400 MG CAPS Take 2,800 mg by mouth 2 (two) times daily.      omeprazole (PRILOSEC) 20 MG capsule Take 40 mg by mouth daily.      OVER THE COUNTER MEDICATION Take 1 tablet by mouth daily. Calcium citrate+D3 500-800 mg     potassium chloride SA (KLOR-CON M) 20 MEQ tablet Take 1 tablet (20 mEq total) by mouth daily. 90 tablet 3   pravastatin (PRAVACHOL) 20 MG tablet TAKE 1 TABLET BY MOUTH AT BEDTIME (Patient taking differently: Take 10 mg by mouth daily.) 90 tablet 3   RESVERATROL 100 MG CAPS Take 100 mg by mouth daily.      Tiotropium Bromide-Olodaterol (STIOLTO RESPIMAT) 2.5-2.5 MCG/ACT AERS Inhale 2 puffs into the lungs daily. 1 each 11   tiZANidine (ZANAFLEX) 4 MG tablet Take 4 mg by mouth daily.     torsemide (DEMADEX) 20 MG tablet Take 2 tablets every morning and 2 tablets 6 hours later (Patient taking differently: Take 10 mg by mouth once. Take 2 tablets every morning and 2 tablets 6 hours later) 360 tablet 3   Turmeric (QC TUMERIC COMPLEX PO) Take by mouth.     zinc gluconate 50 MG tablet Take 50 mg by mouth daily.     No current facility-administered medications for this visit.    Allergies:   Gabapentin and Riluzole    ROS:  Please see the history of present illness.   Otherwise, review of systems are positive for none.   All other systems are reviewed and negative.     PHYSICAL EXAM: VS:  BP 130/88   Pulse 81   Ht '5\' 10"'$  (1.778 m)   Wt 277 lb (125.6 kg)   SpO2 95%   BMI 39.75 kg/m  , BMI Body mass index is 39.75 kg/m.  GENERAL:  Well appearing NECK:  No jugular venous distention, waveform within normal limits, carotid upstroke brisk and symmetric, no bruits, no thyromegaly LUNGS:  Clear to auscultation bilaterally CHEST:  Unremarkable HEART:  PMI not displaced or sustained,S1 and S2 within normal limits, no S3, no S4, no clicks, no rubs, no murmurs ABD:  Flat, positive bowel sounds normal in frequency in pitch, no bruits, no rebound, no guarding, no midline pulsatile mass, no hepatomegaly, no splenomegaly EXT:  2 plus pulses throughout, mild leg edema, no cyanosis no clubbing   EKG:  EKG is  not ordered today.   Recent Labs: 12/04/2021: ALT 40; BUN 20; Creatinine, Ser 1.21; Potassium 4.4; Sodium 141    Lipid Panel    Component Value Date/Time   CHOL 176 12/04/2021 0853   TRIG 260.0 (H) 12/04/2021 0853   HDL 38.80 (L) 12/04/2021 0853   CHOLHDL 5 12/04/2021 0853   VLDL 52.0 (H) 12/04/2021 0853   LDLCALC 85 12/03/2018 0934   LDLDIRECT 87.0 12/04/2021 0853      Wt Readings from Last 3 Encounters:  01/07/22 277 lb (125.6 kg)  12/11/21 277 lb 2 oz (125.7 kg)  11/28/21 276 lb 6.4 oz (125.4 kg)      Other studies Reviewed: Additional studies/ records that were reviewed today include Labs Review of the above records demonstrates: See elsewhere   ASSESSMENT AND PLAN:  EDEMA:   His lower extremity swelling is much improved.  He is up-to-date with blood work.  No change in therapy.    HTN:   His blood pressure is at target.  No change in therapy.    AAA:  This was 41 mm on echo in June.  I will follow this up again in June of next year.  COUGH:   This is improved with Dulera.  No change in therapy.  Current medicines are reviewed at length with the patient today.  The patient does not have concerns regarding medicines.  The  following changes have been made:    None Labs/ tests ordered today include:   None  No orders of the defined types were placed in this encounter.   Disposition:   FU with me in 6 months   Signed, Minus Breeding, MD  01/07/2022 11:01 AM    [facility]

## 2022-01-07 ENCOUNTER — Ambulatory Visit: Payer: Medicare Other | Attending: Cardiology | Admitting: Cardiology

## 2022-01-07 ENCOUNTER — Encounter: Payer: Self-pay | Admitting: Cardiology

## 2022-01-07 VITALS — BP 130/88 | HR 81 | Ht 70.0 in | Wt 277.0 lb

## 2022-01-07 DIAGNOSIS — I7121 Aneurysm of the ascending aorta, without rupture: Secondary | ICD-10-CM | POA: Diagnosis not present

## 2022-01-07 DIAGNOSIS — M7989 Other specified soft tissue disorders: Secondary | ICD-10-CM | POA: Diagnosis not present

## 2022-01-07 DIAGNOSIS — I10 Essential (primary) hypertension: Secondary | ICD-10-CM | POA: Diagnosis not present

## 2022-01-07 NOTE — Patient Instructions (Signed)
Medication Instructions:  The current medical regimen is effective;  continue present plan and medications.  *If you need a refill on your cardiac medications before your next appointment, please call your pharmacy*   Follow-Up: At [facility], you and your health needs are our priority.  As part of our continuing mission to provide you with exceptional heart care, we have created designated Provider Care Teams.  These Care Teams include your primary Cardiologist (physician) and Advanced Practice Providers (APPs -  Physician Assistants and Nurse Practitioners) who all work together to provide you with the care you need, when you need it.  We recommend signing up for the patient portal called "MyChart".  Sign up information is provided on this After Visit Summary.  MyChart is used to connect with patients for Virtual Visits (Telemedicine).  Patients are able to view lab/test results, encounter notes, upcoming appointments, etc.  Non-urgent messages can be sent to your provider as well.   To learn more about what you can do with MyChart, go to [email or web address].    Your next appointment:   6 month(s)  The format for your next appointment:   In Person  Provider:   James Hochrein, MD          

## 2022-03-09 ENCOUNTER — Telehealth: Payer: Self-pay | Admitting: Family Medicine

## 2022-03-09 DIAGNOSIS — E7800 Pure hypercholesterolemia, unspecified: Secondary | ICD-10-CM

## 2022-03-09 NOTE — Telephone Encounter (Signed)
-----   Message from Velna Hatchet, RT sent at 03/04/2022  3:07 PM EST ----- Regarding: Wed 11/22 labs Lab orders needed for lab appt on 03/13/22.  Thanks, Anda Kraft

## 2022-03-13 ENCOUNTER — Other Ambulatory Visit (INDEPENDENT_AMBULATORY_CARE_PROVIDER_SITE_OTHER): Payer: Medicare Other

## 2022-03-13 DIAGNOSIS — E7800 Pure hypercholesterolemia, unspecified: Secondary | ICD-10-CM

## 2022-03-13 LAB — LDL CHOLESTEROL, DIRECT: Direct LDL: 80 mg/dL

## 2022-03-13 LAB — HEPATIC FUNCTION PANEL
ALT: 30 U/L (ref 0–53)
AST: 25 U/L (ref 0–37)
Albumin: 4.5 g/dL (ref 3.5–5.2)
Alkaline Phosphatase: 48 U/L (ref 39–117)
Bilirubin, Direct: 0.1 mg/dL (ref 0.0–0.3)
Total Bilirubin: 0.5 mg/dL (ref 0.2–1.2)
Total Protein: 6.9 g/dL (ref 6.0–8.3)

## 2022-03-13 LAB — LIPID PANEL
Cholesterol: 149 mg/dL (ref 0–200)
HDL: 39.7 mg/dL (ref >39.00)
NonHDL: 109.6
Total CHOL/HDL Ratio: 4
Triglycerides: 226 mg/dL — ABNORMAL HIGH (ref 0.0–149.0)
VLDL: 45.2 mg/dL — ABNORMAL HIGH (ref 0.0–40.0)

## 2022-03-18 ENCOUNTER — Encounter: Payer: Self-pay | Admitting: *Deleted

## 2022-03-22 ENCOUNTER — Telehealth: Payer: Self-pay | Admitting: Family Medicine

## 2022-03-22 MED ORDER — PRAVASTATIN SODIUM 40 MG PO TABS: 40.0000 mg | ORAL_TABLET | Freq: Every day | ORAL | 3 refills | Status: AC

## 2022-03-22 NOTE — Telephone Encounter (Signed)
Mrs. Rasmusson notified as instructed by telephone.  Patient is currently taking 20 mg 2 tablets daily.  Patient is tolerating it well.   Would like Rx sent to pharmacy for 40 mg tablets so he only has to take one tablet daily.  Rx sent as requested.

## 2022-03-22 NOTE — Addendum Note (Signed)
Addended by: Carter Kitten on: 03/22/2022 04:14 PM   Modules accepted: Orders

## 2022-03-22 NOTE — Telephone Encounter (Signed)
Spoke with Ms. Leather.  She was following up on Mr. Markin cholesterol results.  She wanted to know if Dr. Diona Browner was going to make any adjustments to his Pravastatin dose or keep it the same?  (See result note)  Also she was asking if our office was giving the RSV vaccines.  I advised they would need to get that vaccine done at their pharmacy.

## 2022-03-22 NOTE — Telephone Encounter (Signed)
Patient called and returned a call back from Butch Penny and also had a question if the Dr. Diona Browner was giving RSVP shots.

## 2022-03-22 NOTE — Telephone Encounter (Signed)
Cholesterol is almost at goal on the pravastatin 20 mg daily.  Please update medication list to reflect that this is the dose he is taking. Contact patient/wife: Given he is almost at goal and tolerating the medication I would continue at this dose for now.  We can consider reevaluation in the future.  No medication change

## 2022-03-22 NOTE — Telephone Encounter (Signed)
Tried to return call.  Phone line is busy.

## 2022-03-22 NOTE — Telephone Encounter (Signed)
Pt wife called requesting a call back regarding medication and lab results #  [phone]

## 2022-04-10 DIAGNOSIS — L304 Erythema intertrigo: Secondary | ICD-10-CM | POA: Diagnosis not present

## 2022-04-10 DIAGNOSIS — L218 Other seborrheic dermatitis: Secondary | ICD-10-CM | POA: Diagnosis not present

## 2022-05-08 DIAGNOSIS — L308 Other specified dermatitis: Secondary | ICD-10-CM | POA: Diagnosis not present

## 2022-05-08 DIAGNOSIS — L42 Pityriasis rosea: Secondary | ICD-10-CM | POA: Diagnosis not present

## 2022-05-08 DIAGNOSIS — L820 Inflamed seborrheic keratosis: Secondary | ICD-10-CM | POA: Diagnosis not present

## 2022-07-08 ENCOUNTER — Ambulatory Visit: Payer: Medicare Other | Admitting: Cardiology

## 2022-07-08 NOTE — Progress Notes (Unsigned)
Cardiology Office Note   Date:  07/09/2022   ID:  Edward Frank, DOB Jan 10, 1946, MRN [id number]  PCP:  [name], MD  Cardiologist:   Minus Breeding, MD    Chief Complaint  Patient presents with   Edema      History of Present Illness: Edward Frank is a 77 y.o. male who presents for follow up of syncope.  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  He has had chronic leg edema and some dyspnea with activity.  He has ALS and gets around slowly and mostly in a motorized scooter.   After calling to discuss syncope I applied a monitor that demonstrated NSVT although he did not have symptoms with this.  He had a normal EF on echo in June 2020.   Echo in June 2023 demonstrated NL LV and RV function.    He is managed at [facility] for ALS.   Previously I had switched him to [facility].  He did much better with this regarding his edema.  He usually gets around in a wheelchair but today he walking with a cane.  He has no acute shortness of breath, PND or orthopnea.  He has no new palpitations, presyncope or syncope.  He continues to be bothered by chronic cough which is limiting his quality of life.   Past Medical History:  Diagnosis Date   Arthritis    Bradycardia    Fatty liver    Fracture, tibia, with fibula teenage yrs.    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   OSA (obstructive sleep apnea) 01/22/2011   PONV (postoperative nausea and vomiting)    Primary lateral sclerosis ([place])    Right bundle branch block    Syncope     Past Surgical History:  Procedure Laterality Date   CHOLECYSTECTOMY N/A 06/13/2016   Procedure: LAPAROSCOPIC CHOLECYSTECTOMY;  Surgeon: Arta Bruce Kinsinger, MD;  [facility];  Service: General;  Laterality: N/A;   COLONOSCOPY WITH PROPOFOL N/A  08/02/2019   Procedure: COLONOSCOPY WITH PROPOFOL;  Surgeon: Daneil Dolin, MD;  [facility];  Service: Endoscopy;  Laterality: N/A;  10:00am   EYE SURGERY     Cataracts bil   FOOT SURGERY  11-2008   hammer toe and bunion   HERNIA REPAIR  12-2001   inguinal hernia bilateral   POLYPECTOMY  08/02/2019   Procedure: POLYPECTOMY;  Surgeon: Daneil Dolin, MD;  [facility];  Service: Endoscopy;;   REVERSE SHOULDER ARTHROPLASTY Left 05/28/2018   Procedure: REVERSE SHOULDER ARTHROPLASTY;  Surgeon: Justice Britain, MD;  [facility];  Service: Orthopedics;  Laterality: Left;  168min   SHOULDER ARTHROSCOPY WITH ROTATOR CUFF REPAIR AND SUBACROMIAL DECOMPRESSION Right 06/18/2012   Procedure: RIGHT SHOULDER ARTHROSCOPY WITH SUBACROMIAL DECOMPRESSION AND DISTAL CLAVICLE RESECTION AND ROTATOR CUFF REPAIR;  Surgeon: Marin Shutter, MD;  Location: [place];  Service: Orthopedics;  Laterality: Right;   SHOULDER SURGERY  1977   Luxating      Current Outpatient Medications  Medication Sig Dispense Refill   Albuterol Sulfate (PROAIR RESPICLICK) 123XX123 (90 Base) MCG/ACT AEPB Inhale 1-2 puffs into the lungs every 4 (four) hours as needed.     allopurinol (ZYLOPRIM) 100 MG tablet Take 100 mg by  mouth daily.     ascorbic acid (VITAMIN C) 1000 MG tablet Take 1,000 mg by mouth daily.     aspirin EC 81 MG tablet Take 81 mg by mouth daily.     azelastine (ASTELIN) 0.1 % nasal spray Place 2 sprays into both nostrils 2 (two) times daily. Use in each nostril as directed 30 mL 12   Baclofen 5 MG TABS Take 15 mg by mouth in the morning and at bedtime.     chlorpheniramine-HYDROcodone (TUSSIONEX) 10-8 MG/5ML Take 5 mLs by mouth every 12 (twelve) hours as needed for cough. 100 mL 0   Cholecalciferol 125 MCG (5000 UT) capsule Take by mouth.     Coenzyme Q10 (COQ10) 400 MG CAPS Take 400 mg by mouth daily.     Cranberry 500 MG CAPS Take 500 mg by mouth 2 (two) times daily.      Cyanocobalamin (VITAMIN B-12)  2500 MCG SUBL Place 2,500 mcg under the tongue daily.     desonide (DESOWEN) 0.05 % cream Apply 1 application topically 2 (two) times daily as needed (irritation).      hydrocortisone (ANUSOL-HC) 2.5 % rectal cream Place 1 application rectally 2 (two) times daily as needed for hemorrhoids or anal itching.     indomethacin (INDOCIN) 25 MG capsule Take 25 mg by mouth daily.      losartan (COZAAR) 50 MG tablet Take 1 tablet by mouth daily.     methocarbamol (ROBAXIN) 750 MG tablet Take 750 mg by mouth 2 (two) times daily.      mometasone-formoterol (DULERA) 100-5 MCG/ACT AERO Inhale 2 puffs into the lungs 2 (two) times daily.     Multiple Vitamin (MULTIVITAMIN WITH MINERALS) TABS Take 1 tablet by mouth daily.     Omega-3 Fatty Acids (FISH OIL TRIPLE STRENGTH) 1400 MG CAPS Take 2,800 mg by mouth 2 (two) times daily.      omeprazole (PRILOSEC) 20 MG capsule Take 40 mg by mouth daily.      OVER THE COUNTER MEDICATION Take 1 tablet by mouth daily. Calcium citrate+D3 500-800 mg     potassium chloride SA (KLOR-CON M) 20 MEQ tablet Take 1 tablet (20 mEq total) by mouth daily. 90 tablet 3   pravastatin (PRAVACHOL) 40 MG tablet Take 1 tablet (40 mg total) by mouth daily. 90 tablet 3   RESVERATROL 100 MG CAPS Take 100 mg by mouth daily.      torsemide (DEMADEX) 20 MG tablet Take 2 tablets every morning and 2 tablets 6 hours later (Patient taking differently: Take 10 mg by mouth once. Take 2 tablets every morning and 2 tablets 6 hours later) 360 tablet 3   Turmeric (QC TUMERIC COMPLEX PO) Take by mouth.     zinc gluconate 50 MG tablet Take 50 mg by mouth daily.     No current facility-administered medications for this visit.    Allergies:   Gabapentin and Riluzole    ROS:  Please see the history of present illness.   Otherwise, review of systems are positive for none.   All other systems are reviewed and negative.    PHYSICAL EXAM: VS:  BP 126/80 (BP Location: Left Arm, Patient Position: Sitting, Cuff  Size: Large)   Pulse 76   Ht 5\' 10"  (1.778 m)   Wt 278 lb (126.1 kg)   BMI 39.89 kg/m  , BMI Body mass index is 39.89 kg/m.  GENERAL:  Well appearing NECK:  No jugular venous distention, waveform within normal limits, carotid upstroke  brisk and symmetric, no bruits, no thyromegaly LUNGS:  Clear to auscultation bilaterally CHEST:  Unremarkable HEART:  PMI not displaced or sustained,S1 and S2 within normal limits, no S3, no S4, no clicks, no rubs, no murmurs ABD:  Flat, positive bowel sounds normal in frequency in pitch, no bruits, no rebound, no guarding, no midline pulsatile mass, no hepatomegaly, no splenomegaly, umbilical hernia, obese EXT:  2 plus pulses throughout, mild bilateral leg edema, no cyanosis no clubbing  EKG:  EKG is    ordered today. Sinus rhythm, rate 76, incomplete right bundle branch block, axis deviation, no acute ST-T wave changes.  Recent Labs: 12/04/2021: BUN 20; Creatinine, Ser 1.21; Potassium 4.4; Sodium 141 03/13/2022: ALT 30    Lipid Panel    Component Value Date/Time   CHOL 149 03/13/2022 0808   TRIG 226.0 (H) 03/13/2022 0808   HDL 39.70 03/13/2022 0808   CHOLHDL 4 03/13/2022 0808   VLDL 45.2 (H) 03/13/2022 0808   LDLCALC 85 12/03/2018 0934   LDLDIRECT 80.0 03/13/2022 0808      Wt Readings from Last 3 Encounters:  07/09/22 278 lb (126.1 kg)  01/07/22 277 lb (125.6 kg)  12/11/21 277 lb 2 oz (125.7 kg)      Other studies Reviewed: Additional studies/ records that were reviewed today include Labs Review of the above records demonstrates: See elsewhere   ASSESSMENT AND PLAN:  EDEMA: This seems to be improved on current dose of Demadex.  No change in therapy.  I will check a basic metabolic profile.  HTN:   His blood pressure is at target.  No change in therapy.   Asc AA:  This was 41 mm on echo in June 2023.  No further imaging this year and I will likely repeat a study next year.[name]  COUGH:   I had a long discussion with the patient and  his wife about judicious and cautious use of Tussionex.  I think this is quality-of-life issue and I will give him a limited prescription.  Eval aortic aneurysm: This was 3.9 cm 5 years ago.  I will follow-up with a repeat abdominal ultrasound. Current medicines are reviewed at length with the patient today.  The patient does not have concerns regarding medicines.  The following changes have been made:     Labs/ tests ordered today include:     Orders Placed This Encounter  Procedures   Basic metabolic panel   EKG [id number]   VAS [place] AAA DUPLEX    Disposition:   FU with me in 12 months   Signed, Minus Breeding, MD  07/09/2022 6:21 PM    [facility]

## 2022-07-09 ENCOUNTER — Ambulatory Visit: Payer: Medicare Other | Attending: Cardiology | Admitting: Cardiology

## 2022-07-09 ENCOUNTER — Encounter: Payer: Self-pay | Admitting: Cardiology

## 2022-07-09 VITALS — BP 126/80 | HR 76 | Ht 70.0 in | Wt 278.0 lb

## 2022-07-09 DIAGNOSIS — I10 Essential (primary) hypertension: Secondary | ICD-10-CM

## 2022-07-09 DIAGNOSIS — I714 Abdominal aortic aneurysm, without rupture, unspecified: Secondary | ICD-10-CM

## 2022-07-09 DIAGNOSIS — M7989 Other specified soft tissue disorders: Secondary | ICD-10-CM | POA: Diagnosis not present

## 2022-07-09 DIAGNOSIS — R058 Other specified cough: Secondary | ICD-10-CM | POA: Diagnosis not present

## 2022-07-09 MED ORDER — HYDROCOD POLI-CHLORPHE POLI ER 10-8 MG/5ML PO SUER: 5.0000 mL | Freq: Two times a day (BID) | ORAL | 0 refills | Status: DC | PRN

## 2022-07-09 NOTE — Patient Instructions (Signed)
Medication Instructions:  Take Tussinex (49ml) twice daily AS NEEDED  *If you need a refill on your cardiac medications before your next appointment, please call your pharmacy*   Lab Work: BMET today  If you have labs (blood work) drawn today and your tests are completely normal, you will receive your results only by: [place] (if you have MyChart) OR A paper copy in the mail If you have any lab test that is abnormal or we need to change your treatment, we will call you to review the results.   Testing/Procedures: Your physician has requested that you have an abdominal aorta duplex. During this test, an ultrasound is used to evaluate the aorta. Allow 30 minutes for this exam. Do not eat after midnight the day before and avoid carbonated beverages  Follow-Up: At [facility], you and your health needs are our priority.  As part of our continuing mission to provide you with exceptional heart care, we have created designated Provider Care Teams.  These Care Teams include your primary Cardiologist (physician) and Advanced Practice Providers (APPs -  Physician Assistants and Nurse Practitioners) who all work together to provide you with the care you need, when you need it.  We recommend signing up for the patient portal called "MyChart".  Sign up information is provided on this After Visit Summary.  MyChart is used to connect with patients for Virtual Visits (Telemedicine).  Patients are able to view lab/test results, encounter notes, upcoming appointments, etc.  Non-urgent messages can be sent to your provider as well.   To learn more about what you can do with MyChart, go to [email or web address].    Your next appointment:   12 month(s)  Provider:   Minus Breeding, MD

## 2022-07-10 LAB — BASIC METABOLIC PANEL
BUN/Creatinine Ratio: 16 (ref 10–24)
BUN: 17 mg/dL (ref 8–27)
CO2: 20 mmol/L (ref 20–29)
Calcium: 9.8 mg/dL (ref 8.6–10.2)
Chloride: 105 mmol/L (ref 96–106)
Creatinine, Ser: 1.04 mg/dL (ref 0.76–1.27)
Glucose: 93 mg/dL (ref 70–99)
Potassium: 4.4 mmol/L (ref 3.5–5.2)
Sodium: 142 mmol/L (ref 134–144)
eGFR: 73 mL/min/{1.73_m2} (ref >59)

## 2022-07-11 ENCOUNTER — Encounter: Payer: Self-pay | Admitting: *Deleted

## 2022-07-19 ENCOUNTER — Ambulatory Visit (HOSPITAL_COMMUNITY)
Admission: RE | Admit: 2022-07-19 | Discharge: 2022-07-19 | Disposition: A | Discharge status: Home / Self Care | Payer: Medicare Other | Source: Ambulatory Visit | Attending: Cardiovascular Disease | Admitting: Cardiovascular Disease

## 2022-07-19 DIAGNOSIS — I714 Abdominal aortic aneurysm, without rupture, unspecified: Secondary | ICD-10-CM | POA: Diagnosis not present

## 2022-07-19 DIAGNOSIS — I7143 Infrarenal abdominal aortic aneurysm, without rupture: Secondary | ICD-10-CM

## 2022-07-26 ENCOUNTER — Telehealth: Payer: Self-pay | Admitting: Cardiology

## 2022-07-26 NOTE — Telephone Encounter (Signed)
Patient wife aware of results. She would like results mailed so they can have a copy.

## 2022-07-26 NOTE — Telephone Encounter (Signed)
Pt was returning nurses call in regards to results. Please advise 

## 2022-07-29 ENCOUNTER — Encounter: Payer: Self-pay | Admitting: *Deleted

## 2022-11-08 ENCOUNTER — Telehealth: Payer: Self-pay | Admitting: Cardiology

## 2022-11-08 DIAGNOSIS — M7989 Other specified soft tissue disorders: Secondary | ICD-10-CM

## 2022-11-08 MED ORDER — POTASSIUM CHLORIDE CRYS ER 20 MEQ PO TBCR
20.0000 meq | EXTENDED_RELEASE_TABLET | Freq: Every day | ORAL | 2 refills | Status: DC
Start: 2022-11-08 — End: 2023-09-08

## 2022-11-08 NOTE — Telephone Encounter (Signed)
*  STAT* If patient is at the pharmacy, call can be transferred to refill team.   1. Which medications need to be refilled? (please list name of each medication and dose if known)   potassium chloride SA (KLOR-CON M) 20 MEQ tablet   2. Would you like to learn more about the convenience, safety, & potential cost savings by using the [facility]?   3. Are you open to using the [facility] (Type [facility]. )  4. Which pharmacy/location (including street and city if local pharmacy) is medication to be sent to?  [facility] [address] [place] - [address]    5. Do they need a 30 day or 90 day supply?   90 day  Wife stated patient is almost out of this medication.

## 2022-11-08 NOTE — Telephone Encounter (Signed)
Pt's medication was sent to pt's pharmacy as requested. Confirmation received.  °

## 2022-11-26 ENCOUNTER — Telehealth: Payer: Self-pay | Admitting: *Deleted

## 2022-11-26 DIAGNOSIS — R7303 Prediabetes: Secondary | ICD-10-CM

## 2022-11-26 DIAGNOSIS — E7800 Pure hypercholesterolemia, unspecified: Secondary | ICD-10-CM

## 2022-11-26 DIAGNOSIS — Z125 Encounter for screening for malignant neoplasm of prostate: Secondary | ICD-10-CM

## 2022-11-26 NOTE — Telephone Encounter (Signed)
-----   Message from Alvina Chou sent at 11/26/2022  3:46 PM EDT ----- Regarding: Lab orders for Monday . 8.19.24 Patient is scheduled for CPX labs, please order future labs, Thanks , Camelia Eng

## 2022-12-09 ENCOUNTER — Other Ambulatory Visit (INDEPENDENT_AMBULATORY_CARE_PROVIDER_SITE_OTHER): Payer: Medicare Other

## 2022-12-09 DIAGNOSIS — R7303 Prediabetes: Secondary | ICD-10-CM

## 2022-12-09 DIAGNOSIS — Z125 Encounter for screening for malignant neoplasm of prostate: Secondary | ICD-10-CM | POA: Diagnosis not present

## 2022-12-09 DIAGNOSIS — E7800 Pure hypercholesterolemia, unspecified: Secondary | ICD-10-CM | POA: Diagnosis not present

## 2022-12-09 LAB — LIPID PANEL
Cholesterol: 158 mg/dL (ref 0–200)
HDL: 40.9 mg/dL (ref >39.00)
NonHDL: 117.49
Total CHOL/HDL Ratio: 4
Triglycerides: 251 mg/dL — ABNORMAL HIGH (ref 0.0–149.0)
VLDL: 50.2 mg/dL — ABNORMAL HIGH (ref 0.0–40.0)

## 2022-12-09 LAB — COMPREHENSIVE METABOLIC PANEL
ALT: 26 U/L (ref 0–53)
AST: 21 U/L (ref 0–37)
Albumin: 4.5 g/dL (ref 3.5–5.2)
Alkaline Phosphatase: 46 U/L (ref 39–117)
BUN: 21 mg/dL (ref 6–23)
CO2: 25 mEq/L (ref 19–32)
Calcium: 10.2 mg/dL (ref 8.4–10.5)
Chloride: 106 mEq/L (ref 96–112)
Creatinine, Ser: 1.22 mg/dL (ref 0.40–1.50)
GFR: 56.61 mL/min — ABNORMAL LOW (ref >60.00)
Glucose, Bld: 115 mg/dL — ABNORMAL HIGH (ref 70–99)
Potassium: 4.3 mEq/L (ref 3.5–5.1)
Sodium: 144 mEq/L (ref 135–145)
Total Bilirubin: 0.5 mg/dL (ref 0.2–1.2)
Total Protein: 7 g/dL (ref 6.0–8.3)

## 2022-12-09 LAB — HEMOGLOBIN A1C: Hgb A1c MFr Bld: 6 % (ref 4.6–6.5)

## 2022-12-09 LAB — LDL CHOLESTEROL, DIRECT: Direct LDL: 85 mg/dL

## 2022-12-09 LAB — PSA, MEDICARE: PSA: 5.38 ng/ml — ABNORMAL HIGH (ref 0.10–4.00)

## 2022-12-10 NOTE — Progress Notes (Signed)
No critical labs need to be addressed urgently. We will discuss labs in detail at upcoming office visit.   

## 2022-12-12 ENCOUNTER — Other Ambulatory Visit: Payer: Medicare Other

## 2022-12-13 ENCOUNTER — Ambulatory Visit (INDEPENDENT_AMBULATORY_CARE_PROVIDER_SITE_OTHER): Payer: Medicare Other | Admitting: Family Medicine

## 2022-12-13 ENCOUNTER — Encounter: Payer: Self-pay | Admitting: Family Medicine

## 2022-12-13 VITALS — BP 120/74 | HR 81 | Temp 98.4°F | Ht 69.0 in | Wt 270.2 lb

## 2022-12-13 DIAGNOSIS — R7303 Prediabetes: Secondary | ICD-10-CM

## 2022-12-13 DIAGNOSIS — G4733 Obstructive sleep apnea (adult) (pediatric): Secondary | ICD-10-CM | POA: Diagnosis not present

## 2022-12-13 DIAGNOSIS — I10 Essential (primary) hypertension: Secondary | ICD-10-CM

## 2022-12-13 DIAGNOSIS — M1A9XX0 Chronic gout, unspecified, without tophus (tophi): Secondary | ICD-10-CM | POA: Diagnosis not present

## 2022-12-13 DIAGNOSIS — Z0000 Encounter for general adult medical examination without abnormal findings: Secondary | ICD-10-CM | POA: Diagnosis not present

## 2022-12-13 DIAGNOSIS — E7800 Pure hypercholesterolemia, unspecified: Secondary | ICD-10-CM

## 2022-12-13 DIAGNOSIS — G1223 Primary lateral sclerosis: Secondary | ICD-10-CM | POA: Diagnosis not present

## 2022-12-13 DIAGNOSIS — R972 Elevated prostate specific antigen [PSA]: Secondary | ICD-10-CM

## 2022-12-13 DIAGNOSIS — I714 Abdominal aortic aneurysm, without rupture, unspecified: Secondary | ICD-10-CM

## 2022-12-13 MED ORDER — BACLOFEN 10 MG PO TABS
10.0000 mg | ORAL_TABLET | Freq: Three times a day (TID) | ORAL | 3 refills | Status: AC
Start: 2022-12-13 — End: ?

## 2022-12-13 NOTE — Progress Notes (Signed)
Patient ID: Edward Frank, male    DOB: 01-Dec-1945, 77 y.o.   MRN: [id number]  This visit was conducted in person.  BP 120/74 (BP Location: Left Arm, Patient Position: Sitting, Cuff Size: Large)   Pulse 81   Temp 98.4 F (36.9 C) (Temporal)   Ht 5\' 9"  (1.753 m)   Wt 270 lb 4 oz (122.6 kg)   SpO2 97%   BMI 39.91 kg/m    CC:  Chief Complaint  Patient presents with   Medicare Wellness    Subjective:   HPI: Edward Frank is a 77 y.o. male presenting on 12/13/2022 for Medicare Wellness The patient presents for annual medicare wellness, complete physical and review of chronic health problems. He/She also has the following acute concerns today: none  I have personally reviewed the Medicare Annual Wellness questionnaire and have noted 1. [name] medical and social history 2. Their use of alcohol, tobacco or illicit drugs 3. Their current medications and supplements 4. [name] functional ability including ADL's, fall risks, home safety risks and hearing or visual             impairment. 5. Diet and physical activities 6. Evidence for depression or mood disorders 7.         Updated provider list Cognitive evaluation was performed and recorded on pt medicare questionnaire form. The patients weight, height, BMI and visual acuity have been recorded in the chart   I have made referrals, counseling and provided education to the patient based review of the above and I have provided the pt with a written personalized care plan for preventive services. .  Documentation of this information was scanned into the electronic record under the media tab.   Advance directives and end of life planning reviewed in detail with patient and documented in EMR. Patient given handout on advance care directives if needed. HCPOA and living will updated if needed.   4 falls in last 12 months.  Hearing Screening  Method: Audiometry   500Hz  1000Hz  2000Hz  4000Hz   Right ear 25 40 20 40   Left ear 20 40 20 0  Vision Screening - Comments:: Eye Exam done at [facility] in [facility] 2023  Flowsheet Row Office Visit from 12/13/2022 in [facility] Total Score 0        PLS/upper motor dominant AS: Continue slow progression change.  Was followed at [facility]. Now followed by Dr. Dierdre Searles   at [facility]. He has walker and cane.  less endurance, weaker over all. 12/03/2022 OV Dr. Macario Carls reviewed re: oropharyngeal dysphagia.[name] referred to speech therapy Currently doing outpatient rehab for muscle weakness.  Hypertension:  Well controlled on losartan 50 mg p.o. daily BP Readings from Last 3 Encounters:  12/13/22 120/74  07/09/22 126/80  01/07/22 130/88  Using medication without problems or lightheadedness:  none Chest pain with exertion: none Edema:resolved Short of breath: stable Average home BPs: Other issues:  Elevated Cholesterol: LDL at goal on pravastatin 20 mg p.o. daily Lab Results  Component Value Date   CHOL 158 12/09/2022   HDL 40.90 12/09/2022   LDLCALC 85 12/03/2018   LDLDIRECT 85.0 12/09/2022   TRIG 251.0 (H) 12/09/2022   CHOLHDL 4 12/09/2022  Using medications without problems: none Muscle aches:  none Diet compliance: moderate Exercise: limited Other complaints:  Prediabetes  Lab Results  Component Value Date   HGBA1C 6.0 12/09/2022    Wt Readings from Last 3 Encounters:  12/13/22  270 lb 4 oz (122.6 kg)  07/09/22 278 lb (126.1 kg)  01/07/22 277 lb (125.6 kg)     AAA:  3.6 cm on CT in Feb of 2019.  Recent ultrasound AAA duplex showed no significant change in size Reviewed Cardiology note 06/2022 Dr. Antoine Poche Stable pedal edema and DOE.  On demedex 40 mg daily, with potassium.[name]  ECHO: 09/2021 EF 60-65%  Gout on allopurinol for prevention. No flares.   Temporal arteritis ?[name]. no biopsy... prednisone did not help,,, indocin controlled.. started by Dr. Hyacinth Meeker 20 years.  Relevant past medical, surgical, family and social  history reviewed and updated as indicated. Interim medical history since our last visit reviewed. Allergies and medications reviewed and updated. Outpatient Medications Prior to Visit  Medication Sig Dispense Refill   Albuterol Sulfate (PROAIR RESPICLICK) 108 (90 Base) MCG/ACT AEPB Inhale 1-2 puffs into the lungs every 4 (four) hours as needed.     allopurinol (ZYLOPRIM) 100 MG tablet Take 100 mg by mouth daily.     ascorbic acid (VITAMIN C) 1000 MG tablet Take 1,000 mg by mouth daily.     aspirin EC 81 MG tablet Take 81 mg by mouth daily.     azelastine (ASTELIN) 0.1 % nasal spray Place 2 sprays into both nostrils 2 (two) times daily. Use in each nostril as directed 30 mL 12   Cholecalciferol 125 MCG (5000 UT) capsule Take by mouth.     Coenzyme Q10 (COQ10) 400 MG CAPS Take 400 mg by mouth daily.     Cranberry 500 MG CAPS Take 500 mg by mouth 2 (two) times daily.      Cyanocobalamin (VITAMIN B-12) 2500 MCG SUBL Place 2,500 mcg under the tongue daily.     desonide (DESOWEN) 0.05 % cream Apply 1 application topically 2 (two) times daily as needed (irritation).      hydrocortisone (ANUSOL-HC) 2.5 % rectal cream Place 1 application rectally 2 (two) times daily as needed for hemorrhoids or anal itching.     indomethacin (INDOCIN) 25 MG capsule Take 25 mg by mouth daily.      losartan (COZAAR) 50 MG tablet Take 1 tablet by mouth daily.     methocarbamol (ROBAXIN) 750 MG tablet Take 750 mg by mouth 2 (two) times daily.      mometasone-formoterol (DULERA) 100-5 MCG/ACT AERO Inhale 2 puffs into the lungs 2 (two) times daily.     Multiple Vitamin (MULTIVITAMIN WITH MINERALS) TABS Take 1 tablet by mouth daily.     Omega-3 Fatty Acids (FISH OIL TRIPLE STRENGTH) 1400 MG CAPS Take 2,800 mg by mouth 2 (two) times daily.      omeprazole (PRILOSEC) 20 MG capsule Take 40 mg by mouth daily.      OVER THE COUNTER MEDICATION Take 1 tablet by mouth daily. Calcium citrate+D3 500-800 mg     potassium chloride SA  (KLOR-CON M) 20 MEQ tablet Take 1 tablet (20 mEq total) by mouth daily. 90 tablet 2   pravastatin (PRAVACHOL) 40 MG tablet Take 1 tablet (40 mg total) by mouth daily. 90 tablet 3   RESVERATROL 100 MG CAPS Take 100 mg by mouth daily.      torsemide (DEMADEX) 20 MG tablet Take 10 mg by mouth daily.     Turmeric (QC TUMERIC COMPLEX PO) Take by mouth.     zinc gluconate 50 MG tablet Take 50 mg by mouth daily.     Baclofen 5 MG TABS Take 15 mg by mouth in the morning and at  bedtime.     chlorpheniramine-HYDROcodone (TUSSIONEX) 10-8 MG/5ML Take 5 mLs by mouth every 12 (twelve) hours as needed for cough. 100 mL 0   torsemide (DEMADEX) 20 MG tablet Take 2 tablets every morning and 2 tablets 6 hours later (Patient taking differently: Take 10 mg by mouth once. Take 2 tablets every morning and 2 tablets 6 hours later) 360 tablet 3   No facility-administered medications prior to visit.     Per HPI unless specifically indicated in ROS section below Review of Systems  Constitutional:  Negative for fatigue and fever.  HENT:  Negative for ear pain.   Eyes:  Negative for pain.  Respiratory:  Negative for cough and shortness of breath.   Cardiovascular:  Negative for chest pain, palpitations and leg swelling.  Gastrointestinal:  Negative for abdominal pain.  Genitourinary:  Negative for dysuria.  Musculoskeletal:  Negative for arthralgias.  Neurological:  Negative for syncope, light-headedness and headaches.  Psychiatric/Behavioral:  Negative for dysphoric mood.    Objective:  BP 120/74 (BP Location: Left Arm, Patient Position: Sitting, Cuff Size: Large)   Pulse 81   Temp 98.4 F (36.9 C) (Temporal)   Ht 5\' 9"  (1.753 m)   Wt 270 lb 4 oz (122.6 kg)   SpO2 97%   BMI 39.91 kg/m   Wt Readings from Last 3 Encounters:  12/13/22 270 lb 4 oz (122.6 kg)  07/09/22 278 lb (126.1 kg)  01/07/22 277 lb (125.6 kg)      Physical Exam Exam conducted with a chaperone present.  Constitutional:      General:  He is not in acute distress.    Appearance: Normal appearance. He is well-developed. He is not ill-appearing or toxic-appearing.  HENT:     Head: Normocephalic and atraumatic.     Right Ear: Hearing, tympanic membrane, ear canal and external ear normal.     Left Ear: Hearing, tympanic membrane, ear canal and external ear normal.     Nose: Nose normal.     Mouth/Throat:     Pharynx: Uvula midline.  Eyes:     General: Lids are normal. Lids are everted, no foreign bodies appreciated.     Conjunctiva/sclera: Conjunctivae normal.     Pupils: Pupils are equal, round, and reactive to light.  Neck:     Thyroid: No thyroid mass or thyromegaly.     Vascular: No carotid bruit.     Trachea: Trachea and phonation normal.  Cardiovascular:     Rate and Rhythm: Normal rate and regular rhythm.     Pulses: Normal pulses.     Heart sounds: S1 normal and S2 normal. No murmur heard.    No gallop.  Pulmonary:     Breath sounds: Normal breath sounds. No wheezing, rhonchi or rales.  Abdominal:     General: Bowel sounds are normal.     Palpations: Abdomen is soft.     Tenderness: There is no abdominal tenderness. There is no guarding or rebound.     Hernia: No hernia is present.  Genitourinary:    Prostate: Enlarged. Not tender and no nodules present.     Rectum: Normal.     Comments: Redness on buttocks from sitting., no decubitus. Musculoskeletal:     Cervical back: Normal range of motion and neck supple.  Lymphadenopathy:     Cervical: No cervical adenopathy.  Skin:    General: Skin is warm and dry.     Findings: No rash.  Neurological:     Mental Status:  He is alert and oriented to person, place, and time.     Cranial Nerves: No cranial nerve deficit.     Sensory: No sensory deficit.     Motor: Weakness and abnormal muscle tone present.     Coordination: Coordination abnormal. Finger-Nose-Finger Test abnormal and Heel to [facility] Test abnormal.     Gait: Gait abnormal and tandem walk abnormal.      Deep Tendon Reflexes: Reflexes are normal and symmetric.  Psychiatric:        Speech: Speech normal.        Behavior: Behavior normal.        Judgment: Judgment normal.       Results for orders placed or performed in visit on 12/09/22  Hemoglobin A1c  Result Value Ref Range   Hgb A1c MFr Bld 6.0 4.6 - 6.5 %  Lipid panel  Result Value Ref Range   Cholesterol 158 0 - 200 mg/dL   Triglycerides 130.8 (H) 0.0 - 149.0 mg/dL   HDL 65.78 >46.96 mg/dL   VLDL 29.5 (H) 0.0 - 28.4 mg/dL   Total CHOL/HDL Ratio 4    NonHDL 117.49   PSA, Medicare  Result Value Ref Range   PSA 5.38 (H) 0.10 - 4.00 ng/ml  Comprehensive metabolic panel  Result Value Ref Range   Sodium 144 135 - 145 mEq/L   Potassium 4.3 3.5 - 5.1 mEq/L   Chloride 106 96 - 112 mEq/L   CO2 25 19 - 32 mEq/L   Glucose, Bld 115 (H) 70 - 99 mg/dL   BUN 21 6 - 23 mg/dL   Creatinine, Ser 1.32 0.40 - 1.50 mg/dL   Total Bilirubin 0.5 0.2 - 1.2 mg/dL   Alkaline Phosphatase 46 39 - 117 U/L   AST 21 0 - 37 U/L   ALT 26 0 - 53 U/L   Total Protein 7.0 6.0 - 8.3 g/dL   Albumin 4.5 3.5 - 5.2 g/dL   GFR 44.01 (L) >02.72 mL/min   Calcium 10.2 8.4 - 10.5 mg/dL  LDL cholesterol, direct  Result Value Ref Range   Direct LDL 85.0 mg/dL     COVID 19 screen:  No recent travel or known exposure to COVID19 The patient denies respiratory symptoms of COVID 19 at this time. The importance of social distancing was discussed today.   Assessment and Plan [name] preventative maintenance and recommended screening tests for an annual wellness exam were reviewed in full today. Brought up to date unless services declined.  Counselled on the importance of diet, exercise, and its role in overall health and mortality. [name] FH and SH was reviewed, including their home life, tobacco status, and drug and alcohol status.   Vaccines:  Uptodate pneumovax/prevnar and  shingles x 2  ,tdap, COVID x 2 Prostate: Father with prostate cancer  age 57s.   Discussed in detail, have chose to check PSA and rectal exam yearly.  PSA elevated this year. Lab Results  Component Value Date   PSA 5.38 (H) 12/09/2022   PSA 2.06 12/04/2021   PSA 2.07 12/01/2020  Colon: [facility] with colon [facility], 07/2019, no further needed given age. nonsmoker Hep C screening: done   Problem List Items Addressed This Visit     AAA (abdominal aortic aneurysm) without rupture (HCC)    Stable from 2019-2024, per cardiology.  Dr. Antoine Poche.      Relevant Medications   torsemide (DEMADEX) 20 MG tablet   Chronic gout without tophus  Chronic, no flares on allopurinol 100 mg p.o. daily      HYPERCHOLESTEROLEMIA    Stable, chronic.  Continue current medication.   Pravastatin 20 mg daily      Relevant Medications   torsemide (DEMADEX) 20 MG tablet   HYPERTENSION, MILD    Stable, chronic.  Continue current medication.   Losartan 50 mg p.o. daily      Relevant Medications   torsemide (DEMADEX) 20 MG tablet   OSA (obstructive sleep apnea)    He has history of sleep apnea.  He was not able to tolerate CPAP, and is not willing to try CPAP again.  He attempted to get fitted for oral appliance, but his dentist could not find medicare billing number.  As a result he never got an oral appliance.        Prediabetes    Chronic, stable control. Encouraged exercise, weight loss, healthy eating habits.       Primary lateral sclerosis (HCC)    ALS variant, rare   Progressive, significantly limiting ambulation and balance.  Using cane and sometimes scooter.  Followed at [facility]      Other Visit Diagnoses     Medicare annual wellness visit, subsequent    -  Primary   Elevated PSA       Relevant Orders   PSA, total and free         [name], MD

## 2022-12-13 NOTE — Patient Instructions (Addendum)
Titrate up baclofen as directed by Dr. Dierdre Searles. Try to wean of robaxin as able.  Please stop at the lab to have labs drawn.

## 2022-12-13 NOTE — Assessment & Plan Note (Signed)
Stable, chronic.  Continue current medication.  Pravastatin 20 mg daily

## 2022-12-13 NOTE — Assessment & Plan Note (Signed)
He has history of sleep apnea.  He was not able to tolerate CPAP, and is not willing to try CPAP again.  He attempted to get fitted for oral appliance, but his dentist could not find medicare billing number.  As a result he never got an oral appliance.

## 2022-12-13 NOTE — Assessment & Plan Note (Addendum)
Stable from 2019-2024, per cardiology.  Dr. Antoine Poche.

## 2022-12-13 NOTE — Assessment & Plan Note (Signed)
Chronic, no flares on allopurinol 100 mg p.o. daily

## 2022-12-13 NOTE — Assessment & Plan Note (Signed)
Chronic, stable control  Encouraged exercise, weight loss, healthy eating habits.

## 2022-12-13 NOTE — Assessment & Plan Note (Signed)
Stable, chronic.  Continue current medication.  Losartan 50 mg p.o. daily

## 2022-12-13 NOTE — Assessment & Plan Note (Signed)
ALS variant, rare   Progressive, significantly limiting ambulation and balance.  Using cane and sometimes scooter.  Followed at [facility]

## 2022-12-16 LAB — PSA, TOTAL AND FREE
PSA, % Free: 36 % (ref >25)
PSA, Free: 1.4 ng/mL
PSA, Total: 3.9 ng/mL (ref <=4.0)

## 2022-12-17 ENCOUNTER — Other Ambulatory Visit: Payer: Self-pay | Admitting: Family Medicine

## 2022-12-17 DIAGNOSIS — R972 Elevated prostate specific antigen [PSA]: Secondary | ICD-10-CM

## 2022-12-18 ENCOUNTER — Encounter: Payer: Self-pay | Admitting: *Deleted

## 2022-12-19 ENCOUNTER — Encounter: Payer: Medicare Other | Admitting: Family Medicine

## 2023-01-21 ENCOUNTER — Ambulatory Visit (INDEPENDENT_AMBULATORY_CARE_PROVIDER_SITE_OTHER): Payer: Medicare Other | Admitting: Family Medicine

## 2023-01-21 ENCOUNTER — Encounter: Payer: Self-pay | Admitting: Family Medicine

## 2023-01-21 DIAGNOSIS — R058 Other specified cough: Secondary | ICD-10-CM | POA: Diagnosis not present

## 2023-01-21 MED ORDER — DOXYCYCLINE HYCLATE 100 MG PO TABS: 100.0000 mg | ORAL_TABLET | Freq: Two times a day (BID) | ORAL | 0 refills | Status: DC

## 2023-01-21 MED ORDER — HYDROCOD POLI-CHLORPHE POLI ER 10-8 MG/5ML PO SUER
5.0000 mL | Freq: Two times a day (BID) | ORAL | 0 refills | Status: DC | PRN
Start: 2023-01-21 — End: 2023-01-28

## 2023-01-21 MED ORDER — PREDNISONE 20 MG PO TABS: ORAL_TABLET | ORAL | 0 refills | Status: DC

## 2023-01-21 NOTE — Progress Notes (Unsigned)
H/o ALS noted. D/w pt.    Sx started last night.  Coughed most of the night.  No one else is sick.  No sputum.  He typically doesn't have dysphagia. No fevers.  No vomiting.  No diarrhea.  Some constipation at baseline.  Some occ wheeze.  Still on dulera and prn SABA.  No known covid exposure.  Minimal exposure.  Prev ST and frontal sinus pain.    H/o similar in the past.   Flu shot done last week at [place].  Weight is stable.    Meds, vitals, and allergies reviewed.   ROS: Per HPI unless specifically indicated in ROS section   1+ BLE edema, L>R

## 2023-01-21 NOTE — Patient Instructions (Signed)
Prednisone with food, start doxy. Update [place] as needed.  Sedation caution on cough syrup.  Take care.  Glad to see you.

## 2023-01-22 NOTE — Assessment & Plan Note (Signed)
Given his minimal exposure to others and his baseline history of ALS, I think it makes sense to go ahead and treat him. Routed to PCP as FYI. Prednisone with food, start doxy.  Steroid cautions discussed with patient.  Update [place] as needed.  Sedation caution on Hycodan.  No focal decrease in breath sounds and okay for outpatient follow-up.

## 2023-01-23 ENCOUNTER — Telehealth: Payer: Self-pay | Admitting: Family Medicine

## 2023-01-23 NOTE — Telephone Encounter (Signed)
Patient's wife called with an update on how patient is feeling, he was seen for cough by Para March on 10/1. Wife states that medication is slowly helping with frequency and intensity of cough. Says pt is feeling a little better.

## 2023-01-23 NOTE — Telephone Encounter (Signed)
Glad to hear it. 

## 2023-01-28 ENCOUNTER — Ambulatory Visit (INDEPENDENT_AMBULATORY_CARE_PROVIDER_SITE_OTHER): Payer: Medicare Other | Admitting: Family Medicine

## 2023-01-28 ENCOUNTER — Ambulatory Visit (INDEPENDENT_AMBULATORY_CARE_PROVIDER_SITE_OTHER)
Admission: RE | Admit: 2023-01-28 | Discharge: 2023-01-28 | Disposition: A | Discharge status: Home / Self Care | Payer: Medicare Other | Source: Ambulatory Visit | Attending: Family Medicine | Admitting: Family Medicine

## 2023-01-28 VITALS — BP 120/68 | HR 85 | Temp 98.6°F | Ht 69.0 in | Wt 271.0 lb

## 2023-01-28 DIAGNOSIS — Z96612 Presence of left artificial shoulder joint: Secondary | ICD-10-CM | POA: Diagnosis not present

## 2023-01-28 DIAGNOSIS — R058 Other specified cough: Secondary | ICD-10-CM

## 2023-01-28 DIAGNOSIS — R059 Cough, unspecified: Secondary | ICD-10-CM | POA: Diagnosis not present

## 2023-01-28 DIAGNOSIS — R051 Acute cough: Secondary | ICD-10-CM

## 2023-01-28 DIAGNOSIS — R918 Other nonspecific abnormal finding of lung field: Secondary | ICD-10-CM | POA: Diagnosis not present

## 2023-01-28 DIAGNOSIS — J984 Other disorders of lung: Secondary | ICD-10-CM

## 2023-01-28 MED ORDER — AMOXICILLIN-POT CLAVULANATE 875-125 MG PO TABS: 1.0000 | ORAL_TABLET | Freq: Two times a day (BID) | ORAL | 0 refills | Status: DC

## 2023-01-28 MED ORDER — HYDROCOD POLI-CHLORPHE POLI ER 10-8 MG/5ML PO SUER
5.0000 mL | Freq: Two times a day (BID) | ORAL | 0 refills | Status: DC | PRN
Start: 2023-01-28 — End: 2023-12-16

## 2023-01-28 NOTE — Assessment & Plan Note (Signed)
Chronic, followed by pulmonary at [facility]  On dulera BID and albuterol prn.

## 2023-01-28 NOTE — Progress Notes (Signed)
Patient ID: Edward Frank, male    DOB: 1945/08/23, 77 y.o.   MRN: [id number]  This visit was conducted in person.  BP 120/68   Pulse 85   Temp 98.6 F (37 C) (Oral)   Ht 5\' 9"  (1.753 m)   Wt 271 lb (122.9 kg)   SpO2 95%   BMI 40.02 kg/m    CC:  Chief Complaint  Patient presents with   Cough    Subjective:   HPI: Edward Frank is a 77 y.o. male presenting on 01/28/2023 for Cough  Reviewed office visit from Dr. Para March January 21, 2023 patient seen for 24 hours of cough. Treated with prednisone taper 60 mg, 6 days and doxycycline 100 mg p.o. twice daily. Today he is on day 7 of antibiotics.  He reports he is continuing to cough, mainly dry sometime productive.  He is  feeling SOB after coughing fits. Wheeze.  No fever.  Some worsening of swelling in feet given having to sleep sitting up.. improving.  Wife was negative for COVID, flu, RSV.  History of ALS type illness  Small airway disease  ( per Pulmonary at [facility] and as needed bronchodilator about twice daily.     Wt Readings from Last 3 Encounters:  01/28/23 271 lb (122.9 kg)  01/21/23 265 lb (120.2 kg)  12/13/22 270 lb 4 oz (122.6 kg)     Relevant past medical, surgical, family and social history reviewed and updated as indicated. Interim medical history since our last visit reviewed. Allergies and medications reviewed and updated. Outpatient Medications Prior to Visit  Medication Sig Dispense Refill   Albuterol Sulfate (PROAIR RESPICLICK) 108 (90 Base) MCG/ACT AEPB Inhale 1-2 puffs into the lungs every 4 (four) hours as needed.     allopurinol (ZYLOPRIM) 100 MG tablet Take 100 mg by mouth daily.     ascorbic acid (VITAMIN C) 1000 MG tablet Take 1,000 mg by mouth daily.     aspirin EC 81 MG tablet Take 81 mg by mouth daily.     azelastine (ASTELIN) 0.1 % nasal spray Place 2 sprays into both nostrils 2 (two) times daily. Use in each nostril as directed 30 mL 12   baclofen (LIORESAL) 10 MG tablet  Take 1-2 tablets (10-20 mg total) by mouth 3 (three) times daily. 180 each 3   Cholecalciferol 125 MCG (5000 UT) capsule Take by mouth.     Coenzyme Q10 (COQ10) 400 MG CAPS Take 400 mg by mouth daily.     Cranberry 500 MG CAPS Take 500 mg by mouth 2 (two) times daily.      Cyanocobalamin (VITAMIN B-12) 2500 MCG SUBL Place 2,500 mcg under the tongue daily.     desonide (DESOWEN) 0.05 % cream Apply 1 application topically 2 (two) times daily as needed (irritation).      hydrocortisone (ANUSOL-HC) 2.5 % rectal cream Place 1 application rectally 2 (two) times daily as needed for hemorrhoids or anal itching.     indomethacin (INDOCIN) 25 MG capsule Take 25 mg by mouth daily.      losartan (COZAAR) 50 MG tablet Take 1 tablet by mouth daily.     methocarbamol (ROBAXIN) 750 MG tablet Take 750 mg by mouth 2 (two) times daily.      mometasone-formoterol (DULERA) 100-5 MCG/ACT AERO Inhale 2 puffs into the lungs 2 (two) times daily.     Multiple Vitamin (MULTIVITAMIN WITH MINERALS) TABS Take 1 tablet by mouth daily.     Omega-3  Fatty Acids (FISH OIL TRIPLE STRENGTH) 1400 MG CAPS Take 2,800 mg by mouth 2 (two) times daily.      OVER THE COUNTER MEDICATION Take 1 tablet by mouth daily. Calcium citrate+D3 500-800 mg     potassium chloride SA (KLOR-CON M) 20 MEQ tablet Take 1 tablet (20 mEq total) by mouth daily. 90 tablet 2   pravastatin (PRAVACHOL) 40 MG tablet Take 1 tablet (40 mg total) by mouth daily. 90 tablet 3   predniSONE (DELTASONE) 20 MG tablet 3 tabs by mouth daily x 3 days, then 2 tabs by mouth daily x 2 days then 1 tab by mouth daily x 2 days 15 tablet 0   RESVERATROL 100 MG CAPS Take 100 mg by mouth daily.      torsemide (DEMADEX) 20 MG tablet Take 10 mg by mouth daily.     Turmeric (QC TUMERIC COMPLEX PO) Take by mouth.     zinc gluconate 50 MG tablet Take 50 mg by mouth daily.     chlorpheniramine-HYDROcodone (TUSSIONEX) 10-8 MG/5ML Take 5 mLs by mouth every 12 (twelve) hours as needed for  cough. 100 mL 0   doxycycline (VIBRA-TABS) 100 MG tablet Take 1 tablet (100 mg total) by mouth 2 (two) times daily. 14 tablet 0   No facility-administered medications prior to visit.     Per HPI unless specifically indicated in ROS section below Review of Systems  Constitutional:  Positive for fatigue. Negative for fever.  HENT:  Negative for ear pain.   Eyes:  Negative for pain.  Respiratory:  Positive for cough and shortness of breath.   Cardiovascular:  Negative for chest pain, palpitations and leg swelling.  Gastrointestinal:  Negative for abdominal pain.  Genitourinary:  Negative for dysuria.  Musculoskeletal:  Negative for arthralgias.  Neurological:  Negative for syncope, light-headedness and headaches.  Psychiatric/Behavioral:  Negative for dysphoric mood.    Objective:  BP 120/68   Pulse 85   Temp 98.6 F (37 C) (Oral)   Ht 5\' 9"  (1.753 m)   Wt 271 lb (122.9 kg)   SpO2 95%   BMI 40.02 kg/m   Wt Readings from Last 3 Encounters:  01/28/23 271 lb (122.9 kg)  01/21/23 265 lb (120.2 kg)  12/13/22 270 lb 4 oz (122.6 kg)      Physical Exam Constitutional:      Appearance: He is well-developed.     Comments: Constant dry coughing fits  HENT:     Head: Normocephalic.     Right Ear: Hearing normal.     Left Ear: Hearing normal.     Nose: Nose normal.  Neck:     Thyroid: No thyroid mass or thyromegaly.     Vascular: No carotid bruit.     Trachea: Trachea normal.  Cardiovascular:     Rate and Rhythm: Normal rate and regular rhythm.     Pulses: Normal pulses.     Heart sounds: Heart sounds not distant. No murmur heard.    No friction rub. No gallop.     Comments: No peripheral edema Pulmonary:     Effort: Pulmonary effort is normal. No respiratory distress.     Breath sounds: Normal breath sounds.  Skin:    General: Skin is warm and dry.     Findings: No rash.  Psychiatric:        Speech: Speech normal.        Behavior: Behavior normal.        Thought  Content: Thought content  normal.       Results for orders placed or performed in visit on 12/13/22  PSA, total and free  Result Value Ref Range   PSA, Total 3.9 < OR = 4.0 ng/mL   PSA, Free 1.4 ng/mL   PSA, % Free 36 >25 % (calc)    Assessment and Plan  Small airways disease Assessment & Plan:  Chronic, followed by pulmonary at [facility]  On dulera BID and albuterol prn.  Orders: -     DG Chest 2 View; Future  Acute cough Assessment & Plan: Acute,  minimal improvement after 7 days doxycycline.  Eval with CXR for fluid overload vs PNA vs bronchitis.  Will broaden coverage to Augmentin x 10 days.  Complete prednisone taper.  Orders: -     DG Chest 2 View; Future  Other cough Assessment & Plan: Acute,  minimal improvement after 7 days doxycycline.  Eval with CXR for fluid overload vs PNA vs bronchitis.  Will broaden coverage to Augmentin x 10 days.  Complete prednisone taper.  Orders: -     Hydrocod Poli-Chlorphe Poli ER; Take 5 mLs by mouth every 12 (twelve) hours as needed for cough.  Dispense: 100 mL; Refill: 0  Other orders -     Amoxicillin-Pot Clavulanate; Take 1 tablet by mouth 2 (two) times daily.  Dispense: 20 tablet; Refill: 0    No follow-ups on file.   [name], MD

## 2023-01-28 NOTE — Assessment & Plan Note (Signed)
Acute,  minimal improvement after 7 days doxycycline.  Eval with CXR for fluid overload vs PNA vs bronchitis.  Will broaden coverage to Augmentin x 10 days.  Complete prednisone taper.

## 2023-01-30 ENCOUNTER — Telehealth: Payer: Self-pay | Admitting: Family Medicine

## 2023-01-30 NOTE — Telephone Encounter (Signed)
Pt's wife, Gavin Pound, called asking if our office could mail the pt a copy of his most recent xray results? Call back # [phone]

## 2023-01-30 NOTE — Telephone Encounter (Signed)
Called and let them know sending in mail today.

## 2023-05-01 ENCOUNTER — Ambulatory Visit: Payer: Medicare Other | Admitting: Family Medicine

## 2023-06-03 ENCOUNTER — Other Ambulatory Visit (INDEPENDENT_AMBULATORY_CARE_PROVIDER_SITE_OTHER): Payer: Medicare Other

## 2023-06-03 DIAGNOSIS — R972 Elevated prostate specific antigen [PSA]: Secondary | ICD-10-CM

## 2023-06-04 LAB — PSA, TOTAL WITH REFLEX TO PSA, FREE: PSA, Total: 2.9 ng/mL (ref <=4.0)

## 2023-06-05 ENCOUNTER — Encounter: Payer: Self-pay | Admitting: *Deleted

## 2023-06-16 ENCOUNTER — Other Ambulatory Visit: Payer: Medicare Other

## 2023-07-17 DIAGNOSIS — I7121 Aneurysm of the ascending aorta, without rupture: Secondary | ICD-10-CM | POA: Insufficient documentation

## 2023-07-17 NOTE — Progress Notes (Unsigned)
  Cardiology Office Note:   Date:  07/18/2023  ID:  Edward Frank, DOB [birth year], MRN [id number] PCP: Excell Seltzer, MD  [facility] Providers Cardiologist:  Rollene Rotunda, MD {  History of Present Illness:   Edward Frank is a 78 y.o. male who presents for follow up of syncope.  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  He has had chronic leg edema and some dyspnea with activity.  He has ALS and gets around slowly and mostly in a motorized scooter.   After calling to discuss syncope I applied a monitor that demonstrated NSVT although he did not have symptoms with this.  He had a normal EF on echo in June 2020.   Echo in June 2023 demonstrated NL LV and RV function.     He is managed at [facility] for ALS.   He says it has been slowly progressive.  He still goes out deer hunting and actually shot it does last year.  He gets around with his walker and a cane.  He has chronic lower extremity swelling.  This is not different than previous..  He has a scoliosis.  He still has his chronic cough.  He sleeps chronically in a chair.  ROS: As stated in the HPI and negative for all other systems.  Studies Reviewed:    EKG:   EKG Interpretation Date/Time:  Friday July 18 2023 08:57:17 EDT Ventricular Rate:  63 PR Interval:  254 QRS Duration:  114 QT Interval:  450 QTC Calculation: 460 R Axis:   250  Text Interpretation: Sinus rhythm with 1st degree A-V block with Premature atrial complexes Low voltage QRS Right bundle branch block No significant change since last tracing Confirmed by Rollene Rotunda ([id number]) on 07/18/2023 9:08:07 AM     Risk Assessment/Calculations:              Physical Exam:   VS:  BP 126/79 (BP Location: Left Arm, Patient Position: Sitting)   Pulse 79   Ht 5\' 10"  (1.778 m)   Wt 275 lb (124.7 kg)   SpO2 95%   BMI 39.46 kg/m    Wt Readings from Last 3 Encounters:  07/18/23 275 lb (124.7 kg)  01/28/23 271  lb (122.9 kg)  01/21/23 265 lb (120.2 kg)     GEN: Well nourished, well developed in no acute distress NECK: No JVD; No carotid bruits CARDIAC: RRR, no murmurs, rubs, gallops RESPIRATORY:  Clear to auscultation without rales, wheezing or rhonchi  ABDOMEN: Soft, non-tender, non-distended EXTREMITIES: Moderate bilateral lower extremity edema; No deformity   ASSESSMENT AND PLAN:   EDEMA: He is going to continue current dose of Demadex.  He says he is up-to-date with blood work.  No change in therapy. [name]  HTN:  The blood pressure is at target. No change in medications is indicated. We will continue with therapeutic lifestyle changes (TLC).    Asc AA:  This was 41 mm on echo in June 2023.  No further imaging at this time.   COUGH:   This seems to be baseline.  Continue the meds as listed.  Current medicines are reviewed at length with the patient today.  The patient does not have concerns regarding medicines.     Follow up with me in one year.   Signed, Rollene Rotunda, MD

## 2023-07-18 ENCOUNTER — Encounter: Payer: Self-pay | Admitting: Cardiology

## 2023-07-18 ENCOUNTER — Ambulatory Visit: Payer: Medicare Other | Attending: Cardiology | Admitting: Cardiology

## 2023-07-18 VITALS — BP 126/79 | HR 79 | Ht 70.0 in | Wt 275.0 lb

## 2023-07-18 DIAGNOSIS — M7989 Other specified soft tissue disorders: Secondary | ICD-10-CM | POA: Diagnosis not present

## 2023-07-18 DIAGNOSIS — R059 Cough, unspecified: Secondary | ICD-10-CM | POA: Diagnosis not present

## 2023-07-18 DIAGNOSIS — I7121 Aneurysm of the ascending aorta, without rupture: Secondary | ICD-10-CM

## 2023-07-18 NOTE — Patient Instructions (Signed)
 Medication Instructions:  No changes.  *If you need a refill on your cardiac medications before your next appointment, please call your pharmacy*   Follow-Up: At [facility], you and your health needs are our priority.  As part of our continuing mission to provide you with exceptional heart care, our providers are all part of one team.  This team includes your primary Cardiologist (physician) and Advanced Practice Providers or APPs (Physician Assistants and Nurse Practitioners) who all work together to provide you with the care you need, when you need it.  Your next appointment:   1 year(s)  Provider:   Rollene Rotunda, MD     We recommend signing up for the patient portal called "MyChart".  Sign up information is provided on this After Visit Summary.  MyChart is used to connect with patients for Virtual Visits (Telemedicine).  Patients are able to view lab/test results, encounter notes, upcoming appointments, etc.  Non-urgent messages can be sent to your provider as well.   To learn more about what you can do with MyChart, go to [email or web address].   Other Instructions       1st Floor: - Lobby - Registration  - Pharmacy  - Lab - Cafe  2nd Floor: - PV Lab - Diagnostic Testing (echo, CT, nuclear med)  3rd Floor: - Vacant  4th Floor: - TCTS (cardiothoracic surgery) - AFib Clinic - Structural Heart Clinic - Vascular Surgery  - Vascular Ultrasound  5th Floor: - HeartCare Cardiology (general and EP) - Clinical Pharmacy for coumadin, hypertension, lipid, weight-loss medications, and med management appointments    Valet parking services will be available as well.

## 2023-09-08 ENCOUNTER — Other Ambulatory Visit: Payer: Self-pay | Admitting: Cardiology

## 2023-09-08 DIAGNOSIS — M7989 Other specified soft tissue disorders: Secondary | ICD-10-CM

## 2023-10-06 ENCOUNTER — Telehealth: Payer: Self-pay | Admitting: Cardiology

## 2023-10-06 DIAGNOSIS — M7989 Other specified soft tissue disorders: Secondary | ICD-10-CM

## 2023-10-06 NOTE — Telephone Encounter (Signed)
 Pt c/o medication issue:  1. Name of Medication:   torsemide  (DEMADEX ) 20 MG tablet    2. How are you currently taking this medication (dosage and times per day)? 1 daily  3. Are you having a reaction (difficulty breathing--STAT)? No   4. What is your medication issue? [facility] Bar[facility] amount ... There seems to be some confusion

## 2023-10-06 NOTE — Telephone Encounter (Signed)
 Edward Baseman, PA states that there is conflicting info re: demadex   Bottle from home is Torsemide  20 mg with directions that say Torsemide  40 mg am and 40 mg 6 hrs later He states he's taking 20 mg daily.  She said he feet are severely swollen and the 20 mg is clearly not enough.  He has open blisters on his legs that have opened because his legs are so swollen.  Reviewed med list and advised that the 40 mg am and 6 hrs later instructions seem to be from an old bottle from 2023.  She is not w the patient at this time.  Adv the only other torsemide  order says historical provider so I am not sure who the prescriber is.  She is going to review recent labs and adjust dose. I asked her to send her note to Dr. Lavonne Prairie for review and provided our fax number.

## 2023-10-09 NOTE — Telephone Encounter (Signed)
 Spoke with pt's wife (per DPR) regarding his dosage of Torsemide . The bottle says to take 2 tablets (40 mg) twice daily. Pt's wife stated she does not think he is taking that much. Wife was asked to have pt call back to clarify what he is taking. Wife verbalized understanding. All questions if any were answered.

## 2023-10-10 NOTE — Telephone Encounter (Signed)
 Patient's wife is following up requesting a call back from [place], [place], if possible.

## 2023-10-13 NOTE — Telephone Encounter (Signed)
 Spoke with pt regarding his medications. Pt stated the bottle of Torsemide  reads take 2 tablets (20 mg) and then take 2 tablets (20 mg) 6 hours later. Pt stated he just takes 20 mg once a day when his feet are swollen. Pt was told Dr. Denver suggestion to take 40 mg daily for 3 days then take 20 mg daily and get a BMET in 1 week. BMET ordered and released. Pt verbalized understanding. All questions if any were answered.

## 2023-10-13 NOTE — Addendum Note (Signed)
 Addended by: Ardean Simonich N on: 10/13/2023 10:24 AM   Modules accepted: Orders

## 2023-10-15 ENCOUNTER — Telehealth: Payer: Self-pay | Admitting: Cardiology

## 2023-10-15 NOTE — Telephone Encounter (Signed)
 10/13/23 10:24 AM Note Spoke with pt regarding his medications. Pt stated the bottle of Torsemide  reads take 2 tablets (20 mg) and then take 2 tablets (20 mg) 6 hours later. Pt stated he just takes 20 mg once a day when his feet are swollen. Pt was told Dr. Denver suggestion to take 40 mg daily for 3 days then take 20 mg daily and get a BMET in 1 week. BMET ordered and released. Pt verbalized understanding. All questions if any were answered.    (40 mg daily for 3 days, then 20 mg daily- BMET in one week)  Started taking 40 mg 10/14/23- Will need to get lab work on 10/21/23- anytime that day. Can be done at any [facility]. She verbalized understanding.

## 2023-10-15 NOTE — Telephone Encounter (Signed)
 Pt c/o medication issue:  1. Name of Medication:   torsemide  (DEMADEX ) 20 MG tablet   2. How are you currently taking this medication (dosage and times per day)?     3. Are you having a reaction (difficulty breathing--STAT)?   4. What is your medication issue?   Wife Versa) wants to know on which day patient should do his lab work.  Also, wife wants to know if patient should do the lab work on the last day of the week of the medication change and delay taking the medication prior to blood test.

## 2023-11-26 ENCOUNTER — Telehealth: Payer: Self-pay | Admitting: *Deleted

## 2023-11-26 DIAGNOSIS — R7303 Prediabetes: Secondary | ICD-10-CM

## 2023-11-26 DIAGNOSIS — R972 Elevated prostate specific antigen [PSA]: Secondary | ICD-10-CM

## 2023-11-26 DIAGNOSIS — Z125 Encounter for screening for malignant neoplasm of prostate: Secondary | ICD-10-CM

## 2023-11-26 DIAGNOSIS — E7800 Pure hypercholesterolemia, unspecified: Secondary | ICD-10-CM

## 2023-11-26 NOTE — Telephone Encounter (Signed)
-----   Message from Harlene Du sent at 11/26/2023 12:13 PM EDT ----- Regarding: Lab Tues 12/09/23 Hello,  Patient is coming in for CPE labs on Tuesday 12/09/23. Can we get orders please.   Thanks

## 2023-12-09 ENCOUNTER — Other Ambulatory Visit (INDEPENDENT_AMBULATORY_CARE_PROVIDER_SITE_OTHER): Payer: Medicare Other

## 2023-12-09 DIAGNOSIS — R7303 Prediabetes: Secondary | ICD-10-CM | POA: Diagnosis not present

## 2023-12-09 DIAGNOSIS — E7800 Pure hypercholesterolemia, unspecified: Secondary | ICD-10-CM

## 2023-12-09 DIAGNOSIS — R972 Elevated prostate specific antigen [PSA]: Secondary | ICD-10-CM

## 2023-12-09 DIAGNOSIS — Z125 Encounter for screening for malignant neoplasm of prostate: Secondary | ICD-10-CM

## 2023-12-10 ENCOUNTER — Ambulatory Visit: Payer: Self-pay | Admitting: Family Medicine

## 2023-12-10 LAB — LIPID PANEL
Cholesterol: 181 mg/dL (ref 0–200)
HDL: 37.3 mg/dL — ABNORMAL LOW (ref >39.00)
LDL Cholesterol: 75 mg/dL (ref 0–99)
NonHDL: 143.71
Total CHOL/HDL Ratio: 5
Triglycerides: 343 mg/dL — ABNORMAL HIGH (ref 0.0–149.0)
VLDL: 68.6 mg/dL — ABNORMAL HIGH (ref 0.0–40.0)

## 2023-12-10 LAB — COMPREHENSIVE METABOLIC PANEL WITH GFR
ALT: 27 U/L (ref 0–53)
AST: 25 U/L (ref 0–37)
Albumin: 4.7 g/dL (ref 3.5–5.2)
Alkaline Phosphatase: 58 U/L (ref 39–117)
BUN: 18 mg/dL (ref 6–23)
CO2: 28 meq/L (ref 19–32)
Calcium: 9.7 mg/dL (ref 8.4–10.5)
Chloride: 103 meq/L (ref 96–112)
Creatinine, Ser: 1.18 mg/dL (ref 0.40–1.50)
GFR: 58.51 mL/min — ABNORMAL LOW (ref >60.00)
Glucose, Bld: 99 mg/dL (ref 70–99)
Potassium: 4.5 meq/L (ref 3.5–5.1)
Sodium: 141 meq/L (ref 135–145)
Total Bilirubin: 0.7 mg/dL (ref 0.2–1.2)
Total Protein: 7.1 g/dL (ref 6.0–8.3)

## 2023-12-10 LAB — HEMOGLOBIN A1C: Hgb A1c MFr Bld: 6.4 % (ref 4.6–6.5)

## 2023-12-10 NOTE — Progress Notes (Signed)
 No critical labs need to be addressed urgently. We will discuss labs in detail at upcoming office visit.

## 2023-12-11 LAB — PSA, TOTAL AND FREE
PSA, % Free: 41 % (ref >25)
PSA, Free: 1.2 ng/mL
PSA, Total: 2.9 ng/mL (ref <=4.0)

## 2023-12-12 ENCOUNTER — Ambulatory Visit (INDEPENDENT_AMBULATORY_CARE_PROVIDER_SITE_OTHER)

## 2023-12-12 VITALS — Ht 70.0 in | Wt 275.0 lb

## 2023-12-12 DIAGNOSIS — Z0000 Encounter for general adult medical examination without abnormal findings: Secondary | ICD-10-CM

## 2023-12-12 NOTE — Progress Notes (Signed)
 Subjective:   Edward Frank is a 78 y.o. who presents for a Medicare Wellness preventive visit.  As a reminder, Annual Wellness Visits don't include a physical exam, and some assessments may be limited, especially if this visit is performed virtually. We may recommend an in-person follow-up visit with your provider if needed.  Visit Complete: Virtual I connected with  Edward Frank on 12/12/23 by a audio enabled telemedicine application and verified that I am speaking with the correct person using two identifiers.  Patient Location: Home  Provider Location: Office/Clinic  I discussed the limitations of evaluation and management by telemedicine. The patient expressed understanding and agreed to proceed.  Vital Signs: Because this visit was a virtual/telehealth visit, some criteria may be missing or patient reported. Any vitals not documented were not able to be obtained and vitals that have been documented are patient reported.  VideoDeclined- This patient declined Librarian, academic. Therefore the visit was completed with audio only.  Persons Participating in Visit: Patient.  AWV Questionnaire: No: Patient Medicare AWV questionnaire was not completed prior to this visit.  Cardiac Risk Factors include: advanced age (>58men, >65 women);dyslipidemia;hypertension;male gender;obesity (BMI >30kg/m2);sedentary lifestyle     Objective:    Today's Vitals   12/12/23 1135 12/12/23 1136  Weight: 275 lb (124.7 kg)   Height: 5' 10 (1.778 m)   PainSc:  6    Body mass index is 39.46 kg/m.     12/12/2023   11:51 AM 08/02/2019    8:36 AM 07/29/2019    2:45 PM 06/15/2018    8:35 PM 05/28/2018   11:19 AM 05/25/2018   10:50 AM 06/13/2016    1:00 PM  Advanced Directives  Does Patient Have a Medical Advance Directive? Yes No No Yes  Yes  Yes  No   Type of Estate agent of [place];Living will   Healthcare Power of [employer] Power of  [employer] Power of Attorney   Does patient want to make changes to medical advance directive?     No - Patient declined     Copy of Healthcare Power of Attorney in Chart? No - copy requested    No - copy requested  No - copy requested    Would patient like information on creating a medical advance directive?  No - Patient declined No - Patient declined   No - Patient declined  No - Patient declined      Data saved with a previous flowsheet row definition    Current Medications (verified) Outpatient Encounter Medications as of 12/12/2023  Medication Sig   Albuterol  Sulfate (PROAIR  RESPICLICK) 108 (90 Base) MCG/ACT AEPB Inhale 1-2 puffs into the lungs every 4 (four) hours as needed.   allopurinol (ZYLOPRIM) 100 MG tablet Take 100 mg by mouth daily.   ascorbic acid (VITAMIN C) 1000 MG tablet Take 1,000 mg by mouth daily.   aspirin  EC 81 MG tablet Take 81 mg by mouth daily.   azelastine  (ASTELIN ) 0.1 % nasal spray Place 2 sprays into both nostrils 2 (two) times daily. Use in each nostril as directed   baclofen  (LIORESAL ) 10 MG tablet Take 1-2 tablets (10-20 mg total) by mouth 3 (three) times daily.   Cholecalciferol 125 MCG (5000 UT) capsule Take by mouth.   Coenzyme Q10 (COQ10) 400 MG CAPS Take 400 mg by mouth daily.   Cranberry 500 MG CAPS Take 500 mg by mouth 2 (two) times daily.    Cyanocobalamin (VITAMIN B-12)  2500 MCG SUBL Place 2,500 mcg under the tongue daily.   desonide (DESOWEN) 0.05 % cream Apply 1 application topically 2 (two) times daily as needed (irritation).    hydrocortisone  (ANUSOL -HC) 2.5 % rectal cream Place 1 application rectally 2 (two) times daily as needed for hemorrhoids or anal itching.   indomethacin (INDOCIN) 25 MG capsule Take 25 mg by mouth daily.    losartan  (COZAAR ) 50 MG tablet Take 1 tablet by mouth daily.   methocarbamol  (ROBAXIN ) 750 MG tablet Take 750 mg by mouth 2 (two) times daily.    mometasone-formoterol (DULERA) 100-5 MCG/ACT AERO Inhale 2  puffs into the lungs 2 (two) times daily.   Multiple Vitamin (MULTIVITAMIN WITH MINERALS) TABS Take 1 tablet by mouth daily.   Omega-3 Fatty Acids (FISH OIL TRIPLE STRENGTH) 1400 MG CAPS Take 2,800 mg by mouth 2 (two) times daily.    OVER THE COUNTER MEDICATION Take 1 tablet by mouth daily. Calcium citrate+D3 500-800 mg   potassium chloride  SA (KLOR-CON  M) 20 MEQ tablet Take 1 tablet by mouth once daily   pravastatin  (PRAVACHOL ) 40 MG tablet Take 1 tablet (40 mg total) by mouth daily.   RESVERATROL 100 MG CAPS Take 100 mg by mouth daily.    torsemide  (DEMADEX ) 20 MG tablet Take 10 mg by mouth daily.   Turmeric (QC TUMERIC COMPLEX PO) Take by mouth.   zinc gluconate 50 MG tablet Take 50 mg by mouth daily.   amoxicillin -clavulanate (AUGMENTIN ) 875-125 MG tablet Take 1 tablet by mouth 2 (two) times daily. (Patient not taking: Reported on 12/12/2023)   chlorpheniramine-HYDROcodone  (TUSSIONEX) 10-8 MG/5ML Take 5 mLs by mouth every 12 (twelve) hours as needed for cough. (Patient not taking: Reported on 12/12/2023)   predniSONE  (DELTASONE ) 20 MG tablet 3 tabs by mouth daily x 3 days, then 2 tabs by mouth daily x 2 days then 1 tab by mouth daily x 2 days (Patient not taking: Reported on 12/12/2023)   No facility-administered encounter medications on file as of 12/12/2023.    Allergies (verified) Gabapentin and Riluzole   History: Past Medical History:  Diagnosis Date   Arthritis    Bradycardia    Fatty liver    Fracture, tibia, with fibula teenage yrs.    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   OSA (obstructive sleep apnea) 01/22/2011   PONV (postoperative nausea and vomiting)    Primary lateral sclerosis (HCC)    Right bundle branch block    Syncope    Past Surgical History:  Procedure Laterality Date   CHOLECYSTECTOMY N/A 06/13/2016    Procedure: LAPAROSCOPIC CHOLECYSTECTOMY;  Surgeon: Herlene Righter Kinsinger, MD;  Location: [facility];  Service: General;  Laterality: N/A;   COLONOSCOPY WITH PROPOFOL  N/A 08/02/2019   Procedure: COLONOSCOPY WITH PROPOFOL ;  Surgeon: Shaaron Lamar HERO, MD;  Location: [facility];  Service: Endoscopy;  Laterality: N/A;  10:00am   EYE SURGERY     Cataracts bil   FOOT SURGERY  11-2008   hammer toe and bunion   HERNIA REPAIR  12-2001   inguinal hernia bilateral   POLYPECTOMY  08/02/2019   Procedure: POLYPECTOMY;  Surgeon: Shaaron Lamar HERO, MD;  Location: [facility];  Service: Endoscopy;;   REVERSE SHOULDER ARTHROPLASTY Left 05/28/2018   Procedure: REVERSE SHOULDER ARTHROPLASTY;  Surgeon: Melita Drivers, MD;  Location: [facility];  Service: Orthopedics;  Laterality: Left;    SHOULDER ARTHROSCOPY WITH ROTATOR CUFF REPAIR AND SUBACROMIAL DECOMPRESSION Right 06/18/2012   Procedure: RIGHT SHOULDER ARTHROSCOPY WITH SUBACROMIAL DECOMPRESSION AND DISTAL CLAVICLE RESECTION AND ROTATOR CUFF REPAIR;  Surgeon: Franky CHRISTELLA Pointer, MD;  Location: [facility];  Service: Orthopedics;  Laterality: Right;   SHOULDER SURGERY  1977   Luxating    Family History  Problem Relation Age of Onset   Heart disease Mother        ? afib and got pacemaker   Uterine cancer Mother    Prostate cancer Father    Hypertension Father    Kidney failure Father    Stroke Father    Colon cancer Sister 43   Social History   Socioeconomic History   Marital status: Married    Spouse name: Not on file   Number of children: Not on file   Years of education: Not on file   Highest education level: Not on file  Occupational History   Occupation: Retired     Associate Professor: [employer]  Tobacco Use   Smoking status: Never   Smokeless tobacco: Never  Vaping Use   Vaping status: Never Used  Substance and Sexual Activity   Alcohol use: Yes    Alcohol/week: 0.0 standard drinks of alcohol    Comment: rarely   Drug use: No   Sexual activity: Yes   Other Topics Concern   Not on file  Social History Narrative   Minimal exercise: due to PLS.   Veterinarian   Healthy diet.   Full Code.   No living will, no HCPOA. 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  Known agent orange exposure.     Social Drivers of Corporate investment banker Strain: Low Risk  (12/12/2023)   Overall Financial Resource Strain (CARDIA)    Difficulty of Paying Living Expenses: Not hard at all  Food Insecurity: No Food Insecurity (12/12/2023)   Hunger Vital Sign    Worried About Running Out of Food in the Last Year: Never true    Ran Out of Food in the Last Year: Never true  Transportation Needs: No Transportation Needs (12/12/2023)   PRAPARE - Administrator, Civil Service (Medical): No    Lack of Transportation (Non-Medical): No  Physical Activity: Inactive (12/12/2023)   Exercise Vital Sign    Days of Exercise per Week: 0 days    Minutes of Exercise per Session: 0 min  Stress: No Stress Concern Present (12/12/2023)   [employer] of Occupational Health - Occupational Stress Questionnaire    Feeling of Stress: Only a little  Social Connections: Socially Isolated (12/12/2023)   Social Connection and Isolation Panel    Frequency of Communication with Friends and Family: Once a week    Frequency of Social Gatherings with Friends and Family: Never    Attends Religious Services: Never    Database administrator or Organizations: No    Attends Banker Meetings: Never    Marital Status: Widowed    Tobacco Counseling Counseling given: Not Answered    Clinical Intake:  Pre-visit preparation completed: Yes  Pain : 0-10 Pain Score: 6  Pain Type: Chronic pain Pain Location: Generalized Pain Descriptors / Indicators: Aching Pain Onset: More than a month ago Pain Frequency: Constant Pain Relieving Factors: tylenol   Pain Relieving Factors: tylenol   BMI - recorded:  39.46 Nutritional Status: BMI >  30  Obese Nutritional Risks: None Diabetes: No  Lab Results  Component Value Date   HGBA1C 6.4 12/09/2023   HGBA1C 6.0 12/09/2022   HGBA1C 6.3 12/04/2021     How often do you need to have someone help you when you read instructions, pamphlets, or other written materials from your doctor or pharmacy?: 1 - Never  Interpreter Needed?: No  Comments: son lives with pt Information entered by :: B.Tomoki Lucken,LPN   Activities of Daily Living     12/12/2023   11:51 AM  In your present state of health, do you have any difficulty performing the following activities:  Hearing? 0  Vision? 0  Difficulty concentrating or making decisions? 0  Walking or climbing stairs? 1  Dressing or bathing? 0  Doing errands, shopping? 1  Preparing Food and eating ? N  Using the Toilet? N  In the past six months, have you accidently leaked urine? Y  Do you have problems with loss of bowel control? N  Managing your Medications? N  Managing your Finances? N  Housekeeping or managing your Housekeeping? Y    Patient Care Team: Avelina Greig BRAVO, MD as PCP - General Lavona Agent, MD as PCP - Cardiology (Cardiology) Shaaron Lamar HERO, MD as Consulting Physician (Gastroenterology) Refugia Slack  I have updated your Care Teams any recent Medical Services you may have received from other providers in the past year.     Assessment:   This is a routine wellness examination for Levi.  Hearing/Vision screen Hearing Screening - Comments:: Patient denies any hearing difficulties.   Vision Screening - Comments:: Pt says their vision is good with glasses/readers [facility]   Goals Addressed             This Visit's Progress    Patient Stated       I would like to go and get out of the house during hunting season       Depression Screen     12/12/2023   11:45 AM 01/28/2023    2:27 PM 01/21/2023   12:54 PM 12/13/2022    3:01 PM 12/08/2020   11:35 AM 12/07/2019   12:46 PM 12/04/2018     2:36 PM  PHQ 2/9 Scores  PHQ - 2 Score 1 0 0 0 0 0 0  PHQ- 9 Score  3 4        Fall Risk     12/12/2023   11:39 AM 01/28/2023    2:27 PM 01/21/2023   12:54 PM 12/13/2022    3:01 PM 12/13/2022    3:00 PM  Fall Risk   Falls in the past year? 1 1 1 1    Number falls in past yr: 1 1 1 1 1   Injury with Fall? 0 1 0 0 1  Risk for fall due to : Impaired balance/gait;Impaired mobility;Other (Comment) History of fall(s) Impaired balance/gait Impaired mobility Impaired mobility  Risk for fall due to: Comment Dene Karvonen dz      Follow up Education provided;Falls prevention discussed Falls evaluation completed Falls evaluation completed Falls evaluation completed Falls evaluation completed    MEDICARE RISK AT HOME:  Medicare Risk at Home Any stairs in or around the home?: No (ramps) If so, are there any without handrails?: Yes Home free of loose throw rugs in walkways, pet beds, electrical cords, etc?: Yes Adequate lighting in your home to reduce risk of falls?: Yes Life alert?: Yes Use of a cane, walker or w/c?: Yes Grab bars in the  bathroom?: Yes Shower chair or bench in shower?: Yes Elevated toilet seat or a handicapped toilet?: Yes  TIMED UP AND GO:  Was the test performed?  No  Cognitive Function: 6CIT completed        12/12/2023   11:52 AM  6CIT Screen  What Year? 0 points  What month? 0 points  What time? 0 points  Count back from 20 0 points  Months in reverse 0 points  Repeat phrase 0 points  Total Score 0 points    Immunizations Immunization History  Administered Date(s) Administered   Influenza Whole 01/20/2009, 02/13/2010, 01/21/2012   Influenza, High Dose Seasonal PF 01/23/2017, 01/21/2019   Influenza,inj,quad, With Preservative 01/20/2017, 01/20/2018   Influenza-Unspecified 01/20/2017, 01/20/2018, 01/14/2023   Moderna Sars-Covid-2 Vaccination 06/23/2019, 07/21/2019   Pneumococcal Conjugate-13 07/21/2013   Pneumococcal Polysaccharide-23 04/22/2008,  11/25/2017   Tdap 01/20/2017   Zoster Recombinant(Shingrix) 08/12/2017   Zoster, Live 04/23/2003    Screening Tests Health Maintenance  Topic Date Due   COVID-19 Vaccine (3 - 2024-25 season) 12/22/2022   INFLUENZA VACCINE  11/21/2023   Medicare Annual Wellness (AWV)  12/11/2024   DTaP/Tdap/Td (2 - Td or Tdap) 01/21/2027   Pneumococcal Vaccine: 50+ Years  Completed   Hepatitis C Screening  Completed   HPV VACCINES  Aged Out   Meningococcal B Vaccine  Aged Out   Colonoscopy  Discontinued   Zoster Vaccines- Shingrix  Discontinued    Health Maintenance  Health Maintenance Due  Topic Date Due   COVID-19 Vaccine (3 - 2024-25 season) 12/22/2022   INFLUENZA VACCINE  11/21/2023   Health Maintenance Items Addressed: None at this time  Additional Screening:  Vision Screening: Recommended annual ophthalmology exams for early detection of glaucoma and other disorders of the eye. Would you like a referral to an eye doctor? No    Dental Screening: Recommended annual dental exams for proper oral hygiene  Community Resource Referral / Chronic Care Management: CRR required this visit?  No   CCM required this visit?  No   Plan:    I have personally reviewed and noted the following in the patient's chart:   Medical and social history Use of alcohol, tobacco or illicit drugs  Current medications and supplements including opioid prescriptions. Patient is not currently taking opioid prescriptions. Functional ability and status Nutritional status Physical activity Advanced directives List of other physicians Hospitalizations, surgeries, and ER visits in previous 12 months Vitals Screenings to include cognitive, depression, and falls Referrals and appointments  In addition, I have reviewed and discussed with patient certain preventive protocols, quality metrics, and best practice recommendations. A written personalized care plan for preventive services as well as general  preventive health recommendations were provided to patient.   Erminio LITTIE Saris, LPN   [id number]   After Visit Summary: (Declined) Due to this being a telephonic visit, with patients personalized plan was offered to patient but patient Declined AVS at this time   Notes: Please refer to Routing Comments.

## 2023-12-12 NOTE — Progress Notes (Signed)
So sorry to hear that.

## 2023-12-12 NOTE — Progress Notes (Signed)
 No critical labs need to be addressed urgently. We will discuss labs in detail at upcoming office visit.

## 2023-12-12 NOTE — Patient Instructions (Signed)
 [name] , Thank you for taking time out of your busy schedule to complete your Annual Wellness Visit with me. I enjoyed our conversation and look forward to speaking with you again next year. I, as well as your care team,  appreciate your ongoing commitment to your health goals. Please review the following plan we discussed and let me know if I can assist you in the future. Your Game plan/ To Do List    Referrals: If you haven't heard from the office you've been referred to, please reach out to them at the phone provided.   Follow up Visits: We will see or speak with you next year for your Next Medicare AWV with our clinical staff Have you seen your provider in the last 6 months (3 months if uncontrolled diabetes)? No  Clinician Recommendations:  Aim for 30 minutes of exercise or brisk walking, 6-8 glasses of water , and 5 servings of fruits and vegetables each day.       This is a list of the screenings recommended for you:  Health Maintenance  Topic Date Due   COVID-19 Vaccine (3 - 2024-25 season) 12/22/2022   Flu Shot  11/21/2023   Medicare Annual Wellness Visit  12/11/2024   DTaP/Tdap/Td vaccine (2 - Td or Tdap) 01/21/2027   Pneumococcal Vaccine for age over 87  Completed   Hepatitis C Screening  Completed   HPV Vaccine  Aged Out   Meningitis B Vaccine  Aged Out   Colon Cancer Screening  Discontinued   Zoster (Shingles) Vaccine  Discontinued    Advanced directives: (Copy Requested) Please bring a copy of your health care power of attorney and living will to the office to be added to your chart at your convenience. You can mail to [facility] [address] [place], [place] [address] or email to [email or web address] Advance Care Planning is important because it:  [x]  Makes sure you receive the medical care that is consistent with your values, goals, and preferences  [x]  It provides guidance to your family and loved ones and reduces their decisional  burden about whether or not they are making the right decisions based on your wishes.  Follow the link provided in your after visit summary or read over the paperwork we have mailed to you to help you started getting your Advance Directives in place. If you need assistance in completing these, please reach out to us  so that we can help you!

## 2023-12-16 ENCOUNTER — Ambulatory Visit (INDEPENDENT_AMBULATORY_CARE_PROVIDER_SITE_OTHER): Payer: Medicare Other | Admitting: Family Medicine

## 2023-12-16 ENCOUNTER — Encounter: Payer: Self-pay | Admitting: Family Medicine

## 2023-12-16 VITALS — BP 120/80 | HR 77 | Temp 98.4°F | Ht 70.0 in | Wt 276.2 lb

## 2023-12-16 DIAGNOSIS — Z0000 Encounter for general adult medical examination without abnormal findings: Secondary | ICD-10-CM | POA: Diagnosis not present

## 2023-12-16 DIAGNOSIS — I10 Essential (primary) hypertension: Secondary | ICD-10-CM

## 2023-12-16 DIAGNOSIS — I714 Abdominal aortic aneurysm, without rupture, unspecified: Secondary | ICD-10-CM | POA: Diagnosis not present

## 2023-12-16 DIAGNOSIS — E7800 Pure hypercholesterolemia, unspecified: Secondary | ICD-10-CM

## 2023-12-16 DIAGNOSIS — G1223 Primary lateral sclerosis: Secondary | ICD-10-CM

## 2023-12-16 NOTE — Assessment & Plan Note (Signed)
Stable, chronic.  Continue current medication.  Losartan 50 mg p.o. daily

## 2023-12-16 NOTE — Progress Notes (Signed)
 Patient ID: [name], male    DOB: January 15, 1946, 78 y.o.   MRN: [id number]  This visit was conducted in person.  BP 120/80   Pulse 77   Temp 98.4 F (36.9 C) (Temporal)   Ht 5' 10 (1.778 m) Comment: Patient reported  Wt 276 lb 4 oz (125.3 kg)   SpO2 95%   BMI 39.64 kg/m    CC:  Chief Complaint  Patient presents with   Annual Exam    Subjective:   HPI: Edward Frank is a 78 y.o. male presenting on 12/16/2023 for Annual Exam The patient presents for  complete physical and review of chronic health problems. He/She also has the following acute concerns today: none  The patient saw a LPN or RN for medicare wellness visit. 12/12/23  Prevention and wellness was reviewed in detail. Note reviewed and important notes copied below if needed.  His wife passed away unexpectedly recently. Edward Frank  PHQ-2 Total Score 1     PLS/upper motor dominant AS: Continue slow progression change.  Was followed at [facility]. Now followed by Dr. Lucillie   at [facility]. He has walker and cane.  less endurance, weaker over all. 12/03/2022 OV Dr. Aubrey reviewed re: oropharyngeal dysphagia.SABRA referred to speech therapy Currently doing outpatient rehab for muscle weakness.  He has son now able to help with meals and dressing. He is able to feed and bathe himself.  Not interested in home health...  [facility]   Hypertension:  Well controlled on losartan  50 mg p.o. daily BP Readings from Last 3 Encounters:  12/16/23 120/80  07/18/23 126/79  01/28/23 120/68  Using medication without problems or lightheadedness:  none Chest pain with exertion: none Edema:resolved Short of breath: stable Average home BPs: Other issues:  Elevated Cholesterol: LDL at goal on pravastatin  20 mg p.o. daily Lab Results  Component Value Date   CHOL 181 12/09/2023   HDL 37.30 (L) 12/09/2023   LDLCALC 75 12/09/2023   LDLDIRECT 85.0  12/09/2022   TRIG 343.0 (H) 12/09/2023   CHOLHDL 5 12/09/2023  Using medications without problems: none Muscle aches:  none Diet compliance: moderate Exercise: limited Other complaints:  Prediabetes  Lab Results  Component Value Date   HGBA1C 6.4 12/09/2023    Wt Readings from Last 3 Encounters:  12/16/23 276 lb 4 oz (125.3 kg)  12/12/23 275 lb (124.7 kg)  07/18/23 275 lb (124.7 kg)     AAA:  3.6 cm on CT in Feb of 2019. 2023 ultrasound AAA duplex showed no significant change in size Reviewed Cardiology note 06/2023 Dr. Lavona.SABRA no further imaging required. Stable pedal edema and DOE.  On demedex 40 mg daily, with potassium.SABRA  ECHO: 09/2021 EF 60-65%  Gout on allopurinol for prevention. No flares.   Relevant past medical, surgical, family and social history reviewed and updated as indicated. Interim medical history since our last visit reviewed. Allergies and medications reviewed and updated. Outpatient Medications Prior to Visit  Medication Sig Dispense Refill   Albuterol  Sulfate (PROAIR  RESPICLICK) 108 (90 Base) MCG/ACT AEPB Inhale 1-2 puffs into the lungs every 4 (four) hours as needed.     allopurinol (ZYLOPRIM) 100 MG tablet Take 100 mg by mouth daily.     ascorbic acid (VITAMIN C) 1000 MG tablet Take 1,000 mg by mouth daily.     aspirin  EC 81 MG tablet Take 81 mg by mouth daily.  azelastine  (ASTELIN ) 0.1 % nasal spray Place 2 sprays into both nostrils 2 (two) times daily. Use in each nostril as directed 30 mL 12   baclofen  (LIORESAL ) 10 MG tablet Take 1-2 tablets (10-20 mg total) by mouth 3 (three) times daily. 180 each 3   Cholecalciferol 125 MCG (5000 UT) capsule Take by mouth.     Coenzyme Q10 (COQ10) 400 MG CAPS Take 400 mg by mouth daily.     Cranberry 500 MG CAPS Take 500 mg by mouth 2 (two) times daily.      Cyanocobalamin (VITAMIN B-12) 2500 MCG SUBL Place 2,500 mcg under the tongue daily.     desonide (DESOWEN) 0.05 % cream Apply 1 application topically 2  (two) times daily as needed (irritation).      hydrocortisone  (ANUSOL -HC) 2.5 % rectal cream Place 1 application rectally 2 (two) times daily as needed for hemorrhoids or anal itching.     indomethacin (INDOCIN) 25 MG capsule Take 25 mg by mouth daily.      losartan  (COZAAR ) 50 MG tablet Take 1 tablet by mouth daily.     methocarbamol  (ROBAXIN ) 750 MG tablet Take 750 mg by mouth 2 (two) times daily.      mometasone-formoterol (DULERA) 100-5 MCG/ACT AERO Inhale 2 puffs into the lungs 2 (two) times daily.     Multiple Vitamin (MULTIVITAMIN WITH MINERALS) TABS Take 1 tablet by mouth daily.     Omega-3 Fatty Acids (FISH OIL TRIPLE STRENGTH) 1400 MG CAPS Take 2,800 mg by mouth 2 (two) times daily.      OVER THE COUNTER MEDICATION Take 1 tablet by mouth daily. Calcium citrate+D3 500-800 mg     potassium chloride  SA (KLOR-CON  M) 20 MEQ tablet Take 1 tablet by mouth once daily 90 tablet 3   pravastatin  (PRAVACHOL ) 40 MG tablet Take 1 tablet (40 mg total) by mouth daily. 90 tablet 3   RESVERATROL 100 MG CAPS Take 100 mg by mouth daily.      torsemide  (DEMADEX ) 20 MG tablet Take 10 mg by mouth daily.     Turmeric (QC TUMERIC COMPLEX PO) Take by mouth.     zinc gluconate 50 MG tablet Take 50 mg by mouth daily.     amoxicillin -clavulanate (AUGMENTIN ) 875-125 MG tablet Take 1 tablet by mouth 2 (two) times daily. (Patient not taking: Reported on 12/12/2023) 20 tablet 0   chlorpheniramine-HYDROcodone  (TUSSIONEX) 10-8 MG/5ML Take 5 mLs by mouth every 12 (twelve) hours as needed for cough. (Patient not taking: Reported on 12/12/2023) 100 mL 0   predniSONE  (DELTASONE ) 20 MG tablet 3 tabs by mouth daily x 3 days, then 2 tabs by mouth daily x 2 days then 1 tab by mouth daily x 2 days (Patient not taking: Reported on 12/12/2023) 15 tablet 0   No facility-administered medications prior to visit.     Per HPI unless specifically indicated in ROS section below Review of Systems  Constitutional:  Negative for fatigue and  fever.  HENT:  Negative for ear pain.   Eyes:  Negative for pain.  Respiratory:  Negative for cough and shortness of breath.   Cardiovascular:  Negative for chest pain, palpitations and leg swelling.  Gastrointestinal:  Negative for abdominal pain.  Genitourinary:  Negative for dysuria.  Musculoskeletal:  Negative for arthralgias.  Neurological:  Negative for syncope, light-headedness and headaches.  Psychiatric/Behavioral:  Negative for dysphoric mood.    Objective:  BP 120/80   Pulse 77   Temp 98.4 F (36.9 C) (Temporal)  Ht 5' 10 (1.778 m) Comment: Patient reported  Wt 276 lb 4 oz (125.3 kg)   SpO2 95%   BMI 39.64 kg/m   Wt Readings from Last 3 Encounters:  12/16/23 276 lb 4 oz (125.3 kg)  12/12/23 275 lb (124.7 kg)  07/18/23 275 lb (124.7 kg)      Physical Exam Exam conducted with a chaperone present.  Constitutional:      General: He is not in acute distress.    Appearance: Normal appearance. He is well-developed. He is not ill-appearing or toxic-appearing.  HENT:     Head: Normocephalic and atraumatic.     Right Ear: Hearing, tympanic membrane, ear canal and external ear normal.     Left Ear: Hearing, tympanic membrane, ear canal and external ear normal.     Nose: Nose normal.     Mouth/Throat:     Pharynx: Uvula midline.  Eyes:     General: Lids are normal. Lids are everted, no foreign bodies appreciated.     Conjunctiva/sclera: Conjunctivae normal.     Pupils: Pupils are equal, round, and reactive to light.  Neck:     Thyroid : No thyroid  mass or thyromegaly.     Vascular: No carotid bruit.     Trachea: Trachea and phonation normal.  Cardiovascular:     Rate and Rhythm: Normal rate and regular rhythm.     Pulses: Normal pulses.     Heart sounds: S1 normal and S2 normal. No murmur heard.    No gallop.  Pulmonary:     Breath sounds: Normal breath sounds. No wheezing, rhonchi or rales.  Abdominal:     General: Bowel sounds are normal.     Palpations:  Abdomen is soft.     Tenderness: There is no abdominal tenderness. There is no guarding or rebound.     Hernia: No hernia is present.  Genitourinary:    Prostate: Enlarged. Not tender and no nodules present.     Rectum: Normal.     Comments: Redness on buttocks from sitting., no decubitus. Musculoskeletal:     Cervical back: Normal range of motion and neck supple.  Lymphadenopathy:     Cervical: No cervical adenopathy.  Skin:    General: Skin is warm and dry.     Findings: No rash.  Neurological:     Mental Status: He is alert and oriented to person, place, and time.     Cranial Nerves: No cranial nerve deficit.     Sensory: No sensory deficit.     Motor: Weakness and abnormal muscle tone present.     Coordination: Coordination abnormal. Finger-Nose-Finger Test abnormal and Heel to [facility] Test abnormal.     Gait: Gait abnormal and tandem walk abnormal.     Deep Tendon Reflexes: Reflexes are normal and symmetric.  Psychiatric:        Speech: Speech normal.        Behavior: Behavior normal.        Judgment: Judgment normal.       Results for orders placed or performed in visit on 12/09/23  Hemoglobin A1c   Collection Time: 12/09/23  3:25 PM  Result Value Ref Range   Hgb A1c MFr Bld 6.4 4.6 - 6.5 %  Lipid panel   Collection Time: 12/09/23  3:25 PM  Result Value Ref Range   Cholesterol 181 0 - 200 mg/dL   Triglycerides 656.9 (H) 0.0 - 149.0 mg/dL   HDL 62.69 (L) >60.99 mg/dL   VLDL 31.3 (H)  0.0 - 40.0 mg/dL   LDL Cholesterol 75 0 - 99 mg/dL   Total CHOL/HDL Ratio 5    NonHDL 143.71   Comprehensive metabolic panel   Collection Time: 12/09/23  3:25 PM  Result Value Ref Range   Sodium 141 135 - 145 mEq/L   Potassium 4.5 3.5 - 5.1 mEq/L   Chloride 103 96 - 112 mEq/L   CO2 28 19 - 32 mEq/L   Glucose, Bld 99 70 - 99 mg/dL   BUN 18 6 - 23 mg/dL   Creatinine, Ser 8.81 0.40 - 1.50 mg/dL   Total Bilirubin 0.7 0.2 - 1.2 mg/dL   Alkaline Phosphatase 58 39 - 117 U/L   AST 25 0 -  37 U/L   ALT 27 0 - 53 U/L   Total Protein 7.1 6.0 - 8.3 g/dL   Albumin 4.7 3.5 - 5.2 g/dL   GFR 41.48 (L) >39.99 mL/min   Calcium 9.7 8.4 - 10.5 mg/dL  PSA, total and free   Collection Time: 12/09/23  3:25 PM  Result Value Ref Range   PSA, Total 2.9 < OR = 4.0 ng/mL   PSA, Free 1.2 ng/mL   PSA, % Free 41 >25 % (calc)     COVID 19 screen:  No recent travel or known exposure to COVID19 The patient denies respiratory symptoms of COVID 19 at this time. The importance of social distancing was discussed today.   Assessment and Plan [name] preventative maintenance and recommended screening tests for an annual wellness exam were reviewed in full today. Brought up to date unless services declined.  Counselled on the importance of diet, exercise, and its role in overall health and mortality. [name] FH and SH was reviewed, including their home life, tobacco status, and drug and alcohol status.   Vaccines:  Uptodate pneumovax/prevnar and  shingles x 2  ,tdap, COVID x 2 Prostate: Father with prostate cancer age 24s.   Discussed in detail, have chose to check PSA and rectal exam yearly.  PSA elevated 11/2022, but on recheck back at baseline 2.9 05/2023 Lab Results  Component Value Date   PSA 5.38 (H) 12/09/2022   PSA 2.06 12/04/2021   PSA 2.07 12/01/2020  Colon: Sister with colon cancer [facility], 07/2019, no further needed given age. nonsmoker Hep C screening: done   Problem List Items Addressed This Visit     AAA (abdominal aortic aneurysm) without rupture (HCC)   Stable  2023, no further imaging recommended at this time per  cardiology.  Dr. Lavona.      HYPERCHOLESTEROLEMIA   Stable, chronic.  Continue current medication.   Pravastatin  20 mg daily      HYPERTENSION, MILD - Primary   Stable, chronic.  Continue current medication.   Losartan  50 mg p.o. daily      Obesity, morbid (HCC)   Encouraged exercise, weight loss, healthy eating habits.        Primary lateral sclerosis (HCC)   ALS variant, rare   Progressive, significantly limiting ambulation and balance.  Using cane and sometimes scooter.  Follow[facility] at [facility] now helping with ADLs he cannot perform... not currently interested in care aid, etc. [facility].          Greig Ring, MD

## 2023-12-16 NOTE — Assessment & Plan Note (Addendum)
 Stable, chronic.  Continue current medication.  HDL low and triglycerides higher.. discuseed low carb diet , low fat diet and weight loss.  Pravastatin  20 mg daily

## 2023-12-16 NOTE — Assessment & Plan Note (Signed)
 Encouraged exercise, weight loss, healthy eating habits. ? ?

## 2023-12-16 NOTE — Assessment & Plan Note (Signed)
 Stable  2023, no further imaging recommended at this time per  cardiology.  Dr. Lavona.

## 2023-12-16 NOTE — Assessment & Plan Note (Addendum)
 ALS variant, rare   Progressive, significantly limiting ambulation and balance.  Using cane and sometimes scooter.  Followed at [facility] now helping with ADLs he cannot perform... not currently interested in care aid, etc. [facility].

## 2024-12-09 ENCOUNTER — Other Ambulatory Visit

## 2024-12-15 ENCOUNTER — Ambulatory Visit

## 2024-12-16 ENCOUNTER — Encounter: Admitting: Family Medicine
